# Patient Record
Sex: Female | Born: 1990 | Race: Black or African American | Hispanic: No | Marital: Single
Health system: Midwestern US, Community
[De-identification: ages and names within clinical notes are randomized; demographics above are authoritative.]

## PROBLEM LIST (undated history)

## (undated) DIAGNOSIS — F909 Attention-deficit hyperactivity disorder, unspecified type: Secondary | ICD-10-CM

## (undated) DIAGNOSIS — F609 Personality disorder, unspecified: Secondary | ICD-10-CM

## (undated) DIAGNOSIS — K5900 Constipation, unspecified: Secondary | ICD-10-CM

## (undated) DIAGNOSIS — F329 Major depressive disorder, single episode, unspecified: Secondary | ICD-10-CM

## (undated) DIAGNOSIS — F419 Anxiety disorder, unspecified: Secondary | ICD-10-CM

## (undated) DIAGNOSIS — F1910 Other psychoactive substance abuse, uncomplicated: Secondary | ICD-10-CM

## (undated) DIAGNOSIS — F99 Mental disorder, not otherwise specified: Secondary | ICD-10-CM

## (undated) DIAGNOSIS — F32A Depression, unspecified: Secondary | ICD-10-CM

## (undated) HISTORY — PX: HERNIA REPAIR: SHX51

## (undated) MED FILL — M-NATAL PLUS 27-1MG TABS: 27-1 MG | 30 days supply | Qty: 30 | Fill #0 | Status: AC

---

## 2012-05-10 DIAGNOSIS — O0990 Supervision of high risk pregnancy, unspecified, unspecified trimester: Secondary | ICD-10-CM | POA: Insufficient documentation

## 2012-05-17 DIAGNOSIS — O34599 Maternal care for other abnormalities of gravid uterus, unspecified trimester: Secondary | ICD-10-CM | POA: Insufficient documentation

## 2012-05-17 DIAGNOSIS — O99340 Other mental disorders complicating pregnancy, unspecified trimester: Secondary | ICD-10-CM | POA: Insufficient documentation

## 2012-05-17 DIAGNOSIS — Z98891 History of uterine scar from previous surgery: Secondary | ICD-10-CM | POA: Insufficient documentation

## 2012-05-17 DIAGNOSIS — O289 Unspecified abnormal findings on antenatal screening of mother: Secondary | ICD-10-CM | POA: Insufficient documentation

## 2012-05-17 DIAGNOSIS — Z8759 Personal history of other complications of pregnancy, childbirth and the puerperium: Secondary | ICD-10-CM | POA: Insufficient documentation

## 2012-08-29 DIAGNOSIS — F1410 Cocaine abuse, uncomplicated: Secondary | ICD-10-CM | POA: Insufficient documentation

## 2013-05-14 DIAGNOSIS — F4312 Post-traumatic stress disorder, chronic: Secondary | ICD-10-CM | POA: Insufficient documentation

## 2013-05-21 DIAGNOSIS — O99019 Anemia complicating pregnancy, unspecified trimester: Secondary | ICD-10-CM | POA: Insufficient documentation

## 2013-05-27 DIAGNOSIS — R87613 High grade squamous intraepithelial lesion on cytologic smear of cervix (HGSIL): Secondary | ICD-10-CM | POA: Insufficient documentation

## 2013-09-26 DIAGNOSIS — O289 Unspecified abnormal findings on antenatal screening of mother: Secondary | ICD-10-CM | POA: Insufficient documentation

## 2013-09-26 DIAGNOSIS — O0930 Supervision of pregnancy with insufficient antenatal care, unspecified trimester: Secondary | ICD-10-CM | POA: Insufficient documentation

## 2013-12-03 DIAGNOSIS — F319 Bipolar disorder, unspecified: Secondary | ICD-10-CM | POA: Insufficient documentation

## 2013-12-09 DIAGNOSIS — F332 Major depressive disorder, recurrent severe without psychotic features: Secondary | ICD-10-CM | POA: Insufficient documentation

## 2014-04-14 DIAGNOSIS — F1920 Other psychoactive substance dependence, uncomplicated: Secondary | ICD-10-CM | POA: Insufficient documentation

## 2014-05-26 ENCOUNTER — Emergency Department (HOSPITAL_COMMUNITY)
Admission: EM | Admit: 2014-05-26 | Discharge: 2014-05-26 | Disposition: A | Discharge status: Home / Self Care | Payer: Medicaid - Out of State | Attending: Emergency Medicine | Admitting: Emergency Medicine

## 2014-05-26 ENCOUNTER — Encounter (HOSPITAL_COMMUNITY): Payer: Self-pay | Admitting: Emergency Medicine

## 2014-05-26 ENCOUNTER — Emergency Department (HOSPITAL_COMMUNITY): Payer: Medicaid - Out of State

## 2014-05-26 DIAGNOSIS — H6693 Otitis media, unspecified, bilateral: Secondary | ICD-10-CM | POA: Diagnosis not present

## 2014-05-26 DIAGNOSIS — H9203 Otalgia, bilateral: Secondary | ICD-10-CM | POA: Diagnosis present

## 2014-05-26 DIAGNOSIS — J069 Acute upper respiratory infection, unspecified: Secondary | ICD-10-CM | POA: Diagnosis not present

## 2014-05-26 DIAGNOSIS — Z91040 Latex allergy status: Secondary | ICD-10-CM | POA: Insufficient documentation

## 2014-05-26 DIAGNOSIS — R058 Other specified cough: Secondary | ICD-10-CM

## 2014-05-26 DIAGNOSIS — Z720 Tobacco use: Secondary | ICD-10-CM | POA: Diagnosis not present

## 2014-05-26 DIAGNOSIS — R05 Cough: Secondary | ICD-10-CM

## 2014-05-26 MED ORDER — AMOXICILLIN-POT CLAVULANATE 875-125 MG PO TABS
1.0000 | ORAL_TABLET | Freq: Once | ORAL | Status: AC
  Administered 2014-05-26: 1 via ORAL
  Filled 2014-05-26: qty 1

## 2014-05-26 MED ORDER — ANTIPYRINE-BENZOCAINE 5.4-1.4 % OT SOLN
3.0000 [drp] | Freq: Once | OTIC | Status: AC
  Administered 2014-05-26: 3 [drp] via OTIC
  Filled 2014-05-26: qty 10

## 2014-05-26 MED ORDER — HYDROCODONE-ACETAMINOPHEN 5-325 MG PO TABS
2.0000 | ORAL_TABLET | Freq: Once | ORAL | Status: AC
  Administered 2014-05-26: 2 via ORAL
  Filled 2014-05-26: qty 2

## 2014-05-26 MED ORDER — AMOXICILLIN-POT CLAVULANATE 875-125 MG PO TABS: 1.0000 | ORAL_TABLET | Freq: Two times a day (BID) | ORAL | Status: DC

## 2014-05-26 MED ORDER — ANTIPYRINE-BENZOCAINE 5.4-1.4 % OT SOLN: 3.0000 [drp] | OTIC | Status: DC | PRN

## 2014-05-26 NOTE — ED Provider Notes (Signed)
CSN: [id number]     Arrival date & time 05/26/14  1708 History   First MD Initiated Contact with Patient 05/26/14 1817     Chief Complaint  Patient presents with  . Otalgia     (Consider location/radiation/quality/duration/timing/severity/associated sxs/prior Treatment) HPI  [name] is a 23 y.o. female without significant PMH presenting with 5 day history of cough with thick productive green mucous, rhinorrhea, sinus congestion, tactile fevers and chills with complaint of bilateral ear pain for the past three days. Pain worse in the left ear. Patient denies using qtips or anything in ear. Patient noticed green discharge from left ear yesterday. Pt denies taking anything for the pain. Pt is current smoker and denies history of asthma, COPD, DM or other medical problems. No chest pain or shortness of breath. No recent abx use or chronic ear infections.   History reviewed. No pertinent past medical history. History reviewed. No pertinent past surgical history. History reviewed. No pertinent family history. History  Substance Use Topics  . Smoking status: Current Every Day Smoker  . Smokeless tobacco: Not on file  . Alcohol Use: Yes   OB History    No data available     Review of Systems  Constitutional: Negative for fever and chills.  HENT: Positive for congestion, ear discharge, ear pain and rhinorrhea. Negative for sore throat.   Eyes: Negative for visual disturbance.  Respiratory: Positive for cough. Negative for shortness of breath.   Cardiovascular: Negative for chest pain and palpitations.  Gastrointestinal: Negative for nausea, vomiting and diarrhea.  Musculoskeletal: Negative for back pain and gait problem.  Skin: Negative for rash.  Neurological: Negative for weakness and headaches.      Allergies  Latex  Home Medications   Prior to Admission medications   Medication Sig Start Date End Date Taking? Authorizing Provider  amoxicillin-clavulanate  (AUGMENTIN) 875-125 MG per tablet Take 1 tablet by mouth every 12 (twelve) hours. 05/26/14   Louann SjogrenVictoria L Anamika Kueker, PA-C  antipyrine-benzocaine Lyla Son(AURALGAN) otic solution Place 3 drops into the right ear every 2 (two) hours as needed. 05/26/14   Benetta SparVictoria L Leocadio Heal, PA-C   BP 139/100 mmHg  Pulse 67  Temp(Src) 97.9 F (36.6 C) (Oral)  Resp 22  Ht 5\' 5"  (1.651 m)  Wt 125 lb (56.7 kg)  BMI 20.80 kg/m2  SpO2 100% Physical Exam  Constitutional: She appears well-developed and well-nourished. No distress.  HENT:  Head: Normocephalic and atraumatic.  Right Ear: External ear normal. No drainage. Tympanic membrane is injected, erythematous and bulging. Tympanic membrane is not perforated.  Left Ear: External ear normal. No drainage. Tympanic membrane is injected, erythematous and bulging. Tympanic membrane is not perforated.  Nose: Right sinus exhibits no maxillary sinus tenderness and no frontal sinus tenderness. Left sinus exhibits no maxillary sinus tenderness and no frontal sinus tenderness.  Mouth/Throat: Mucous membranes are normal. Posterior oropharyngeal erythema present. No oropharyngeal exudate or posterior oropharyngeal edema.  Tenderness to left outer ear, tragus. No tenderness to mastoid, inflammation or erythema. Bilateral ear canals with significant erythema.   Eyes: Conjunctivae and EOM are normal. Right eye exhibits no discharge. Left eye exhibits no discharge.  Neck: Normal range of motion. Neck supple.  No nuchal rigidity.  Cardiovascular: Normal rate, regular rhythm and normal heart sounds.   Pulmonary/Chest: Effort normal and breath sounds normal. No respiratory distress. She has no wheezes. She has no rales.  Abdominal: Soft. Bowel sounds are normal. She exhibits no distension. There is no tenderness.  Lymphadenopathy:  She has cervical adenopathy.  Neurological: She is alert.  Skin: Skin is warm and dry. She is not diaphoretic.  Nursing note and vitals reviewed.   ED Course   Procedures (including critical care time) Labs Review Labs Reviewed - No data to display  Imaging Review Dg Chest 2 View  05/26/2014   CLINICAL DATA:  Productive cough. Bilateral ear pain. Symptoms for 3 days.  EXAM: CHEST  2 VIEW  COMPARISON:  None.  FINDINGS: Normal heart, mediastinum and hila. Clear lungs. No pleural effusion or pneumothorax. Normal bony thorax and soft tissues.  IMPRESSION: Normal chest radiographs.   Electronically Signed   By: Amie Portlandavid  Ormond M.D.   On: 05/26/2014 19:45     EKG Interpretation None      Meds given in ED:  Medications  HYDROcodone-acetaminophen (NORCO/VICODIN) 5-325 MG per tablet 2 tablet (2 tablets Oral Given 05/26/14 1856)  amoxicillin-clavulanate (AUGMENTIN) 875-125 MG per tablet 1 tablet (1 tablet Oral Given 05/26/14 1955)  antipyrine-benzocaine (AURALGAN) otic solution 3-4 drop (3 drops Both Ears Given 05/26/14 1955)    Discharge Medication List as of 05/26/2014  8:35 PM    START taking these medications   Details  amoxicillin-clavulanate (AUGMENTIN) 875-125 MG per tablet Take 1 tablet by mouth every 12 (twelve) hours., Starting 05/26/2014, Until Discontinued, Print    antipyrine-benzocaine Lyla Son(AURALGAN) otic solution Place 3 drops into the right ear every 2 (two) hours as needed., Starting 05/26/2014, Until Discontinued, Print          MDM   Final diagnoses:  Productive cough  Acute bacterial otitis media, bilateral  URI (upper respiratory infection)   Pt with productive cough with lungs CTAB, pt afebrile and CXR without infiltrates. I suspect viral etiology of cough. Pt will be discharged with symptomatic treatment.   Patient presents with otalgia and exam consistent with acute otitis media. No concern for acute mastoiditis, meningitis.  No antibiotic use in the last month.  Patient discharged home with augmentin. Pt without a PCP. Pt to follow up with the wellness center in 2-3 days. I have also discussed reasons to return immediately to  the ER.  Parent expresses understanding and agrees with plan.  Discussed return precautions with patient. Discussed all results and patient verbalizes understanding and agrees with plan.  This is a shared patient. This patient was discussed with the physician, Dr. Loretha StaplerWofford who saw and evaluated the patient and agrees with the plan.         Louann SjogrenVictoria L Prentice Sackrider, PA-C 05/27/14 0040  Louann SjogrenVictoria L Alastor Kneale, PA-C 05/27/14 [phone]

## 2014-05-26 NOTE — Discharge Instructions (Signed)
Return to the emergency room with worsening of symptoms, new symptoms or with symptoms that are concerning, especially severe worsening of headache, stiff neck, visual or speech changes, weakness in face, arms or legs OR swelling, redness or pain around your ear.  Please take all of your antibiotics until finished!   You may develop abdominal discomfort or diarrhea from the antibiotic.  You may help offset this with probiotics which you can buy or get in yogurt. Do not eat  or take the probiotics until 2 hours after your antibiotic.  Drink plenty of fluids with electrolytes especially Gatorade. OTC cold medications such as mucinex, nyquil, dayquil are recommended. Chloraseptic for sore throat.

## 2014-05-26 NOTE — ED Notes (Signed)
Pt A&OX4, ambulatory at d/c with steady gait, NAD 

## 2014-05-26 NOTE — ED Notes (Signed)
Pt c/o bilateral ear pain x 3 days.  

## 2014-05-27 NOTE — ED Provider Notes (Signed)
Medical screening examination/treatment/procedure(s) were conducted as a shared visit with non-physician practitioner(s) and myself.  I personally evaluated the patient during the encounter.   EKG Interpretation None      23 yo female presenting with cough, URI symptoms, left ear pain.  On exam, well appearing, nontoxic, not distressed, normal respiratory effort, normal perfusion, right ear canal clear, mild right middle ear effusion, left ear canal clear, severe left middle ear effusion with TM bulging, no mastoid tenderness.  Plan symptomatic control and abx for AOM.    Clinical Impression: 1. Acute bacterial otitis media, bilateral   2. Productive cough   3. URI (upper respiratory infection)       [name] Olivia Graber, MD 05/27/14 1626

## 2014-06-25 ENCOUNTER — Inpatient Hospital Stay (HOSPITAL_COMMUNITY)
Admission: AD | Admit: 2014-06-25 | Discharge: 2014-06-25 | Disposition: A | Discharge status: Home / Self Care | Payer: Medicaid - Out of State | Source: Ambulatory Visit | Attending: Obstetrics & Gynecology | Admitting: Obstetrics & Gynecology

## 2014-06-25 ENCOUNTER — Encounter (HOSPITAL_COMMUNITY): Payer: Self-pay | Admitting: *Deleted

## 2014-06-25 DIAGNOSIS — K648 Other hemorrhoids: Secondary | ICD-10-CM | POA: Insufficient documentation

## 2014-06-25 DIAGNOSIS — K5900 Constipation, unspecified: Secondary | ICD-10-CM | POA: Insufficient documentation

## 2014-06-25 DIAGNOSIS — F17210 Nicotine dependence, cigarettes, uncomplicated: Secondary | ICD-10-CM | POA: Insufficient documentation

## 2014-06-25 HISTORY — DX: Anxiety disorder, unspecified: F41.9

## 2014-06-25 HISTORY — DX: Depression, unspecified: F32.A

## 2014-06-25 HISTORY — DX: Personality disorder, unspecified: F60.9

## 2014-06-25 HISTORY — DX: Attention-deficit hyperactivity disorder, unspecified type: F90.9

## 2014-06-25 HISTORY — DX: Constipation, unspecified: K59.00

## 2014-06-25 HISTORY — DX: Other psychoactive substance abuse, uncomplicated: F19.10

## 2014-06-25 HISTORY — DX: Major depressive disorder, single episode, unspecified: F32.9

## 2014-06-25 HISTORY — DX: Mental disorder, not otherwise specified: F99

## 2014-06-25 LAB — URINALYSIS, ROUTINE W REFLEX MICROSCOPIC
Bilirubin Urine: NEGATIVE
GLUCOSE, UA: NEGATIVE mg/dL
Hgb urine dipstick: NEGATIVE
Ketones, ur: NEGATIVE mg/dL
LEUKOCYTES UA: NEGATIVE
Nitrite: NEGATIVE
PROTEIN: NEGATIVE mg/dL
Specific Gravity, Urine: >1.03 — ABNORMAL HIGH (ref 1.005–1.030)
UROBILINOGEN UA: 0.2 mg/dL (ref 0.0–1.0)
pH: 5.5 (ref 5.0–8.0)

## 2014-06-25 LAB — CBC
HCT: 37.1 % (ref 36.0–46.0)
Hemoglobin: 12.2 g/dL (ref 12.0–15.0)
MCH: 28.4 pg (ref 26.0–34.0)
MCHC: 32.9 g/dL (ref 30.0–36.0)
MCV: 86.5 fL (ref 78.0–100.0)
Platelets: 341 10*3/uL (ref 150–400)
RBC: 4.29 MIL/uL (ref 3.87–5.11)
RDW: 14.3 % (ref 11.5–15.5)
WBC: 5.5 10*3/uL (ref 4.0–10.5)

## 2014-06-25 LAB — POCT PREGNANCY, URINE: Preg Test, Ur: NEGATIVE

## 2014-06-25 NOTE — MAU Note (Signed)
Patient states she has had pain in the left mid abdomen for about 4 days. Has been constipated for 4 days and only hard pebble size stool. States she had bright red rectal bleeding when trying to have a BM. States bleeding continues when she strains.

## 2014-06-25 NOTE — MAU Provider Note (Signed)
History     CSN: [id number]  Arrival date and time: 06/25/14 1513   First Provider Initiated Contact with Patient 06/25/14 1658      Chief Complaint  Patient presents with  . Abdominal Pain  . Rectal Bleeding   HPI    [name] is a 23 y.o. female [phone] who presents to MAU for constipation and rectal bleeding. She had a BM four days ago and it was soft, she is concerned because she has not been able to go since then.  When she used the bathroom today she saw blood after she wiped from her rectum.  She drinks sprite all day every day, she does not drink water.   OB History    Gravida Para Term Preterm AB TAB SAB Ectopic Multiple Living   3 3  3      3       Past Medical History  Diagnosis Date  . Constipation   . Anxiety   . Mental disorder   . ADHD (attention deficit hyperactivity disorder)   . Personality disorder   . Depression   . Anxiety disorder   . Substance abuse     Past Surgical History  Procedure Laterality Date  . Cesarean section    . Hernia repair      Family History  Problem Relation Age of Onset  . Alcohol abuse Neg Hx   . Arthritis Neg Hx   . Asthma Neg Hx   . Birth defects Neg Hx   . Cancer Neg Hx   . COPD Neg Hx   . Depression Neg Hx   . Diabetes Neg Hx   . Drug abuse Neg Hx   . Early death Neg Hx   . Hearing loss Neg Hx   . Heart disease Neg Hx   . Hyperlipidemia Neg Hx   . Hypertension Neg Hx   . Kidney disease Neg Hx   . Learning disabilities Neg Hx   . Mental illness Neg Hx   . Mental retardation Neg Hx   . Miscarriages / Stillbirths Neg Hx   . Stroke Neg Hx   . Vision loss Neg Hx   . Varicose Veins Neg Hx     History  Substance Use Topics  . Smoking status: Current Every Day Smoker  . Smokeless tobacco: Not on file  . Alcohol Use: Yes    Allergies:  Allergies  Allergen Reactions  . Latex Hives    Prescriptions prior to admission  Medication Sig Dispense Refill Last Dose  . etonogestrel (NEXPLANON)  68 MG IMPL implant 1 each by Subdermal route once.   06/25/2014 at Unknown time  . traZODone (DESYREL) 100 MG tablet Take 100 mg by mouth at bedtime.   06/24/2014 at Unknown time  . amoxicillin-clavulanate (AUGMENTIN) 875-125 MG per tablet Take 1 tablet by mouth every 12 (twelve) hours. (Patient not taking: Reported on 06/25/2014) 20 tablet 0   . antipyrine-benzocaine (AURALGAN) otic solution Place 3 drops into the right ear every 2 (two) hours as needed. (Patient not taking: Reported on 06/25/2014) 10 mL 0    Results for orders placed or performed during the hospital encounter of 06/25/14 (from the past 48 hour(s))  Pregnancy, urine POC     Status: None   Collection Time: 06/25/14  4:13 PM  Result Value Ref Range   Preg Test, Ur NEGATIVE NEGATIVE    Comment:        THE SENSITIVITY OF THIS METHODOLOGY IS >24 mIU/mL  Urinalysis, Routine w reflex microscopic     Status: Abnormal   Collection Time: 06/25/14  4:15 PM  Result Value Ref Range   Color, Urine YELLOW YELLOW   APPearance HAZY (A) CLEAR   Specific Gravity, Urine >1.030 (H) 1.005 - 1.030   pH 5.5 5.0 - 8.0   Glucose, UA NEGATIVE NEGATIVE mg/dL   Hgb urine dipstick NEGATIVE NEGATIVE   Bilirubin Urine NEGATIVE NEGATIVE   Ketones, ur NEGATIVE NEGATIVE mg/dL   Protein, ur NEGATIVE NEGATIVE mg/dL   Urobilinogen, UA 0.2 0.0 - 1.0 mg/dL   Nitrite NEGATIVE NEGATIVE   Leukocytes, UA NEGATIVE NEGATIVE    Comment: MICROSCOPIC NOT DONE ON URINES WITH NEGATIVE PROTEIN, BLOOD, LEUKOCYTES, NITRITE, OR GLUCOSE <1000 mg/dL.  CBC     Status: None   Collection Time: 06/25/14  5:26 PM  Result Value Ref Range   WBC 5.5 4.0 - 10.5 K/uL   RBC 4.29 3.87 - 5.11 MIL/uL   Hemoglobin 12.2 12.0 - 15.0 g/dL   HCT 16.137.1 09.636.0 - 04.546.0 %   MCV 86.5 78.0 - 100.0 fL   MCH 28.4 26.0 - 34.0 pg   MCHC 32.9 30.0 - 36.0 g/dL   RDW 40.914.3 81.111.5 - 91.415.5 %   Platelets 341 150 - 400 K/uL    Review of Systems  Constitutional: Negative for fever and chills.   Gastrointestinal: Positive for constipation and blood in stool. Negative for nausea, vomiting and abdominal pain.  Genitourinary: Negative for dysuria, urgency, frequency and hematuria.   Physical Exam   Blood pressure 118/66, pulse 90, temperature 98.1 F (36.7 C), temperature source Oral, resp. rate 16, height 5\' 6"  (1.676 m), weight 51.619 kg (113 lb 12.8 oz), SpO2 99 %.  Physical Exam  Constitutional: She is oriented to person, place, and time. She appears well-developed and well-nourished. No distress.  HENT:  Head: Normocephalic.  Neck: Neck supple.  Respiratory: Effort normal.  GI: Soft. She exhibits no distension and no mass. There is no tenderness. There is no rebound and no guarding.  Genitourinary: Rectal exam shows internal hemorrhoid and tenderness. Rectal exam shows no external hemorrhoid and anal tone normal.  No blood noted on exam glove. Large amount of stool palpated   Musculoskeletal: Normal range of motion.  Neurological: She is alert and oriented to person, place, and time.  Skin: Skin is warm. She is not diaphoretic.  Psychiatric: Her behavior is normal.    MAU Course  Procedures  None  MDM UA UPT Rectal exam   Assessment and Plan   A: 1. Constipation, unspecified constipation type   2.      Internal hemorrhoid  P: Discharge home in stable condition Increase PO water intake Increase fiber in diet Over the counter: Miralax, metamucil, colace as directed on the bottle [facility] wor[facility]     Iona HansenJennifer Irene Tippi Mccrae, NP 06/25/2014 7:58 PM

## 2014-06-25 NOTE — Discharge Instructions (Signed)
Constipation °Constipation is when a person: °· Poops (has a bowel movement) less than 3 times a week. °· Has a hard time pooping. °· Has poop that is dry, hard, or bigger than normal. °HOME CARE  °· Eat foods with a lot of fiber in them. This includes fruits, vegetables, beans, and whole grains such as brown rice. °· Avoid fatty foods and foods with a lot of sugar. This includes french fries, hamburgers, cookies, candy, and soda. °· If you are not getting enough fiber from food, take products with added fiber in them (supplements). °· Drink enough fluid to keep your pee (urine) clear or pale yellow. °· Exercise on a regular basis, or as told by your doctor. °· Go to the restroom when you feel like you need to poop. Do not hold it. °· Only take medicine as told by your doctor. Do not take medicines that help you poop (laxatives) without talking to your doctor first. °GET HELP RIGHT AWAY IF:  °· You have bright red blood in your poop (stool). °· Your constipation lasts more than 4 days or gets worse. °· You have belly (abdominal) or butt (rectal) pain. °· You have thin poop (as thin as a pencil). °· You lose weight, and it cannot be explained. °MAKE SURE YOU:  °· Understand these instructions. °· Will watch your condition. °· Will get help right away if you are not doing well or get worse. °Document Released: 12/28/2007 Document Revised: 07/16/2013 Document Reviewed: 04/22/2013 °[employer]® Patient Information ©2015 [employer]. This information is not intended to replace advice given to you by your health care provider. Make sure you discuss any questions you have with your health care provider. ° °Anal Fissure, Adult °An anal fissure is a small tear or crack in the skin around the opening of the butt (anus). Bleeding from the tear or crack usually stops on its own within a few minutes. The bleeding may happen every time you poop until the tear or crack heals.  °HOME CARE °· Eat lots of fruit, whole grains, and  vegetables. Avoid foods like bananas and dairy products. These foods can make it hard to poop. °· Take a warm water bath (sitz bath) as told by your doctor. °· Drink enough fluids to keep your pee (urine) clear or pale yellow. °· Only take medicines as told by your doctor. Do not take aspirin. °· Do not use numbing creams or hydrocortisone cream on the area. These creams can slow healing. °GET HELP RIGHT AWAY IF: °· Your tear or crack is not healed in 3 days. °· You have more bleeding. °· You have a fever. °· You have watery poop (diarrhea) mixed with blood. °· You have pain. °· You are getting worse, not better. °MAKE SURE YOU:  °· Understand these instructions. °· Will watch your condition. °· Will get help right away if you are not doing well or get worse. °Document Released: 03/09/2011 Document Revised: 10/03/2011 Document Reviewed: 03/09/2011 °[employer]® Patient Information ©2015 [employer]. This information is not intended to replace advice given to you by your health care provider. Make sure you discuss any questions you have with your health care provider. ° °

## 2014-07-05 ENCOUNTER — Emergency Department (HOSPITAL_COMMUNITY): Payer: Medicaid - Out of State

## 2014-07-05 ENCOUNTER — Encounter (HOSPITAL_COMMUNITY): Payer: Self-pay | Admitting: Emergency Medicine

## 2014-07-05 ENCOUNTER — Observation Stay (HOSPITAL_COMMUNITY)
Admission: EM | Admit: 2014-07-05 | Discharge: 2014-07-09 | Disposition: A | Discharge status: Home / Self Care | Payer: Medicaid - Out of State | Attending: Internal Medicine | Admitting: Internal Medicine

## 2014-07-05 DIAGNOSIS — F609 Personality disorder, unspecified: Secondary | ICD-10-CM | POA: Insufficient documentation

## 2014-07-05 DIAGNOSIS — F329 Major depressive disorder, single episode, unspecified: Secondary | ICD-10-CM | POA: Insufficient documentation

## 2014-07-05 DIAGNOSIS — K6289 Other specified diseases of anus and rectum: Secondary | ICD-10-CM | POA: Insufficient documentation

## 2014-07-05 DIAGNOSIS — R1030 Lower abdominal pain, unspecified: Secondary | ICD-10-CM | POA: Diagnosis present

## 2014-07-05 DIAGNOSIS — K625 Hemorrhage of anus and rectum: Secondary | ICD-10-CM | POA: Diagnosis present

## 2014-07-05 DIAGNOSIS — K626 Ulcer of anus and rectum: Secondary | ICD-10-CM | POA: Diagnosis not present

## 2014-07-05 DIAGNOSIS — K922 Gastrointestinal hemorrhage, unspecified: Principal | ICD-10-CM | POA: Diagnosis present

## 2014-07-05 DIAGNOSIS — F17200 Nicotine dependence, unspecified, uncomplicated: Secondary | ICD-10-CM | POA: Insufficient documentation

## 2014-07-05 DIAGNOSIS — K5900 Constipation, unspecified: Secondary | ICD-10-CM | POA: Diagnosis present

## 2014-07-05 DIAGNOSIS — F419 Anxiety disorder, unspecified: Secondary | ICD-10-CM | POA: Insufficient documentation

## 2014-07-05 DIAGNOSIS — K921 Melena: Secondary | ICD-10-CM | POA: Insufficient documentation

## 2014-07-05 DIAGNOSIS — N390 Urinary tract infection, site not specified: Secondary | ICD-10-CM | POA: Diagnosis not present

## 2014-07-05 DIAGNOSIS — D62 Acute posthemorrhagic anemia: Secondary | ICD-10-CM | POA: Diagnosis present

## 2014-07-05 DIAGNOSIS — E876 Hypokalemia: Secondary | ICD-10-CM | POA: Insufficient documentation

## 2014-07-05 DIAGNOSIS — R109 Unspecified abdominal pain: Secondary | ICD-10-CM

## 2014-07-05 LAB — CBC WITH DIFFERENTIAL/PLATELET
Basophils Absolute: 0.1 10*3/uL (ref 0.0–0.1)
Basophils Relative: 1 % (ref 0–1)
Eosinophils Absolute: 0.2 10*3/uL (ref 0.0–0.7)
Eosinophils Relative: 2 % (ref 0–5)
HCT: 33.5 % — ABNORMAL LOW (ref 36.0–46.0)
HEMOGLOBIN: 10.8 g/dL — AB (ref 12.0–15.0)
LYMPHS ABS: 2.8 10*3/uL (ref 0.7–4.0)
Lymphocytes Relative: 34 % (ref 12–46)
MCH: 27.6 pg (ref 26.0–34.0)
MCHC: 32.2 g/dL (ref 30.0–36.0)
MCV: 85.7 fL (ref 78.0–100.0)
Monocytes Absolute: 0.6 10*3/uL (ref 0.1–1.0)
Monocytes Relative: 7 % (ref 3–12)
NEUTROS ABS: 4.7 10*3/uL (ref 1.7–7.7)
Neutrophils Relative %: 56 % (ref 43–77)
PLATELETS: 362 10*3/uL (ref 150–400)
RBC: 3.91 MIL/uL (ref 3.87–5.11)
RDW: 15 % (ref 11.5–15.5)
WBC: 8.3 10*3/uL (ref 4.0–10.5)

## 2014-07-05 LAB — URINALYSIS, ROUTINE W REFLEX MICROSCOPIC
Glucose, UA: NEGATIVE mg/dL
Ketones, ur: 15 mg/dL — AB
Nitrite: NEGATIVE
Protein, ur: 30 mg/dL — AB
Specific Gravity, Urine: 1.03 (ref 1.005–1.030)
UROBILINOGEN UA: 0.2 mg/dL (ref 0.0–1.0)
pH: 5.5 (ref 5.0–8.0)

## 2014-07-05 LAB — COMPREHENSIVE METABOLIC PANEL
ALK PHOS: 109 U/L (ref 39–117)
ALT: 12 U/L (ref 0–35)
AST: 19 U/L (ref 0–37)
Albumin: 3.7 g/dL (ref 3.5–5.2)
Anion gap: 14 (ref 5–15)
BILIRUBIN TOTAL: 0.5 mg/dL (ref 0.3–1.2)
BUN: 7 mg/dL (ref 6–23)
CHLORIDE: 101 meq/L (ref 96–112)
CO2: 24 meq/L (ref 19–32)
Calcium: 9 mg/dL (ref 8.4–10.5)
Creatinine, Ser: 0.78 mg/dL (ref 0.50–1.10)
GLUCOSE: 90 mg/dL (ref 70–99)
POTASSIUM: 4 meq/L (ref 3.7–5.3)
SODIUM: 139 meq/L (ref 137–147)
Total Protein: 7.3 g/dL (ref 6.0–8.3)

## 2014-07-05 LAB — URINE MICROSCOPIC-ADD ON

## 2014-07-05 LAB — POC URINE PREG, ED: Preg Test, Ur: NEGATIVE

## 2014-07-05 LAB — POC OCCULT BLOOD, ED: FECAL OCCULT BLD: POSITIVE — AB

## 2014-07-05 MED ORDER — SODIUM CHLORIDE 0.9 % IV SOLN
INTRAVENOUS | Status: DC
  Administered 2014-07-06 (×2): via INTRAVENOUS

## 2014-07-05 MED ORDER — HYDROCODONE-ACETAMINOPHEN 5-325 MG PO TABS
1.0000 | ORAL_TABLET | Freq: Four times a day (QID) | ORAL | Status: DC | PRN
  Administered 2014-07-05 – 2014-07-06 (×2): 1 via ORAL
  Filled 2014-07-05 (×2): qty 1

## 2014-07-05 MED ORDER — TRAZODONE HCL 100 MG PO TABS
100.0000 mg | ORAL_TABLET | Freq: Every day | ORAL | Status: DC
  Administered 2014-07-06 – 2014-07-08 (×3): 100 mg via ORAL
  Filled 2014-07-05 (×4): qty 1

## 2014-07-05 MED ORDER — SODIUM CHLORIDE 0.9 % IJ SOLN: 3.0000 mL | Freq: Two times a day (BID) | INTRAMUSCULAR | Status: DC

## 2014-07-05 MED ORDER — HYDROMORPHONE HCL 1 MG/ML IJ SOLN
1.0000 mg | Freq: Once | INTRAMUSCULAR | Status: AC
  Administered 2014-07-05: 1 mg via INTRAVENOUS
  Filled 2014-07-05: qty 1

## 2014-07-05 MED ORDER — ONDANSETRON HCL 4 MG/2ML IJ SOLN
4.0000 mg | Freq: Once | INTRAMUSCULAR | Status: AC
  Administered 2014-07-05: 4 mg via INTRAVENOUS
  Filled 2014-07-05: qty 2

## 2014-07-05 MED ORDER — IOHEXOL 300 MG/ML  SOLN
100.0000 mL | Freq: Once | INTRAMUSCULAR | Status: AC | PRN
  Administered 2014-07-05: 100 mL via INTRAVENOUS

## 2014-07-05 MED ORDER — SODIUM CHLORIDE 0.9 % IV SOLN: INTRAVENOUS | Status: AC

## 2014-07-05 NOTE — ED Notes (Signed)
Pt ambulates with ease in the room.

## 2014-07-05 NOTE — ED Notes (Signed)
Pt c/o lower abd pain x several days with possible dark red stool x 1 episode last night

## 2014-07-05 NOTE — ED Provider Notes (Addendum)
CSN: [id number]     Arrival date & time 07/05/14  1351 History   First MD Initiated Contact with Patient 07/05/14 1639     Chief Complaint  Patient presents with  . Abdominal Pain     (Consider location/radiation/quality/duration/timing/severity/associated sxs/prior Treatment) Patient is a 23 y.o. female presenting with abdominal pain. The history is provided by the patient.  Abdominal Pain Associated symptoms: no chest pain, no chills, no cough, no diarrhea, no fever, no shortness of breath, no vaginal bleeding, no vaginal discharge and no vomiting   pt w hx hemorrhoids, c/o rectal bleeding intermittently in the past 1-2 weeks. Episodic. States bms will be from bright red to very dark. States also thinks she may have passed some clots and/or bloody tissue per rectum. No vaginal bleeding. Unsure of lnmp since implanted birth control approximately 1 year ago. Pt c/o left lower abdominal pain, crampy, moderate. No hx same.  Normal appetite. No nv. No hx pud, diverticula, avm, or other cause of rectal bleeding. No recent rectal trauma. Denies fever or chills. No diarrhea. No other abn bleeding or bruising. No syncope, no faintness or dizziness.       Past Medical History  Diagnosis Date  . Constipation   . Anxiety   . Mental disorder   . ADHD (attention deficit hyperactivity disorder)   . Personality disorder   . Depression   . Anxiety disorder   . Substance abuse    Past Surgical History  Procedure Laterality Date  . Cesarean section    . Hernia repair     Family History  Problem Relation Age of Onset  . Alcohol abuse Neg Hx   . Arthritis Neg Hx   . Asthma Neg Hx   . Birth defects Neg Hx   . Cancer Neg Hx   . COPD Neg Hx   . Depression Neg Hx   . Diabetes Neg Hx   . Drug abuse Neg Hx   . Early death Neg Hx   . Hearing loss Neg Hx   . Heart disease Neg Hx   . Hyperlipidemia Neg Hx   . Hypertension Neg Hx   . Kidney disease Neg Hx   . Learning disabilities Neg Hx   .  Mental illness Neg Hx   . Mental retardation Neg Hx   . Miscarriages / Stillbirths Neg Hx   . Stroke Neg Hx   . Vision loss Neg Hx   . Varicose Veins Neg Hx    History  Substance Use Topics  . Smoking status: Current Every Day Smoker  . Smokeless tobacco: Not on file  . Alcohol Use: Yes   OB History    Gravida Para Term Preterm AB TAB SAB Ectopic Multiple Living   3 3  3      3      Review of Systems  Constitutional: Negative for fever and chills.  HENT: Negative for nosebleeds.   Eyes: Negative for visual disturbance.  Respiratory: Negative for cough and shortness of breath.   Cardiovascular: Negative for chest pain and leg swelling.  Gastrointestinal: Positive for abdominal pain and blood in stool. Negative for vomiting and diarrhea.  Genitourinary: Negative for flank pain, vaginal bleeding and vaginal discharge.  Musculoskeletal: Negative for back pain and neck pain.  Skin: Negative for rash.  Neurological: Negative for syncope and light-headedness.  Hematological: Does not bruise/bleed easily.  Psychiatric/Behavioral: Negative for confusion.      Allergies  Latex  Home Medications   Prior to Admission  medications   Medication Sig Start Date End Date Taking? Authorizing Provider  etonogestrel (NEXPLANON) 68 MG IMPL implant 1 each by Subdermal route once.    Historical Provider, MD  traZODone (DESYREL) 100 MG tablet Take 100 mg by mouth at bedtime.    Historical Provider, MD   BP 125/75 mmHg  Pulse 83  Temp(Src) 98.3 F (36.8 C) (Oral)  Resp 17  SpO2 100% Physical Exam  Constitutional: She appears well-developed and well-nourished. No distress.  HENT:  Mouth/Throat: Oropharynx is clear and moist.  Eyes: Conjunctivae are normal. No scleral icterus.  Neck: Neck supple. No tracheal deviation present.  Cardiovascular: Normal rate, regular rhythm, normal heart sounds and intact distal pulses.  Exam reveals no gallop and no friction rub.   No murmur  heard. Pulmonary/Chest: Effort normal and breath sounds normal. No respiratory distress.  Abdominal: Soft. Normal appearance and bowel sounds are normal. She exhibits no distension and no mass. There is tenderness. There is no rebound and no guarding.  Moderate left abd pain  Genitourinary:  Rectal, ?internal hem. No stool/impaction, small amt dark blood/heme pos.   Musculoskeletal: She exhibits no edema or tenderness.  Neurological: She is alert.  Skin: Skin is warm and dry. No rash noted. She is not diaphoretic.  Psychiatric: She has a normal mood and affect.  Nursing note and vitals reviewed.   ED Course  Procedures (including critical care time) Labs Review  Results for orders placed or performed during the hospital encounter of 07/05/14  CBC with Differential  Result Value Ref Range   WBC 8.3 4.0 - 10.5 K/uL   RBC 3.91 3.87 - 5.11 MIL/uL   Hemoglobin 10.8 (L) 12.0 - 15.0 g/dL   HCT 16.1 (L) 09.6 - 04.5 %   MCV 85.7 78.0 - 100.0 fL   MCH 27.6 26.0 - 34.0 pg   MCHC 32.2 30.0 - 36.0 g/dL   RDW 40.9 81.1 - 91.4 %   Platelets 362 150 - 400 K/uL   Neutrophils Relative % 56 43 - 77 %   Neutro Abs 4.7 1.7 - 7.7 K/uL   Lymphocytes Relative 34 12 - 46 %   Lymphs Abs 2.8 0.7 - 4.0 K/uL   Monocytes Relative 7 3 - 12 %   Monocytes Absolute 0.6 0.1 - 1.0 K/uL   Eosinophils Relative 2 0 - 5 %   Eosinophils Absolute 0.2 0.0 - 0.7 K/uL   Basophils Relative 1 0 - 1 %   Basophils Absolute 0.1 0.0 - 0.1 K/uL  Comprehensive metabolic panel  Result Value Ref Range   Sodium 139 137 - 147 mEq/L   Potassium 4.0 3.7 - 5.3 mEq/L   Chloride 101 96 - 112 mEq/L   CO2 24 19 - 32 mEq/L   Glucose, Bld 90 70 - 99 mg/dL   BUN 7 6 - 23 mg/dL   Creatinine, Ser 7.82 0.50 - 1.10 mg/dL   Calcium 9.0 8.4 - 95.6 mg/dL   Total Protein 7.3 6.0 - 8.3 g/dL   Albumin 3.7 3.5 - 5.2 g/dL   AST 19 0 - 37 U/L   ALT 12 0 - 35 U/L   Alkaline Phosphatase 109 39 - 117 U/L   Total Bilirubin 0.5 0.3 - 1.2 mg/dL    GFR calc non Af Amer >90 >90 mL/min   GFR calc Af Amer >90 >90 mL/min   Anion gap 14 5 - 15  Urinalysis, Routine w reflex microscopic  Result Value Ref Range   Color,  Urine AMBER (A) YELLOW   APPearance CLOUDY (A) CLEAR   Specific Gravity, Urine 1.030 1.005 - 1.030   pH 5.5 5.0 - 8.0   Glucose, UA NEGATIVE NEGATIVE mg/dL   Hgb urine dipstick TRACE (A) NEGATIVE   Bilirubin Urine SMALL (A) NEGATIVE   Ketones, ur 15 (A) NEGATIVE mg/dL   Protein, ur 30 (A) NEGATIVE mg/dL   Urobilinogen, UA 0.2 0.0 - 1.0 mg/dL   Nitrite NEGATIVE NEGATIVE   Leukocytes, UA MODERATE (A) NEGATIVE  Urine microscopic-add on  Result Value Ref Range   Squamous Epithelial / LPF MANY (A) RARE   WBC, UA 7-10 <3 WBC/hpf   RBC / HPF 0-2 <3 RBC/hpf   Urine-Other MUCOUS PRESENT   POC Urine Pregnancy, ED  (If Pre-menopausal female) - [facility]  Result Value Ref Range   Preg Test, Ur NEGATIVE NEGATIVE  POC occult blood, ED Provider will collect  Result Value Ref Range   Fecal Occult Bld POSITIVE (A) NEGATIVE   Ct Abdomen Pelvis W Contrast  07/05/2014   CLINICAL DATA:  Left lower quadrant pain radiating to right lower quadrant 5 weeks. Vomiting yesterday with bloody stool. Previous hernia repair.  EXAM: CT ABDOMEN AND PELVIS WITH CONTRAST  TECHNIQUE: Multidetector CT imaging of the abdomen and pelvis was performed using the standard protocol following bolus administration of intravenous contrast.  CONTRAST:  100mL OMNIPAQUE IOHEXOL 300 MG/ML  SOLN  COMPARISON:  None.  FINDINGS: Lung bases are within normal.  Abdominal images demonstrate a normal liver, spleen, pancreas, gallbladder and adrenal glands. Kidneys are normal in size without hydronephrosis or nephrolithiasis. Appendix is normal. There is no free fluid or focal inflammatory change. Small bowel is unremarkable.  Pelvic images demonstrate the uterus, ovaries and bladder to be within normal. Minimal rectal wall thickening as cannot exclude mild proctitis.  No adjacent inflammatory change or free fluid. 1.6 cm cyst along the posterior lateral left vaginal wall likely a Bartholin's gland cyst.  IMPRESSION: Mild rectal wall thickening which may be due to mild proctitis. No adjacent inflammatory change or free fluid.   Electronically Signed   By: Elberta Fortisaniel  Boyle M.D.   On: 07/05/2014 21:47        MDM   Iv ns. Labs.  Reviewed nursing notes and prior charts for additional history.    Recheck persistent abd pain and tenderness. Will get ct.  On labs, drop in hgb 1.5 gm in past 10 days (hct dec 4).  Pt markedly orthostatic, w drop sbg of 30, and becoming near syncopal.  Additional ivf.  Ct w possible proctitis. ?possible int hem on exam.   Given orthostasis, drop in hgb, heme positive stools, will admit to med service for obs.  Will also consult gi.   Discussed pt with Dr Matthias HughsBuccini - he will see/consult on in AM.  Request npo past mn in event procedure tomorrow.  hospitalist paged for admission.      Suzi RootsKevin E Mercy Malena, MD 07/05/14 [phone]

## 2014-07-05 NOTE — H&P (Signed)
[facility] History and Physical  [name] YNW:[id number] DOB: 06/12/1991 DOA: 07/05/2014  Referring physician: EDP PCP: No primary care provider on file.   Chief Complaint: Rectal bleeding   HPI: [name] is a 23 y.o. female who presents to the ED with c/o dark blood per rectum for the past 10 days.  10 days ago she was seen in the ED for constipation.  She states she may have passed some clots or bloody tissue per rectum.  No vaginal bleeding.  On implanted birth control for past 1 year.  No rectal trauma.  Review of Systems: Systems reviewed.  As above, otherwise negative  Past Medical History  Diagnosis Date  . Constipation   . Anxiety   . Mental disorder   . ADHD (attention deficit hyperactivity disorder)   . Personality disorder   . Depression   . Anxiety disorder   . Substance abuse    Past Surgical History  Procedure Laterality Date  . Cesarean section    . Hernia repair     Social History:  reports that she has been smoking.  She does not have any smokeless tobacco history on file. She reports that she drinks alcohol. She reports that she uses illicit drugs (Cocaine and Marijuana).  Allergies  Allergen Reactions  . Latex Hives    Family History  Problem Relation Age of Onset  . Alcohol abuse Neg Hx   . Arthritis Neg Hx   . Asthma Neg Hx   . Birth defects Neg Hx   . Cancer Neg Hx   . COPD Neg Hx   . Depression Neg Hx   . Diabetes Neg Hx   . Drug abuse Neg Hx   . Early death Neg Hx   . Hearing loss Neg Hx   . Heart disease Neg Hx   . Hyperlipidemia Neg Hx   . Hypertension Neg Hx   . Kidney disease Neg Hx   . Learning disabilities Neg Hx   . Mental illness Neg Hx   . Mental retardation Neg Hx   . Miscarriages / Stillbirths Neg Hx   . Stroke Neg Hx   . Vision loss Neg Hx   . Varicose Veins Neg Hx      Prior to Admission medications   Medication Sig Start Date End Date Taking? Authorizing Provider  traZODone (DESYREL) 100 MG  tablet Take 100 mg by mouth at bedtime.   Yes Historical Provider, MD  etonogestrel (NEXPLANON) 68 MG IMPL implant 1 each by Subdermal route once. 02/2013    Historical Provider, MD   Physical Exam: Filed Vitals:   07/05/14 2100  BP: 127/77  Pulse: 62  Temp:   Resp: 17    BP 127/77 mmHg  Pulse 62  Temp(Src) 99.1 F (37.3 C) (Oral)  Resp 17  SpO2 98%  General Appearance:    Alert, oriented, no distress, appears stated age  Head:    Normocephalic, atraumatic  Eyes:    PERRL, EOMI, sclera non-icteric        Nose:   Nares without drainage or epistaxis. Mucosa, turbinates normal  Throat:   Moist mucous membranes. Oropharynx without erythema or exudate.  Neck:   Supple. No carotid bruits.  No thyromegaly.  No lymphadenopathy.   Back:     No CVA tenderness, no spinal tenderness  Lungs:     Clear to auscultation bilaterally, without wheezes, rhonchi or rales  Chest wall:    No tenderness to palpitation  Heart:  Regular rate and rhythm without murmurs, gallops, rubs  Abdomen:     Soft, non-tender, nondistended, normal bowel sounds, no organomegaly  Genitalia:    deferred  Rectal:    deferred  Extremities:   No clubbing, cyanosis or edema.  Pulses:   2+ and symmetric all extremities  Skin:   Skin color, texture, turgor normal, no rashes or lesions  Lymph nodes:   Cervical, supraclavicular, and axillary nodes normal  Neurologic:   CNII-XII intact. Normal strength, sensation and reflexes      throughout    Labs on Admission:  Basic Metabolic Panel:  Recent Labs Lab 07/05/14 1519  NA 139  K 4.0  CL 101  CO2 24  GLUCOSE 90  BUN 7  CREATININE 0.78  CALCIUM 9.0   Liver Function Tests:  Recent Labs Lab 07/05/14 1519  AST 19  ALT 12  ALKPHOS 109  BILITOT 0.5  PROT 7.3  ALBUMIN 3.7   No results for input(s): LIPASE, AMYLASE in the last 168 hours. No results for input(s): AMMONIA in the last 168 hours. CBC:  Recent Labs Lab 07/05/14 1519  WBC 8.3  NEUTROABS  4.7  HGB 10.8*  HCT 33.5*  MCV 85.7  PLT 362   Cardiac Enzymes: No results for input(s): CKTOTAL, CKMB, CKMBINDEX, TROPONINI in the last 168 hours.  BNP (last 3 results) No results for input(s): PROBNP in the last 8760 hours. CBG: No results for input(s): GLUCAP in the last 168 hours.  Radiological Exams on Admission: Ct Abdomen Pelvis W Contrast  07/05/2014   CLINICAL DATA:  Left lower quadrant pain radiating to right lower quadrant 5 weeks. Vomiting yesterday with bloody stool. Previous hernia repair.  EXAM: CT ABDOMEN AND PELVIS WITH CONTRAST  TECHNIQUE: Multidetector CT imaging of the abdomen and pelvis was performed using the standard protocol following bolus administration of intravenous contrast.  CONTRAST:  100mL OMNIPAQUE IOHEXOL 300 MG/ML  SOLN  COMPARISON:  None.  FINDINGS: Lung bases are within normal.  Abdominal images demonstrate a normal liver, spleen, pancreas, gallbladder and adrenal glands. Kidneys are normal in size without hydronephrosis or nephrolithiasis. Appendix is normal. There is no free fluid or focal inflammatory change. Small bowel is unremarkable.  Pelvic images demonstrate the uterus, ovaries and bladder to be within normal. Minimal rectal wall thickening as cannot exclude mild proctitis. No adjacent inflammatory change or free fluid. 1.6 cm cyst along the posterior lateral left vaginal wall likely a Bartholin's gland cyst.  IMPRESSION: Mild rectal wall thickening which may be due to mild proctitis. No adjacent inflammatory change or free fluid.   Electronically Signed   By: Elberta Fortisaniel  Boyle M.D.   On: 07/05/2014 21:47    EKG: Independently reviewed.  Assessment/Plan Principal Problem:   GI bleed Active Problems:   Acute blood loss anemia   Rectal bleeding   Rectal bleed   1. GI bleed - rectal bleeding, suspect may be related to constipation patient was just treated for 10 days ago, given patients age diverticular bleed or neoplasm seem very unlikely.  Dr.  Matthias HughsBuccini called and will evaluate patient tomorrow, he asks that she be kept NPO.  Will put patient on NS.  Tele monitor, NPO after midnight.    Code Status: Full Code  Family Communication: Husband at bedside Disposition Plan: Admit to obs   Time spent: 70 min  Marilee Ditommaso M. [facility] Pager [phone]  If 7AM-7PM, please contact the day team taking care of the patient [email or web address] Password [email or web address] 07/05/2014, 11:22  PM        

## 2014-07-05 NOTE — ED Notes (Signed)
Pt remains monitored by blood pressure, pulse ox, and 5 lead.  

## 2014-07-05 NOTE — ED Notes (Signed)
Notified CT patient finished contrast.

## 2014-07-05 NOTE — ED Notes (Signed)
Pt was able to complete orthostatic vital signs. Pt stated she felt like she was going to faint while standing.

## 2014-07-05 NOTE — ED Notes (Signed)
Pt reports small amounts of bright red blood in feces beginning about 3 weeks ago (before Thanksgiving).  Reports this morning "looked like I was having a miscarriage".  Pt c/o abdominal pain LLQ and rectal pain 10/10.

## 2014-07-05 NOTE — ED Notes (Signed)
Assisted Dr. Arizona ConstableStienl with rectal exam. Pt remains monitored by blood pressure, pulse ox, and 5 lead. Pt informed that a urine specimen was needed, specimen cup placed at bedside.

## 2014-07-06 ENCOUNTER — Encounter (HOSPITAL_COMMUNITY)
Admission: EM | Disposition: A | Discharge status: Home / Self Care | Payer: Self-pay | Source: Home / Self Care | Attending: Emergency Medicine

## 2014-07-06 ENCOUNTER — Encounter (HOSPITAL_COMMUNITY): Payer: Self-pay | Admitting: Emergency Medicine

## 2014-07-06 DIAGNOSIS — K5909 Other constipation: Secondary | ICD-10-CM | POA: Diagnosis not present

## 2014-07-06 DIAGNOSIS — D62 Acute posthemorrhagic anemia: Secondary | ICD-10-CM

## 2014-07-06 DIAGNOSIS — K626 Ulcer of anus and rectum: Secondary | ICD-10-CM | POA: Diagnosis not present

## 2014-07-06 DIAGNOSIS — K625 Hemorrhage of anus and rectum: Secondary | ICD-10-CM | POA: Diagnosis not present

## 2014-07-06 DIAGNOSIS — K5900 Constipation, unspecified: Secondary | ICD-10-CM | POA: Diagnosis not present

## 2014-07-06 HISTORY — PX: FLEXIBLE SIGMOIDOSCOPY: SHX5431

## 2014-07-06 LAB — CBC
HCT: 28.6 % — ABNORMAL LOW (ref 36.0–46.0)
HCT: 31.4 % — ABNORMAL LOW (ref 36.0–46.0)
HEMOGLOBIN: 9.4 g/dL — AB (ref 12.0–15.0)
Hemoglobin: 10.1 g/dL — ABNORMAL LOW (ref 12.0–15.0)
MCH: 28.3 pg (ref 26.0–34.0)
MCH: 27.9 pg (ref 26.0–34.0)
MCHC: 32.9 g/dL (ref 30.0–36.0)
MCHC: 32.2 g/dL (ref 30.0–36.0)
MCV: 86.1 fL (ref 78.0–100.0)
MCV: 86.7 fL (ref 78.0–100.0)
PLATELETS: 297 10*3/uL (ref 150–400)
Platelets: 288 10*3/uL (ref 150–400)
RBC: 3.32 MIL/uL — ABNORMAL LOW (ref 3.87–5.11)
RBC: 3.62 MIL/uL — ABNORMAL LOW (ref 3.87–5.11)
RDW: 14.8 % (ref 11.5–15.5)
RDW: 14.8 % (ref 11.5–15.5)
WBC: 6.4 10*3/uL (ref 4.0–10.5)
WBC: 5.2 10*3/uL (ref 4.0–10.5)

## 2014-07-06 LAB — BASIC METABOLIC PANEL
Anion gap: 11 (ref 5–15)
BUN: 8 mg/dL (ref 6–23)
CALCIUM: 8.4 mg/dL (ref 8.4–10.5)
CO2: 26 mEq/L (ref 19–32)
Chloride: 100 mEq/L (ref 96–112)
Creatinine, Ser: 0.89 mg/dL (ref 0.50–1.10)
GFR calc Af Amer: >90 mL/min (ref >90)
GFR calc non Af Amer: >90 mL/min (ref >90)
GLUCOSE: 95 mg/dL (ref 70–99)
Potassium: 3.5 mEq/L — ABNORMAL LOW (ref 3.7–5.3)
SODIUM: 137 meq/L (ref 137–147)

## 2014-07-06 LAB — GLUCOSE, CAPILLARY: GLUCOSE-CAPILLARY: 76 mg/dL (ref 70–99)

## 2014-07-06 SURGERY — SIGMOIDOSCOPY, FLEXIBLE
Anesthesia: Moderate Sedation

## 2014-07-06 MED ORDER — DIPHENHYDRAMINE HCL 50 MG/ML IJ SOLN
25.0000 mg | Freq: Once | INTRAMUSCULAR | Status: AC
  Administered 2014-07-06: 25 mg via INTRAVENOUS
  Filled 2014-07-06: qty 1

## 2014-07-06 MED ORDER — POTASSIUM CHLORIDE 10 MEQ/100ML IV SOLN
10.0000 meq | INTRAVENOUS | Status: AC
  Administered 2014-07-06 (×3): 10 meq via INTRAVENOUS
  Filled 2014-07-06 (×3): qty 100

## 2014-07-06 MED ORDER — SODIUM CHLORIDE 0.9 % IV SOLN: INTRAVENOUS | Status: DC

## 2014-07-06 MED ORDER — INFLUENZA VAC SPLIT QUAD 0.5 ML IM SUSY
0.5000 mL | PREFILLED_SYRINGE | INTRAMUSCULAR | Status: AC
  Administered 2014-07-07: 0.5 mL via INTRAMUSCULAR
  Filled 2014-07-06: qty 0.5

## 2014-07-06 MED ORDER — PNEUMOCOCCAL VAC POLYVALENT 25 MCG/0.5ML IJ INJ
0.5000 mL | INJECTION | INTRAMUSCULAR | Status: AC
  Administered 2014-07-07: 0.5 mL via INTRAMUSCULAR
  Filled 2014-07-06: qty 0.5

## 2014-07-06 MED ORDER — HYDROCODONE-ACETAMINOPHEN 5-325 MG PO TABS
1.0000 | ORAL_TABLET | Freq: Four times a day (QID) | ORAL | Status: DC | PRN
  Administered 2014-07-06: 1 via ORAL
  Administered 2014-07-07 – 2014-07-09 (×9): 2 via ORAL
  Filled 2014-07-06 (×10): qty 2

## 2014-07-06 MED ORDER — DIPHENHYDRAMINE HCL 50 MG/ML IJ SOLN
INTRAMUSCULAR | Status: AC
  Filled 2014-07-06: qty 1

## 2014-07-06 MED ORDER — FENTANYL CITRATE 0.05 MG/ML IJ SOLN
INTRAMUSCULAR | Status: AC
  Filled 2014-07-06: qty 2

## 2014-07-06 MED ORDER — MIDAZOLAM HCL 10 MG/2ML IJ SOLN
INTRAMUSCULAR | Status: DC | PRN
  Administered 2014-07-06 (×2): 2 mg via INTRAVENOUS
  Administered 2014-07-06: 1 mg via INTRAVENOUS

## 2014-07-06 MED ORDER — FENTANYL CITRATE 0.05 MG/ML IJ SOLN
INTRAMUSCULAR | Status: DC | PRN
  Administered 2014-07-06 (×3): 25 ug via INTRAVENOUS

## 2014-07-06 MED ORDER — MIDAZOLAM HCL 5 MG/ML IJ SOLN
INTRAMUSCULAR | Status: AC
  Filled 2014-07-06: qty 2

## 2014-07-06 NOTE — Consult Note (Signed)
Referring Provider: Dr. Lyda PeroneJared Gardner ([facility]) Primary Care Physician:  No primary care provider on file. Primary Gastroenterologist: None (unassigned)  Reason for Consultation:  Hematochezia, abdominal pain, abnormal CT scan  HPI: [name] is a 23 y.o. female admitted through the emergency room last night. Unfortunately, she is a very poor historian, is rather withdrawn, and disengaged from the history taking process. Therefore, nuances and details of the history may be limited.   With that background, the patient indicates that for about the past 10 days, she has been having mid abdominal discomfort, also in the left lower quadrant, characterized by some baseline discomfort with intensification with cramps when she passes what sounds like blood and mucus several times a day. It does not sound as though she is having diarrhea as such, that is, she is not really passing stool or having frequent loose stools. It sounds as though she passes blood and mucus several times a day. According to the patient, the symptoms have not kept her from doing her normal activities.   There is no family history of intestinal disorders, and the patient herself has not had symptoms similar to this in the past. She feels that the symptoms are not improving and perhaps becoming somewhat worse. She was [facility] for [facility] room about 10 days ago without specific diagnosis, and presented again to the emergency room last night because of ongoing symptoms.   A CT scan in the emergency room raise the question of some rectal thickening, possible proctitis.  Her hemoglobin has dropped from 12.2 to 9.4 over the past 2 weeks. Her white count is normal.   Since coming into the hospital, she has been receiving Vicodin for pain, and seems to want more of that medication. She does feel hungry.   Past Medical History  Diagnosis Date  . Constipation   . Anxiety   . Mental disorder    . ADHD (attention deficit hyperactivity disorder)   . Personality disorder   . Depression   . Anxiety disorder   . Substance abuse     Past Surgical History  Procedure Laterality Date  . Cesarean section    . Hernia repair      Prior to Admission medications   Medication Sig Start Date End Date Taking? Authorizing Provider  traZODone (DESYREL) 100 MG tablet Take 100 mg by mouth at bedtime.   Yes Historical Provider, MD  etonogestrel (NEXPLANON) 68 MG IMPL implant 1 each by Subdermal route once. 02/2013    Historical Provider, MD    Current Facility-Administered Medications  Medication Dose Route Frequency Provider Last Rate Last Dose  . 0.9 %  sodium chloride infusion   Intravenous STAT Suzi RootsKevin E Steinl, MD      . 0.9 %  sodium chloride infusion   Intravenous Continuous Hillary BowJared M Gardner, DO 75 mL/hr at 07/06/14 0228    . HYDROcodone-acetaminophen (NORCO/VICODIN) 5-325 MG per tablet 1 tablet  1 tablet Oral Q6H PRN Hillary BowJared M Gardner, DO   1 tablet at 07/05/14 2327  . [START ON 07/07/2014] Influenza vac split quadrivalent PF (FLUARIX) injection 0.5 mL  0.5 mL Intramuscular Tomorrow-1000 Hillary BowJared M Gardner, DO      . [START ON 07/07/2014] pneumococcal 23 valent vaccine (PNU-IMMUNE) injection 0.5 mL  0.5 mL Intramuscular Tomorrow-1000 Hillary BowJared M Gardner, DO      . sodium chloride 0.9 % injection 3 mL  3 mL Intravenous Q12H Hillary BowJared M Gardner, DO      .  traZODone (DESYREL) tablet 100 mg  100 mg Oral QHS Hillary BowJared M Gardner, DO   100 mg at 07/06/14 0231    Allergies as of 07/05/2014 - Review Complete 07/05/2014  Allergen Reaction Noted  . Latex Hives 05/26/2014    Family History  Problem Relation Age of Onset  . Alcohol abuse Neg Hx   . Arthritis Neg Hx   . Asthma Neg Hx   . Birth defects Neg Hx   . Cancer Neg Hx   . COPD Neg Hx   . Depression Neg Hx   . Diabetes Neg Hx   . Drug abuse Neg Hx   . Early death Neg Hx   . Hearing loss Neg Hx   . Heart disease Neg Hx   . Hyperlipidemia Neg Hx    . Hypertension Neg Hx   . Kidney disease Neg Hx   . Learning disabilities Neg Hx   . Mental illness Neg Hx   . Mental retardation Neg Hx   . Miscarriages / Stillbirths Neg Hx   . Stroke Neg Hx   . Vision loss Neg Hx   . Varicose Veins Neg Hx     History   Social History  . Marital Status: Single    Spouse Name: N/A    Number of Children: N/A  . Years of Education: N/A   Occupational History  . Not on file.   Social History Main Topics  . Smoking status: Current Every Day Smoker  . Smokeless tobacco: Not on file  . Alcohol Use: Yes  . Drug Use: Yes    Special: Cocaine, Marijuana  . Sexual Activity: Not on file   Other Topics Concern  . Not on file   Social History Narrative    Review of Systems: Please see history of present illness  Physical Exam: This is a well-nourished and generally healthy-appearing African-American female who is in absolutely no distress and in fact, was quite somnolent when I came in the room and it took some prompting to keep her awake during the course of the interview. Her boyfriend is at the bedside. All the shades are drawn and the lights are off in the room.  The conjunctivae are severely pale. She has a tongue piercing but the oropharynx is otherwise benign, specifically without thrush or oral ulcerations evident. There is no cervical or supraclavicular adenopathy. There is no evident thyromegaly. The chest is clear, without rales or wheezes anteriorly. The heart is without murmur or arrhythmia. The abdomen has active bowel sounds and is without organomegaly, guarding, mass effect, or tenderness.   Vital signs in last 24 hours: Temp:  [97.8 F (36.6 C)-99.1 F (37.3 C)] 98.7 F (37.1 C) (12/13 0705) Pulse Rate:  [52-110] 52 (12/13 0705) Resp:  [11-21] 20 (12/13 0705) BP: (106-137)/(54-97) 106/54 mmHg (12/13 0705) SpO2:  [98 %-100 %] 100 % (12/13 0705) Weight:  [53.071 kg (117 lb)] 53.071 kg (117 lb) (12/13 0600) Last BM Date:  (pt  unsure of last BM, says only passed clots/blood recently)   Intake/Output from previous day: 12/12 0701 - 12/13 0700 In: 338.8 [I.V.:338.8] Out: -  Intake/Output this shift:    Lab Results:  Recent Labs  07/05/14 1519 07/06/14 0526  WBC 8.3 6.4  HGB 10.8* 9.4*  HCT 33.5* 28.6*  PLT 362 288   BMET  Recent Labs  07/05/14 1519 07/06/14 0526  NA 139 137  K 4.0 3.5*  CL 101 100  CO2 24 26  GLUCOSE 90 95  BUN 7 8  CREATININE 0.78 0.89  CALCIUM 9.0 8.4   LFT  Recent Labs  07/05/14 1519  PROT 7.3  ALBUMIN 3.7  AST 19  ALT 12  ALKPHOS 109  BILITOT 0.5   PT/INR No results for input(s): LABPROT, INR in the last 72 hours.  Studies/Results: Ct Abdomen Pelvis W Contrast  07/05/2014   CLINICAL DATA:  Left lower quadrant pain radiating to right lower quadrant 5 weeks. Vomiting yesterday with bloody stool. Previous hernia repair.  EXAM: CT ABDOMEN AND PELVIS WITH CONTRAST  TECHNIQUE: Multidetector CT imaging of the abdomen and pelvis was performed using the standard protocol following bolus administration of intravenous contrast.  CONTRAST:  OMNIPAQUE IOHEXOL 300 MG/ML  SOLN  COMPARISON:  None.  FINDINGS: Lung bases are within normal.  Abdominal images demonstrate a normal liver, spleen, pancreas, gallbladder and adrenal glands. Kidneys are normal in size without hydronephrosis or nephrolithiasis. Appendix is normal. There is no free fluid or focal inflammatory change. Small bowel is unremarkable.  Pelvic images demonstrate the uterus, ovaries and bladder to be within normal. Minimal rectal wall thickening as cannot exclude mild proctitis. No adjacent inflammatory change or free fluid. 1.6 cm cyst along the posterior lateral left vaginal wall likely a Bartholin's gland cyst.  IMPRESSION: Mild rectal wall thickening which may be due to mild proctitis. No adjacent inflammatory change or free fluid.   Electronically Signed   By: Elberta Fortis M.D.   On: 07/05/2014 21:47     Impression: 1. Abdominal pain and bloody discharge per rectum, suggesting possible proctosigmoiditis, especially in view of CT abnormalities 2. Anemia, moderate, acute posthemorrhagic. 3. History of substance abuse  Plan: Sigmoidoscopic evaluation later today. Ashby Dawes, purpose, risks reviewed with the patient and her boyfriend and she is agreeable to proceed. Further management to depend on those findings.   LOS: 1 day   Azana Kiesler V  07/06/2014, 11:19 AM

## 2014-07-06 NOTE — Progress Notes (Signed)
[facility] PROGRESS NOTE  [name] ZOX:[id number] DOB: 03/21/1991 DOA: 07/05/2014 PCP: No primary care provider on file.  Assessment/Plan: 1-Acute GI bleed; hematochezia, proctitis on CT; Hb 10--9.4.  Repeat hb this morning, if continue to drop might need blood transfusion.  Colonoscopy today.   2-Pyuria; check urine culture.   3-Hypokalemia: replete IV   Code Status: full code.  Family Communication: care discussed with boyfriend who was at bedside.  Disposition Plan: Remain inpatient   Consultants:  Dr Matthias HughsBuccini.   Procedures:  Colonoscopy: 12-13  Antibiotics:  none  HPI/Subjective: Patient sleepy, wake up to answer questions. Relates she was not able to sleep last night. People always waking her up.  Still with small amount of blood per rectum. Complaining of abdominal pain.   Objective: Filed Vitals:   07/06/14 1146  BP: 123/69  Pulse: 65  Temp:   Resp: 16    Intake/Output Summary (Last 24 hours) at 07/06/14 1226 Last data filed at 07/06/14 0659  Gross per 24 hour  Intake 338.75 ml  Output      0 ml  Net 338.75 ml   Filed Weights   07/06/14 0600  Weight: 53.071 kg (117 lb)    Exam:   General:  NAD  Cardiovascular: S 1, S 2 RRR  Respiratory: CTA  Abdomen: BS present, soft, Mild lower quadrant tenderness.   Musculoskeletal: no edema.   Data Reviewed: Basic Metabolic Panel:  Recent Labs Lab 07/05/14 1519 07/06/14 0526  NA 139 137  K 4.0 3.5*  CL 101 100  CO2 24 26  GLUCOSE 90 95  BUN 7 8  CREATININE 0.78 0.89  CALCIUM 9.0 8.4   Liver Function Tests:  Recent Labs Lab 07/05/14 1519  AST 19  ALT 12  ALKPHOS 109  BILITOT 0.5  PROT 7.3  ALBUMIN 3.7   No results for input(s): LIPASE, AMYLASE in the last 168 hours. No results for input(s): AMMONIA in the last 168 hours. CBC:  Recent Labs Lab 07/05/14 1519 07/06/14 0526  WBC 8.3 6.4  NEUTROABS 4.7  --   HGB 10.8* 9.4*  HCT 33.5* 28.6*  MCV 85.7 86.1   PLT 362 288   Cardiac Enzymes: No results for input(s): CKTOTAL, CKMB, CKMBINDEX, TROPONINI in the last 168 hours. BNP (last 3 results) No results for input(s): PROBNP in the last 8760 hours. CBG: No results for input(s): GLUCAP in the last 168 hours.  No results found for this or any previous visit (from the past 240 hour(s)).   Studies: Ct Abdomen Pelvis W Contrast  07/05/2014   CLINICAL DATA:  Left lower quadrant pain radiating to right lower quadrant 5 weeks. Vomiting yesterday with bloody stool. Previous hernia repair.  EXAM: CT ABDOMEN AND PELVIS WITH CONTRAST  TECHNIQUE: Multidetector CT imaging of the abdomen and pelvis was performed using the standard protocol following bolus administration of intravenous contrast.  CONTRAST:  100mL OMNIPAQUE IOHEXOL 300 MG/ML  SOLN  COMPARISON:  None.  FINDINGS: Lung bases are within normal.  Abdominal images demonstrate a normal liver, spleen, pancreas, gallbladder and adrenal glands. Kidneys are normal in size without hydronephrosis or nephrolithiasis. Appendix is normal. There is no free fluid or focal inflammatory change. Small bowel is unremarkable.  Pelvic images demonstrate the uterus, ovaries and bladder to be within normal. Minimal rectal wall thickening as cannot exclude mild proctitis. No adjacent inflammatory change or free fluid. 1.6 cm cyst along the posterior lateral left vaginal wall likely a Bartholin's gland cyst.  IMPRESSION: Mild  rectal wall thickening which may be due to mild proctitis. No adjacent inflammatory change or free fluid.   Electronically Signed   By: Elberta Fortisaniel  Boyle M.D.   On: 07/05/2014 21:47    Scheduled Meds: . sodium chloride   Intravenous STAT  . [START ON 07/07/2014] Influenza vac split quadrivalent PF  0.5 mL Intramuscular Tomorrow-1000  . [START ON 07/07/2014] pneumococcal 23 valent vaccine  0.5 mL Intramuscular Tomorrow-1000  . sodium chloride  3 mL Intravenous Q12H  . traZODone  100 mg Oral QHS   Continuous  Infusions: . sodium chloride 75 mL/hr at 07/06/14 0228    Principal Problem:   GI bleed Active Problems:   Acute blood loss anemia   Rectal bleeding   Rectal bleed    Time spent: 35 minutes.     Hartley Barefootegalado, Najeh Credit A  [facility] Pager [phone]. If 7PM-7AM, please contact night-coverage at [email or web address], password [email or web address] 07/06/2014, 12:26 PM  LOS: 1 day

## 2014-07-06 NOTE — Progress Notes (Signed)
Patient's sigmoidoscopy shows significant rectal ulcerations, which are nonspecific in etiology. Please see dictated procedure report for details.  Florencia Reasonsobert V. Ernisha Sorn, M.D. [phone]

## 2014-07-06 NOTE — Op Note (Signed)
[facility] [address] [place], [id number]   FLEXIBLE SIGMOIDOSCOPY PROCEDURE REPORT  PATIENT: [name], [name]  MR#: [id number] BIRTHDATE: 01-Jun-1991 , 23  yrs. old GENDER: female ENDOSCOPIST: Bernette Redbirdobert Finesse Fielder, MD REFERRED BY:  unassigned PROCEDURE DATE:  07/06/2014 PROCEDURE:   flexible sigmoidoscopy with biopsy ASA CLASS:   II INDICATIONS:lower abdominal pain and hematochezia of 10 days' duration, with CT showing rectal thickening MEDICATIONS: fentanyl 75 g, Versed 5 mg IV  DESCRIPTION OF PROCEDURE:   After the risks benefits and alternatives of the procedure were thoroughly explained, informed consent was obtained.  digital exam is unremarkable.      The Pentax pediatric video colonoscope       endoscope was introduced through the anus  and advanced to approximately 35 cm. The exam was Without limitations.    The quality of the prep was very satisfactory, even though this procedure was done unprepped      . The instrument was then slowly withdrawn as the mucosa was fully examined.       the patient was [facility]. As noted above, the procedure was done unprepped. Time out was performed, and then the above sedation was administered prior to and during the course of the examination, without clinical instability.  Perianal exam was unremarkable. Digital exam showed somewhat increased anal sphincter tone. The colonoscope was advanced without significant difficulty to about 30 cm, whereupon I encountered a "logjam" of formed, nonbloody stool which prevented further passage of the scope. Pullback was therefore performed.  There was blood coating the rectosigmoid mucosa up to about 20 cm. Proximally, there was no blood.  In the rectum, there was a fair amount of mucosal edema with complete loss of vascularity, and some fairly deep, linear and serpiginous ulcerations. There was no diffuse  ulceration nor any granularity or diffuse exudate. No polyps or masses were seen. There was no diverticulosis or vascular ectasia. No discrete point source of bleeding was identified.         retroflexion was not performed.  Biopsies were obtained from the non-ulcerated sigmoid region, and from the ulcerated areas of the rectum, and placed in separate jars.         The scope was then withdrawn from the patient and the procedure terminated.  COMPLICATIONS: There were no immediate complications.  ENDOSCOPIC IMPRESSION: 1. No active bleeding at the time this exam, but a thin coating of blood was present on the distal rectosigmoid mucosa 2. Definite rectal ulcerations present, although their appearance is nonspecific. 3. No evidence of mass, polyp, or diverticular disease. No diffuse colitis present.  Discussion: The observed findings are not suggestive of ulcerative colitis based on the endoscopic appearance. Crohn's disease might look like this, but does not usually affect the distal rectum in an isolated fashion. There are not really changes of ischemia, nor is this the right location for that problem. Therefore, the observed findings are nonspecific in character, and do not indicate a specific etiology for her current condition.  RECOMMENDATIONS: 1. Await pathology results to help guide more specific therapy 2. While awaiting pathology results, I would not favor empiric treatment with either antibiotics or steroids. Rather, I would favor simply symptomatic management for now.  REPEAT EXAM: to be arranged, depending on histologic findings  eSigned:  Bernette Redbirdobert Oddie Bottger, MD 07/06/2014 1:01 PM   XB:JYNWCC:none  PATIENT NAME:  [name], [name] MR#: [id number]

## 2014-07-06 NOTE — Progress Notes (Signed)
UR completed 

## 2014-07-07 ENCOUNTER — Encounter (HOSPITAL_COMMUNITY): Payer: Self-pay | Admitting: Gastroenterology

## 2014-07-07 DIAGNOSIS — D62 Acute posthemorrhagic anemia: Secondary | ICD-10-CM | POA: Diagnosis not present

## 2014-07-07 DIAGNOSIS — K625 Hemorrhage of anus and rectum: Secondary | ICD-10-CM | POA: Diagnosis not present

## 2014-07-07 LAB — BASIC METABOLIC PANEL
ANION GAP: 10 (ref 5–15)
BUN: 6 mg/dL (ref 6–23)
CALCIUM: 8.4 mg/dL (ref 8.4–10.5)
CHLORIDE: 105 meq/L (ref 96–112)
CO2: 23 meq/L (ref 19–32)
Creatinine, Ser: 0.61 mg/dL (ref 0.50–1.10)
GFR calc Af Amer: >90 mL/min (ref >90)
GFR calc non Af Amer: >90 mL/min (ref >90)
Glucose, Bld: 88 mg/dL (ref 70–99)
POTASSIUM: 3.9 meq/L (ref 3.7–5.3)
SODIUM: 138 meq/L (ref 137–147)

## 2014-07-07 LAB — CBC
HCT: 27.2 % — ABNORMAL LOW (ref 36.0–46.0)
HCT: 27.4 % — ABNORMAL LOW (ref 36.0–46.0)
Hemoglobin: 8.9 g/dL — ABNORMAL LOW (ref 12.0–15.0)
Hemoglobin: 8.8 g/dL — ABNORMAL LOW (ref 12.0–15.0)
MCH: 29.3 pg (ref 26.0–34.0)
MCH: 27.9 pg (ref 26.0–34.0)
MCHC: 32.7 g/dL (ref 30.0–36.0)
MCHC: 32.1 g/dL (ref 30.0–36.0)
MCV: 89.5 fL (ref 78.0–100.0)
MCV: 87 fL (ref 78.0–100.0)
PLATELETS: 281 10*3/uL (ref 150–400)
Platelets: 294 10*3/uL (ref 150–400)
RBC: 3.04 MIL/uL — AB (ref 3.87–5.11)
RBC: 3.15 MIL/uL — ABNORMAL LOW (ref 3.87–5.11)
RDW: 15 % (ref 11.5–15.5)
RDW: 14.8 % (ref 11.5–15.5)
WBC: 5.9 10*3/uL (ref 4.0–10.5)
WBC: 5.6 10*3/uL (ref 4.0–10.5)

## 2014-07-07 LAB — HIV ANTIBODY (ROUTINE TESTING W REFLEX): HIV 1&2 Ab, 4th Generation: NONREACTIVE

## 2014-07-07 MED ORDER — PANTOPRAZOLE SODIUM 40 MG IV SOLR
40.0000 mg | INTRAVENOUS | Status: DC
  Administered 2014-07-07 – 2014-07-09 (×3): 40 mg via INTRAVENOUS
  Filled 2014-07-07 (×4): qty 40

## 2014-07-07 MED ORDER — DOCUSATE SODIUM 100 MG PO CAPS
100.0000 mg | ORAL_CAPSULE | Freq: Two times a day (BID) | ORAL | Status: DC
  Administered 2014-07-07 – 2014-07-09 (×5): 100 mg via ORAL
  Filled 2014-07-07 (×6): qty 1

## 2014-07-07 NOTE — Progress Notes (Signed)
[facility] PROGRESS NOTE  Olivia RoesStanlesha Jenne WUJ:[id number] DOB: 08/18/1990 DOA: 07/05/2014 PCP: No primary care provider on file.  Olivia RoesStanlesha Guilliams is a 23 y.o. female who presents to the ED with c/o dark blood per rectum for the past 10 days. 10 days ago she was seen in the ED for constipation.    Assessment/Plan: Acute GI bleed; hematochezia, proctitis on CT Hb 10.1 >> 8.9.  Patient reports continued bleeding without stool. Appreciate GI Consultation. Flex Sig 12/13 with biopsies obtained.   Path pending.  Constipation Stool softeners   Pyuria U/A appears to be contaminated - squamous cells follow urine culture.  Not currently on antibiotics.  Hypokalemia Repleted.  Burning reflux symptoms protonix started.  Hx of substance abuse.  Personality disorder. Patient states she has been off of psychotropic medications for 3 years due to pregnancies. She requested medications be restarted.  Recommend patient have outpatient psych eval to restart medications. Will continue trazodone.   Code Status: full code.  Family Communication: care discussed with boyfriend who was at bedside.  Disposition Plan: Remain inpatient   Consultants:  Dr Matthias HughsBuccini.   Procedures:  Flex sig: 12-13  Antibiotics:  none  HPI/Subjective: Patient reports abdominal pain that is only eased for a short period with oxy.  She reports continued bleeding and burning in her stomach.   Objective: Filed Vitals:   07/07/14 1337  BP: 127/76  Pulse: 65  Temp: 98.5 F (36.9 C)  Resp: 16    Intake/Output Summary (Last 24 hours) at 07/07/14 1540 Last data filed at 07/07/14 1300  Gross per 24 hour  Intake    240 ml  Output    200 ml  Net     40 ml   Filed Weights   07/06/14 0600  Weight: 53.071 kg (117 lb)    Exam:   General:  NAD  Cardiovascular: S 1, S 2 RRR  Respiratory: CTA  Abdomen: BS present, soft, Mild lower quadrant tenderness.   Musculoskeletal: no edema.    Data Reviewed: Basic Metabolic Panel:  Recent Labs Lab 07/05/14 1519 07/06/14 0526 07/07/14 0821  NA 139 137 138  K 4.0 3.5* 3.9  CL 101 100 105  CO2 24 26 23   GLUCOSE 90 95 88  BUN 7 8 6   CREATININE 0.78 0.89 0.61  CALCIUM 9.0 8.4 8.4   Liver Function Tests:  Recent Labs Lab 07/05/14 1519  AST 19  ALT 12  ALKPHOS 109  BILITOT 0.5  PROT 7.3  ALBUMIN 3.7   CBC:  Recent Labs Lab 07/05/14 1519 07/06/14 0526 07/06/14 1327 07/07/14 0821  WBC 8.3 6.4 5.2 5.9  NEUTROABS 4.7  --   --   --   HGB 10.8* 9.4* 10.1* 8.9*  HCT 33.5* 28.6* 31.4* 27.2*  MCV 85.7 86.1 86.7 89.5  PLT 362 288 297 281   CBG:  Recent Labs Lab 07/06/14 1322  GLUCAP 76      Studies: Ct Abdomen Pelvis W Contrast  07/05/2014   CLINICAL DATA:  Left lower quadrant pain radiating to right lower quadrant 5 weeks. Vomiting yesterday with bloody stool. Previous hernia repair.  EXAM: CT ABDOMEN AND PELVIS WITH CONTRAST  TECHNIQUE: Multidetector CT imaging of the abdomen and pelvis was performed using the standard protocol following bolus administration of intravenous contrast.  CONTRAST:  100mL OMNIPAQUE IOHEXOL 300 MG/ML  SOLN  COMPARISON:  None.  FINDINGS: Lung bases are within normal.  Abdominal images demonstrate a normal liver, spleen, pancreas, gallbladder and adrenal glands. Kidneys  are normal in size without hydronephrosis or nephrolithiasis. Appendix is normal. There is no free fluid or focal inflammatory change. Small bowel is unremarkable.  Pelvic images demonstrate the uterus, ovaries and bladder to be within normal. Minimal rectal wall thickening as cannot exclude mild proctitis. No adjacent inflammatory change or free fluid. 1.6 cm cyst along the posterior lateral left vaginal wall likely a Bartholin's gland cyst.  IMPRESSION: Mild rectal wall thickening which may be due to mild proctitis. No adjacent inflammatory change or free fluid.   Electronically Signed   By: Elberta Fortisaniel  Boyle M.D.   On:  07/05/2014 21:47    Scheduled Meds: . docusate sodium  100 mg Oral BID  . pantoprazole (PROTONIX) IV  40 mg Intravenous Q24H  . sodium chloride  3 mL Intravenous Q12H  . traZODone  100 mg Oral QHS   Continuous Infusions: . sodium chloride      Principal Problem:   GI bleed Active Problems:   Acute blood loss anemia   Rectal bleeding   Rectal bleed    Time spent: 35 minutes.     Conley CanalYork, Marianne L, PA-C  [facility] Pager [phone]. If 7PM-7AM, please contact night-coverage at [email or web address], password [email or web address] 07/07/2014, 3:40 PM  LOS: 2 days    Patient seen and examined. Still complaining of epigastric pain, burning in quality/ Still with small amount of blood per rectum.  Repeat CBC tonight, transfusion as needed.  Await pathology results.  Appreciate Dr Matthias HughsBuccini help.

## 2014-07-07 NOTE — Progress Notes (Signed)
UR completed 

## 2014-07-07 NOTE — Progress Notes (Signed)
Still has low abdominal pain, worst on the left side. Also, some rectal bleeding/discharge, diminished in amount.  The patient is standing up in her room at the sink, in no distress.  Labs show a slight decline in hemoglobin to 8.9.  Sigmoidoscopic findings from yesterday reviewed.  Is still pending.  Impression: Rectal ulcerations of unclear cause  Plan: Await biopsy results, with treatment to be guided by those results. Symptomatic medication (pain medication) in the meantime.  [name], M.D. [phone]

## 2014-07-08 DIAGNOSIS — K626 Ulcer of anus and rectum: Secondary | ICD-10-CM

## 2014-07-08 DIAGNOSIS — K5900 Constipation, unspecified: Secondary | ICD-10-CM | POA: Diagnosis not present

## 2014-07-08 DIAGNOSIS — D62 Acute posthemorrhagic anemia: Secondary | ICD-10-CM | POA: Diagnosis not present

## 2014-07-08 DIAGNOSIS — K625 Hemorrhage of anus and rectum: Secondary | ICD-10-CM | POA: Diagnosis not present

## 2014-07-08 LAB — CBC
HCT: 27.2 % — ABNORMAL LOW (ref 36.0–46.0)
HEMOGLOBIN: 8.8 g/dL — AB (ref 12.0–15.0)
MCH: 27.9 pg (ref 26.0–34.0)
MCHC: 32.4 g/dL (ref 30.0–36.0)
MCV: 86.3 fL (ref 78.0–100.0)
Platelets: 323 10*3/uL (ref 150–400)
RBC: 3.15 MIL/uL — ABNORMAL LOW (ref 3.87–5.11)
RDW: 14.8 % (ref 11.5–15.5)
WBC: 5.2 10*3/uL (ref 4.0–10.5)

## 2014-07-08 LAB — URINE CULTURE
Colony Count: NO GROWTH
Culture: NO GROWTH

## 2014-07-08 MED ORDER — POLYETHYLENE GLYCOL 3350 17 G PO PACK
17.0000 g | PACK | Freq: Every day | ORAL | Status: DC
  Administered 2014-07-08 – 2014-07-09 (×2): 17 g via ORAL
  Filled 2014-07-08 (×2): qty 1

## 2014-07-08 MED ORDER — FERROUS SULFATE 325 (65 FE) MG PO TABS
325.0000 mg | ORAL_TABLET | Freq: Two times a day (BID) | ORAL | Status: DC
  Administered 2014-07-08 – 2014-07-09 (×2): 325 mg via ORAL
  Filled 2014-07-08 (×4): qty 1

## 2014-07-08 MED ORDER — SUCRALFATE 1 GM/10ML PO SUSP
1.0000 g | Freq: Three times a day (TID) | ORAL | Status: DC
  Administered 2014-07-08 – 2014-07-09 (×4): 1 g via ORAL
  Filled 2014-07-08 (×8): qty 10

## 2014-07-08 MED ORDER — ONDANSETRON HCL 4 MG/2ML IJ SOLN
4.0000 mg | Freq: Four times a day (QID) | INTRAMUSCULAR | Status: DC | PRN
  Administered 2014-07-08: 4 mg via INTRAVENOUS
  Filled 2014-07-08: qty 2

## 2014-07-08 MED ORDER — HYDROCORTISONE 100 MG/60ML RE ENEM
100.0000 mg | ENEMA | Freq: Every day | RECTAL | Status: DC
  Administered 2014-07-08: 100 mg via RECTAL
  Filled 2014-07-08 (×2): qty 1

## 2014-07-08 MED ORDER — LACTULOSE 10 GM/15ML PO SOLN
30.0000 g | Freq: Once | ORAL | Status: AC
  Administered 2014-07-08: 30 g via ORAL
  Filled 2014-07-08: qty 45

## 2014-07-08 MED ORDER — FERROUS SULFATE 325 (65 FE) MG PO TABS
325.0000 mg | ORAL_TABLET | Freq: Three times a day (TID) | ORAL | Status: DC
  Administered 2014-07-08 (×2): 325 mg via ORAL
  Filled 2014-07-08 (×4): qty 1

## 2014-07-08 MED ORDER — FAMOTIDINE 20 MG PO TABS
20.0000 mg | ORAL_TABLET | Freq: Two times a day (BID) | ORAL | Status: DC | PRN
  Administered 2014-07-08 (×2): 20 mg via ORAL
  Filled 2014-07-08 (×4): qty 1

## 2014-07-08 MED ORDER — HYDROCORTISONE 100 MG/60ML RE ENEM
100.0000 mg | ENEMA | Freq: Every day | RECTAL | Status: DC
  Filled 2014-07-08: qty 1

## 2014-07-08 NOTE — Progress Notes (Signed)
Please see my notes from earlier today.  I have informed the patient of her biopsy results by telephone. I will try to come by her room later today to give her my card and directions to my office.  In the meantime, the patient notes that she does have a tendency toward constipation, all her life. She was constipated prior to developing the pain and bleeding that prompted the current admission, thereby making stercoral ulceration or rectal mucosal prolapse very possible (as opposed to rectal ischemia, which would be very unusual). The patient indicates that she was on Mira lax when [place] was in [place] group home [place].  Accordingly, I will start the patient on Mira lax and would send her home with a prescription for that as well.  In addition, knowing that it typically take several days for Mira lax to kick in, I will give the patient some lactulose today to get things started.  Please call me to discuss her case if you have any questions.  Florencia Reasonsobert V. Kreed Kauffman, M.D. [phone]

## 2014-07-08 NOTE — Progress Notes (Signed)
[facility] PROGRESS NOTE  [name] ZOX:[id number] DOB: 03/07/1991 DOA: 07/05/2014 PCP: No primary care provider on file.  [name] is a 23 y.o. female who presents to the ED with c/o dark blood per rectum for the past 10 days. 10 days ago she was seen in the ED for constipation.    Assessment/Plan: Acute GI bleed; hematochezia, proctitis on CT Hb 10.1 >> 8.9.  Patient reports less continued bleeding without stool.  Hgb is stable. Appreciate GI Consultation and recommendations. Flex Sig 12/13 with biopsies obtained.   Path indicates benign tissue with ischemic type injury.  Patient has chronic constipation which likely lead to her ulcerations.  Per GI the patient has been started on steroid enemas and stool softeners.  Constipation Stool softeners started 12/14.   Pyuria U/A appears to be contaminated - squamous cells Culture shows no growth.  Hypokalemia Repleted.  Burning reflux symptoms protonix started 12/14.  Carafate added 12/15.  Hx of substance abuse.  Personality disorder. Patient states she has been off of psychotropic medications for 3 years due to pregnancies. She requested medications be restarted.  Recommend patient have outpatient psych eval to restart medications.  Will continue trazodone.   Code Status: full code.  Family Communication: care discussed with patient and boyfriend who was at bedside.  Disposition Plan: D/C on 12/16 if bleeding has significantly decreased.   Consultants:  Dr Matthias HughsBuccini.   Procedures:  Flex sig: 12-13  Antibiotics:  none  HPI/Subjective: Patient reports continued bleeding, but less.  Also reports continued burning pain in her stomach.  Objective: Filed Vitals:   07/08/14 1449  BP: 117/59  Pulse: 68  Temp: 98.9 F (37.2 C)  Resp: 20    Intake/Output Summary (Last 24 hours) at 07/08/14 1518 Last data filed at 07/08/14 1009  Gross per 24 hour  Intake    360 ml  Output    120 ml  Net     240 ml   Filed Weights   07/06/14 0600  Weight: 53.071 kg (117 lb)    Exam:   General:  NAD, A&O, young female with boyfriend at bedside.  Cardiovascular: S1, S 2 RRR, no m/r/g  Respiratory: CTA, no w/c/r  Abdomen: BS present, soft, Mild lower quadrant tenderness. No masses  Musculoskeletal: no edema. 5/5 strength in each.  Data Reviewed: Basic Metabolic Panel:  Recent Labs Lab 07/05/14 1519 07/06/14 0526 07/07/14 0821  NA 139 137 138  K 4.0 3.5* 3.9  CL 101 100 105  CO2 24 26 23   GLUCOSE 90 95 88  BUN 7 8 6   CREATININE 0.78 0.89 0.61  CALCIUM 9.0 8.4 8.4   Liver Function Tests:  Recent Labs Lab 07/05/14 1519  AST 19  ALT 12  ALKPHOS 109  BILITOT 0.5  PROT 7.3  ALBUMIN 3.7   CBC:  Recent Labs Lab 07/05/14 1519 07/06/14 0526 07/06/14 1327 07/07/14 0821 07/07/14 1940 07/08/14 0617  WBC 8.3 6.4 5.2 5.9 5.6 5.2  NEUTROABS 4.7  --   --   --   --   --   HGB 10.8* 9.4* 10.1* 8.9* 8.8* 8.8*  HCT 33.5* 28.6* 31.4* 27.2* 27.4* 27.2*  MCV 85.7 86.1 86.7 89.5 87.0 86.3  PLT 362 288 297 281 294 323   CBG:  Recent Labs Lab 07/06/14 1322  GLUCAP 76      Studies: No results found.  Scheduled Meds: . docusate sodium  100 mg Oral BID  . ferrous sulfate  325 mg Oral BID  WC  . hydrocortisone  100 mg Rectal QHS  . pantoprazole (PROTONIX) IV  40 mg Intravenous Q24H  . polyethylene glycol  17 g Oral Daily  . sodium chloride  3 mL Intravenous Q12H  . sucralfate  1 g Oral TID WC & HS  . traZODone  100 mg Oral QHS   Continuous Infusions: . sodium chloride      Principal Problem:   GI bleed Active Problems:   Acute blood loss anemia   Rectal bleeding   Rectal ulceration   Constipation    Time spent: 35 minutes.     Conley CanalYork, Marianne L, PA-C  [facility] Pager [phone]. If 7PM-7AM, please contact night-coverage at [email or web address], password [email or web address] 07/08/2014, 3:18 PM  LOS: 3 days    Patient seen and examined.  Agree with assessment  and plan as above.  Still with rectal pain, and epigastric pain.  Started on steroids suppository, stool softener, also oral iron.   [name], MD.

## 2014-07-08 NOTE — Progress Notes (Signed)
GASTROENTEROLOGY PROGRESS NOTE  Problem:   Rectal bleeding, abdominal pain, and nonspecific rectal ulcerations  Subjective: Still having abdominal pain, but it's less. Wants more pain medication, the medication she is getting does not last long enough, even in the higher dose of to Vicodin. Bleeding has not totally resolved, but has diminished, by her report. She is starting to wonder if she could go home.  Objective: The patient looks well in the abdomen is soft and nontender. Hemoglobin has leveled off at 8.8.  Biopsies have finally come back, and show nonspecific ulceration, possibly ischemic (that would be unusual in the rectum), possibly due to prolapse of rectal mucosa. Specifically, the biopsies did not have the appearance of inflammatory bowel disease.   Assessment: Nonspecific rectal ulceration with bleeding, improving  Plan: 1. Initiate steroid enemas, although it is not clear how much they will really accelerate the healing process 2. If the patient wants to be discharged later today, I think that is reasonable. Alternatively, it might be nice for her to get accustomed to doing steroid enemas before discharge. I have arranged follow-up with me as an outpatient on Jan. 4, 2016 at 4:30 pm.  Florencia Reasonsobert V. Aldeen Riga, M.D. 07/08/2014 10:48 AM

## 2014-07-08 NOTE — Progress Notes (Signed)
Please see my previous several notes from today.  I see where the patient is already on Mira lax.  I will go ahead with lactulose as a one-time order.  Florencia Reasonsobert V. Fumi Guadron, M.D. [phone]

## 2014-07-08 NOTE — Progress Notes (Signed)
Chaplain responded to consult that pt requested prayer.  Pt shared some medical concerns about health.  Pt requested a Bible and that chaplain return once significant other arrives to visit.  Chaplain provided Bible, emotional and spiritual support as well as the ministry of presence.  Chaplain will return when paged to pray with pt and significant other.    07/08/14 1600  Clinical Encounter Type  Visited With Patient  Visit Type Initial;Spiritual support;Social support  Referral From Physician  Spiritual Encounters  Spiritual Needs [facility];Emotional  Stress Factors  Patient Stress Factors Exhausted;Health changes   Erroll LunaOvercash, Draden Cottingham A, Chaplain

## 2014-07-08 NOTE — Progress Notes (Signed)
UR completed 

## 2014-07-08 NOTE — Progress Notes (Signed)
Please see my earlier note from today.   Whenever the patient does go home, I would recommend hydrocortisone enema 100 mg rectally once a day at bedtime for 7 days.  Florencia Reasonsobert V. Kamber Vignola, M.D. [phone]

## 2014-07-09 DIAGNOSIS — K5909 Other constipation: Secondary | ICD-10-CM | POA: Diagnosis not present

## 2014-07-09 DIAGNOSIS — D62 Acute posthemorrhagic anemia: Secondary | ICD-10-CM | POA: Diagnosis not present

## 2014-07-09 LAB — HEMOGLOBIN AND HEMATOCRIT, BLOOD
HCT: 28.4 % — ABNORMAL LOW (ref 36.0–46.0)
HEMOGLOBIN: 8.9 g/dL — AB (ref 12.0–15.0)

## 2014-07-09 MED ORDER — PANTOPRAZOLE SODIUM 40 MG PO TBEC: 40.0000 mg | DELAYED_RELEASE_TABLET | Freq: Every day | ORAL | Status: DC

## 2014-07-09 MED ORDER — FERROUS SULFATE 325 (65 FE) MG PO TABS: 325.0000 mg | ORAL_TABLET | Freq: Two times a day (BID) | ORAL | Status: DC

## 2014-07-09 MED ORDER — DSS 100 MG PO CAPS: 100.0000 mg | ORAL_CAPSULE | Freq: Two times a day (BID) | ORAL | Status: DC

## 2014-07-09 MED ORDER — HYDROCORTISONE 100 MG/60ML RE ENEM
100.0000 mg | ENEMA | Freq: Every day | RECTAL | Status: DC
Start: 2014-07-09 — End: 2014-08-07

## 2014-07-09 MED ORDER — HYDROCODONE-ACETAMINOPHEN 5-325 MG PO TABS: 1.0000 | ORAL_TABLET | Freq: Four times a day (QID) | ORAL | Status: DC | PRN

## 2014-07-09 MED ORDER — POLYETHYLENE GLYCOL 3350 17 GM/SCOOP PO POWD: ORAL | Status: DC

## 2014-07-09 NOTE — Care Management Note (Signed)
    Page 1 of 1   07/09/2014     11:41:27 AM CARE MANAGEMENT NOTE 07/09/2014  Patient:  [name],[name]   Account Number:  [id number]  Date Initiated:  07/09/2014  Documentation initiated by:  Letha CapeAYLOR,Fidel Caggiano  Subjective/Objective Assessment:   dx abd pain, hematochezia  admit as observation.  pta indep.     Action/Plan:   Anticipated DC Date:  07/09/2014   Anticipated DC Plan:  HOME/SELF CARE      DC Planning Services  CM consult  Follow-up appt scheduled      Choice offered to / List presented to:             Status of service:  Completed, signed off Medicare Important Message given?  NO (If response is "NO", the following Medicare IM given date fields will be blank) Date Medicare IM given:   Medicare IM given by:   Date Additional Medicare IM given:   Additional Medicare IM given by:    Discharge Disposition:  HOME/SELF CARE  Per UR Regulation:  Reviewed for med. necessity/level of care/duration of stay  If discussed at Long Length of Stay Meetings, dates discussed:    Comments:  07/09/14 1137 Letha Capeeborah Joshua Zeringue RN, BSN [phone] patient is for  dc today, NCM gave patient [facility], and she will need to go there today to pick up meds protonix and glycomax powder and she will go to outpt pharmacy to pick up hydrocodone.  Patient does not have her id so NCM called over to outpt pharmacy and they stated as long as her friend has an id they can pick up hydrocodone.  Patient also states she will have her father pay for the hydrocortisone enemas that she need.

## 2014-07-09 NOTE — Progress Notes (Signed)
Patient discharge teaching given, including activity, diet, follow-up appoints, and medications. Patient verbalized understanding of all discharge instructions. IV access was d/c'd. Vitals are stable. Skin is intact except as charted in most recent assessments. Pt to be escorted out by RN, to be driven home by friend.  

## 2014-07-09 NOTE — Discharge Summary (Signed)
Physician Discharge Summary  [name] ZOX:[id number] DOB: [birth year] DOA: 07/05/2014  PCP: No primary care provider on file.  Admit date: 07/05/2014 Discharge date: 07/09/2014  Time spent: 45 minutes  Recommendations for Outpatient Follow-up:  1. [facility]  Check cbc, bmet.   2. Patient with severe constipation causing rectal ulcerations.  Emphasize bowel regimen. 3. Anemic.  Started on iron this admission.  Please evaluated further.  Discharge Diagnoses:    GI bleed   Rectal ulceration   Acute blood loss anemia   Rectal bleeding   Constipation   Discharge Condition: stable.  Diet recommendation: regular  Filed Weights   07/06/14 0600  Weight: 53.071 kg (117 lb)    History of present illness:  [name] is a 23 y.o. female who presents to the ED with c/o dark blood per rectum for the past 10 days. 10 days ago she was seen in the ED for constipation.   Hospital Course:   Acute GI bleed; hematochezia, proctitis on CT Patient was admitted with bright red blood per rectum, pain and constipation.  GI was consulted for continued bleeding.  Hb 10.1 >> 8.9. GI performed Flex Sig 12/13 which demonstrated rectal ulcerations.  Biopsies were obtained. Path indicated benign tissue with ischemic type injury. Patient has chronic constipation which likely lead to her ulcerations. Per GI the patient has been discharged on steroid enemas and stool softeners.    Constipation Stool softeners started 12/14.  Patient discharged with recommendations for colace and miralax daily.  Pyuria U/A appears to be contaminated - squamous cells Culture shows no growth.  Hypokalemia Repleted.  Burning reflux symptoms Protonix started 12/14. Patient was discharged with prescription for daily protonix.  Hx of substance abuse.  Personality disorder. Patient states she has been off of psychotropic medications for 3 years due to  pregnancies. She requested medications be restarted. Recommend patient have outpatient psych eval to restart medications. Will continue trazodone.  Procedures:  Flex sig with biopsy.  Consultations:  Gastroenterology  Discharge Exam: Filed Vitals:   07/08/14 0457 07/08/14 1449 07/08/14 2202 07/09/14 0454  BP: 138/87 117/59 113/68 114/59  Pulse: 70 68 66 59  Temp: 98.4 F (36.9 C) 98.9 F (37.2 C) 99.3 F (37.4 C) 98.9 F (37.2 C)  TempSrc: Oral Oral Oral Oral  Resp: 18 20 14 20   Height:      Weight:      SpO2: 100% 100% 100% 100%    General: NAD, A&O, young female with boyfriend at bedside. Awakens easily from sleep.  Cardiovascular: S1, S 2 RRR, no m/r/g, LEE  Respiratory: CTA, no w/c/r  Abdomen: BS present, soft, non tender to palpation.. No masses  Musculoskeletal: no edema. 5/5 strength in each.  Psych:  Patient is appropriate and cooperative.  Discharge Instructions   Discharge Instructions    Diet - low sodium heart healthy    Complete by:  As directed      Increase activity slowly    Complete by:  As directed           Discharge Medication List as of 07/09/2014  9:42 AM    START taking these medications   Details  docusate sodium 100 MG CAPS Take 100 mg by mouth 2 (two) times daily., Starting 07/09/2014, Until Discontinued, Print    ferrous sulfate 325 (65 FE) MG tablet Take 1 tablet (325 mg total) by mouth 2 (two) times daily with a meal., Starting 07/09/2014, Until Discontinued, Print  HYDROcodone-acetaminophen (NORCO/VICODIN) 5-325 MG per tablet Take 1-2 tablets by mouth every 6 (six) hours as needed for moderate pain., Starting 07/09/2014, Until Discontinued, Print    hydrocortisone (CORTENEMA) 100 MG/60ML enema Place 1 enema (100 mg total) rectally at bedtime., Starting 07/09/2014, Until Discontinued, Print    pantoprazole (PROTONIX) 40 MG tablet Take 1 tablet (40 mg total) by mouth daily., Starting 07/09/2014, Until Discontinued, Print     polyethylene glycol powder (GLYCOLAX) powder Take one capful (17 g) in 4 ounces (1/2 cup) of liquid each morning., Print      CONTINUE these medications which have NOT CHANGED   Details  traZODone (DESYREL) 100 MG tablet Take 100 mg by mouth at bedtime., Until Discontinued, Historical Med    etonogestrel (NEXPLANON) 68 MG IMPL implant 1 each by Subdermal route once. 02/2013, Historical Med       Allergies  Allergen Reactions  . Latex Hives   Follow-up Information    Follow up with [facility]     On 07/10/2014.   Why:  9 am for hospital follow up,    Contact information:   [address] [facility] [place] [id number] [phone]       The results of significant diagnostics from this hospitalization (including imaging, microbiology, ancillary and laboratory) are listed below for reference.    Significant Diagnostic Studies: Ct Abdomen Pelvis W Contrast  07/05/2014   CLINICAL DATA:  Left lower quadrant pain radiating to right lower quadrant 5 weeks. Vomiting yesterday with bloody stool. Previous hernia repair.  EXAM: CT ABDOMEN AND PELVIS WITH CONTRAST  TECHNIQUE: Multidetector CT imaging of the abdomen and pelvis was performed using the standard protocol following bolus administration of intravenous contrast.  CONTRAST:  100mL OMNIPAQUE IOHEXOL 300 MG/ML  SOLN  COMPARISON:  None.  FINDINGS: Lung bases are within normal.  Abdominal images demonstrate a normal liver, spleen, pancreas, gallbladder and adrenal glands. Kidneys are normal in size without hydronephrosis or nephrolithiasis. Appendix is normal. There is no free fluid or focal inflammatory change. Small bowel is unremarkable.  Pelvic images demonstrate the uterus, ovaries and bladder to be within normal. Minimal rectal wall thickening as cannot exclude mild proctitis. No adjacent inflammatory change or free fluid. 1.6 cm cyst along the posterior lateral left vaginal wall likely a  Bartholin's gland cyst.  IMPRESSION: Mild rectal wall thickening which may be due to mild proctitis. No adjacent inflammatory change or free fluid.   Electronically Signed   By: Elberta Fortisaniel  Boyle M.D.   On: 07/05/2014 21:47    Microbiology: Recent Results (from the past 240 hour(s))  Urine culture     Status: None   Collection Time: 07/07/14 10:08 AM  Result Value Ref Range Status   Specimen Description URINE, CLEAN CATCH  Final   Special Requests NONE  Final   Culture  Setup Time   Final    07/07/2014 10:40 [facility]formed at [facility]    Colony Count NO GROWTH Performed at [facility]   Final   Culture NO GROWTH Performed at [facility]   Final   Report Status 07/08/2014 FINAL  Final     Labs: Basic Metabolic Panel:  Recent Labs Lab 07/05/14 1519 07/06/14 0526 07/07/14 0821  NA 139 137 138  K 4.0 3.5* 3.9  CL 101 100 105  CO2 24 26 23   GLUCOSE 90 95 88  BUN 7 8 6   CREATININE 0.78 0.89 0.61  CALCIUM 9.0 8.4 8.4  Liver Function Tests:  Recent Labs Lab 07/05/14 1519  AST 19  ALT 12  ALKPHOS 109  BILITOT 0.5  PROT 7.3  ALBUMIN 3.7   CBC:  Recent Labs Lab 07/05/14 1519 07/06/14 0526 07/06/14 1327 07/07/14 0821 07/07/14 1940 07/08/14 0617 07/09/14 0500  WBC 8.3 6.4 5.2 5.9 5.6 5.2  --   NEUTROABS 4.7  --   --   --   --   --   --   HGB 10.8* 9.4* 10.1* 8.9* 8.8* 8.8* 8.9*  HCT 33.5* 28.6* 31.4* 27.2* 27.4* 27.2* 28.4*  MCV 85.7 86.1 86.7 89.5 87.0 86.3  --   PLT 362 288 297 281 294 323  --    BNP: CBG:  Recent Labs Lab 07/06/14 1322  GLUCAP 76       Signed:  YorkAntonietta Jewel, Marianne L, PA-C  [facility] 07/09/2014, 3:13 PM  Patient seen and examined. Agree with Assessment and plan as above.  Pain is better, hb stable. Ok to discharge today, with close follow up.

## 2014-08-06 ENCOUNTER — Emergency Department (HOSPITAL_COMMUNITY)
Admission: EM | Admit: 2014-08-06 | Discharge: 2014-08-07 | Disposition: A | Discharge status: Other Acute Inpatient Hospital | Payer: Self-pay | Attending: Emergency Medicine | Admitting: Emergency Medicine

## 2014-08-06 ENCOUNTER — Encounter (HOSPITAL_COMMUNITY): Payer: Self-pay | Admitting: Emergency Medicine

## 2014-08-06 DIAGNOSIS — R45850 Homicidal ideations: Secondary | ICD-10-CM | POA: Insufficient documentation

## 2014-08-06 DIAGNOSIS — Z825 Family history of asthma and other chronic lower respiratory diseases: Secondary | ICD-10-CM | POA: Insufficient documentation

## 2014-08-06 DIAGNOSIS — R102 Pelvic and perineal pain: Secondary | ICD-10-CM

## 2014-08-06 DIAGNOSIS — F329 Major depressive disorder, single episode, unspecified: Secondary | ICD-10-CM | POA: Insufficient documentation

## 2014-08-06 DIAGNOSIS — F6089 Other specific personality disorders: Secondary | ICD-10-CM | POA: Insufficient documentation

## 2014-08-06 DIAGNOSIS — F29 Unspecified psychosis not due to a substance or known physiological condition: Secondary | ICD-10-CM | POA: Insufficient documentation

## 2014-08-06 DIAGNOSIS — F419 Anxiety disorder, unspecified: Secondary | ICD-10-CM | POA: Insufficient documentation

## 2014-08-06 DIAGNOSIS — F17210 Nicotine dependence, cigarettes, uncomplicated: Secondary | ICD-10-CM | POA: Insufficient documentation

## 2014-08-06 DIAGNOSIS — F909 Attention-deficit hyperactivity disorder, unspecified type: Secondary | ICD-10-CM | POA: Insufficient documentation

## 2014-08-06 DIAGNOSIS — F32A Depression, unspecified: Secondary | ICD-10-CM

## 2014-08-06 LAB — CBC
HEMATOCRIT: 29.4 % — AB (ref 36.0–46.0)
HEMOGLOBIN: 9.5 g/dL — AB (ref 12.0–15.0)
MCH: 28 pg (ref 26.0–34.0)
MCHC: 32.3 g/dL (ref 30.0–36.0)
MCV: 86.7 fL (ref 78.0–100.0)
Platelets: 448 10*3/uL — ABNORMAL HIGH (ref 150–400)
RBC: 3.39 MIL/uL — AB (ref 3.87–5.11)
RDW: 15.1 % (ref 11.5–15.5)
WBC: 6 10*3/uL (ref 4.0–10.5)

## 2014-08-06 LAB — RAPID URINE DRUG SCREEN, HOSP PERFORMED
AMPHETAMINES: NOT DETECTED
BENZODIAZEPINES: NOT DETECTED
Barbiturates: NOT DETECTED
Cocaine: POSITIVE — AB
Opiates: NOT DETECTED
Tetrahydrocannabinol: POSITIVE — AB

## 2014-08-06 LAB — URINE MICROSCOPIC-ADD ON

## 2014-08-06 LAB — URINALYSIS, ROUTINE W REFLEX MICROSCOPIC
BILIRUBIN URINE: NEGATIVE
Glucose, UA: NEGATIVE mg/dL
HGB URINE DIPSTICK: NEGATIVE
Ketones, ur: NEGATIVE mg/dL
Nitrite: NEGATIVE
PH: 7 (ref 5.0–8.0)
Protein, ur: NEGATIVE mg/dL
SPECIFIC GRAVITY, URINE: 1.013 (ref 1.005–1.030)
UROBILINOGEN UA: 1 mg/dL (ref 0.0–1.0)

## 2014-08-06 LAB — PREGNANCY, URINE: Preg Test, Ur: NEGATIVE

## 2014-08-06 LAB — COMPREHENSIVE METABOLIC PANEL
ALBUMIN: 3.4 g/dL — AB (ref 3.5–5.2)
ALT: 12 U/L (ref 0–35)
AST: 18 U/L (ref 0–37)
Alkaline Phosphatase: 103 U/L (ref 39–117)
Anion gap: 7 (ref 5–15)
BUN: 10 mg/dL (ref 6–23)
CO2: 28 mmol/L (ref 19–32)
CREATININE: 0.89 mg/dL (ref 0.50–1.10)
Calcium: 8.8 mg/dL (ref 8.4–10.5)
Chloride: 104 mEq/L (ref 96–112)
GFR calc Af Amer: >90 mL/min (ref >90)
GFR calc non Af Amer: >90 mL/min (ref >90)
Glucose, Bld: 93 mg/dL (ref 70–99)
Potassium: 3.7 mmol/L (ref 3.5–5.1)
Sodium: 139 mmol/L (ref 135–145)
Total Bilirubin: 0.2 mg/dL — ABNORMAL LOW (ref 0.3–1.2)
Total Protein: 6.3 g/dL (ref 6.0–8.3)

## 2014-08-06 LAB — ACETAMINOPHEN LEVEL

## 2014-08-06 LAB — LIPASE, BLOOD: Lipase: 29 U/L (ref 11–59)

## 2014-08-06 LAB — SALICYLATE LEVEL: Salicylate Lvl: <4 mg/dL (ref 2.8–20.0)

## 2014-08-06 LAB — I-STAT TROPONIN, ED: TROPONIN I, POC: 0 ng/mL (ref 0.00–0.08)

## 2014-08-06 LAB — POC OCCULT BLOOD, ED: FECAL OCCULT BLD: POSITIVE — AB

## 2014-08-06 LAB — ETHANOL

## 2014-08-06 MED ORDER — METRONIDAZOLE 500 MG PO TABS
500.0000 mg | ORAL_TABLET | Freq: Once | ORAL | Status: AC
  Administered 2014-08-06: 500 mg via ORAL
  Filled 2014-08-06: qty 1

## 2014-08-06 MED ORDER — CIPROFLOXACIN HCL 500 MG PO TABS
500.0000 mg | ORAL_TABLET | Freq: Once | ORAL | Status: AC
  Administered 2014-08-06: 500 mg via ORAL
  Filled 2014-08-06: qty 1

## 2014-08-06 NOTE — ED Provider Notes (Signed)
CSN: [id number]     Arrival date & time 08/06/14  1839 History   First MD Initiated Contact with Patient 08/06/14 1934     Chief Complaint  Patient presents with  . Medical Clearance  . Homicidal     (Consider location/radiation/quality/duration/timing/severity/associated sxs/prior Treatment) The history is provided by the patient. No language interpreter was used.  Mariha Sleeper is a 24 year old female with past medical history of constipation, anxiety, mental disorder, ADHD, personality disorder, substance abuse presenting to emergency department with increased depression and homicidal ideation towards her family. Patient reported that the homicidal ideation has been ongoing for approximately one month-reported that her family and school are stressing her out. Patient reported that she normally is on medications for her personality disorder-reported that she is supposed be taking Seroquel, Wellbutrin - but stated that she has not been on these medications for over a year secondary to her recent pregnancy. Stated that she has been hearing voices and the female voice-stated that she's been hearing the same voice since she was 24 years old. When asked what this voices telling her, patient does not reply and says "I don't know." Stated that she has been seeing shadows as well. Reported that she continues to have left abdominal pain with melenic stools with each bowel movement-reported that she does have chronic constipation and had a flexible sigmoidoscopy performed in December 2015 with unremarkable findings. Reported that she does use alcohol at least one 40 ounce every day. Reported that she does use marijuana daily. Reports she does smoke cigarettes daily. Stated that she used cocaine a couple of days ago, smoked, unable to recall how much. Reported that she smoked heroin approximately one month ago, cannot recall exactly how much. Denied visual changes, fever, chills, chest pain, shortness of  breath, difficulty breathing, nausea, vomiting, diarrhea, fainting, syncope, weakness, fatigue, neck pain, back pain, changes to eating habits, suicidal ideation. PCP none   Past Medical History  Diagnosis Date  . Constipation   . Anxiety   . Mental disorder   . ADHD (attention deficit hyperactivity disorder)   . Personality disorder   . Depression   . Anxiety disorder   . Substance abuse    Past Surgical History  Procedure Laterality Date  . Cesarean section    . Hernia repair    . Flexible sigmoidoscopy N/A 07/06/2014    Procedure: FLEXIBLE SIGMOIDOSCOPY;  Surgeon: Florencia Reasons, MD;  Location[facility] ENDOSCOPY;  Service: Endoscopy;  Laterality: N/A;   Family History  Problem Relation Age of Onset  . Alcohol abuse Neg Hx   . Arthritis Neg Hx   . Asthma Neg Hx   . Birth defects Neg Hx   . Cancer Neg Hx   . COPD Neg Hx   . Depression Neg Hx   . Diabetes Neg Hx   . Drug abuse Neg Hx   . Early death Neg Hx   . Hearing loss Neg Hx   . Heart disease Neg Hx   . Hyperlipidemia Neg Hx   . Hypertension Neg Hx   . Kidney disease Neg Hx   . Learning disabilities Neg Hx   . Mental illness Neg Hx   . Mental retardation Neg Hx   . Miscarriages / Stillbirths Neg Hx   . Stroke Neg Hx   . Vision loss Neg Hx   . Varicose Veins Neg Hx    History  Substance Use Topics  . Smoking status: Current Every Day Smoker  . Smokeless tobacco:  Not on file  . Alcohol Use: Yes   OB History    Gravida Para Term Preterm AB TAB SAB Ectopic Multiple Living   3 3  3      3      Review of Systems  HENT: Negative for trouble swallowing.   Eyes: Negative for visual disturbance.  Respiratory: Negative for chest tightness and shortness of breath.   Cardiovascular: Negative for chest pain.  Gastrointestinal: Positive for abdominal pain, constipation (chronic) and blood in stool. Negative for nausea, vomiting, diarrhea and anal bleeding.  Genitourinary: Negative for dysuria and hematuria.   Musculoskeletal: Negative for back pain, neck pain and neck stiffness.  Neurological: Negative for dizziness, weakness, numbness and headaches.  Psychiatric/Behavioral: Positive for hallucinations and dysphoric mood. Negative for suicidal ideas, behavioral problems, confusion and decreased concentration. The patient is nervous/anxious.       Allergies  Latex  Home Medications   Prior to Admission medications   Medication Sig Start Date End Date Taking? Authorizing Provider  docusate sodium 100 MG CAPS Take 100 mg by mouth 2 (two) times daily. 07/09/14  Yes Marianne L York, PA-C  etonogestrel (NEXPLANON) 68 MG IMPL implant 1 each by Subdermal route once. 02/2013   Yes Historical Provider, MD  ferrous sulfate 325 (65 FE) MG tablet Take 1 tablet (325 mg total) by mouth 2 (two) times daily with a meal. 07/09/14  Yes Tora Kindred York, PA-C  hydrocortisone (CORTENEMA) 100 MG/60ML enema Place 1 enema (100 mg total) rectally at bedtime. 07/09/14  Yes Marianne L York, PA-C  pantoprazole (PROTONIX) 40 MG tablet Take 1 tablet (40 mg total) by mouth daily. 07/09/14  Yes Tora Kindred York, PA-C  polyethylene glycol powder (GLYCOLAX) powder Take one capful (17 g) in 4 ounces (1/2 cup) of liquid each morning. 07/09/14  Yes Marianne L York, PA-C  traZODone (DESYREL) 100 MG tablet Take 100 mg by mouth at bedtime.   Yes Historical Provider, MD   BP 116/74 mmHg  Pulse 69  Temp(Src) 99 F (37.2 C) (Oral)  Resp 20  Ht 5\' 8"  (1.727 m)  Wt 110 lb (49.896 kg)  BMI 16.73 kg/m2  SpO2 100% Physical Exam  Constitutional: She is oriented to person, place, and time. She appears well-developed and well-nourished. No distress.  HENT:  Head: Normocephalic and atraumatic.  Mouth/Throat: Oropharynx is clear and moist. No oropharyngeal exudate.  Eyes: Conjunctivae and EOM are normal. Right eye exhibits no discharge. Left eye exhibits no discharge.  Neck: Normal range of motion. Neck supple. No tracheal deviation  present.  Cardiovascular: Normal rate, regular rhythm and normal heart sounds.  Exam reveals no friction rub.   No murmur heard. Pulses:      Radial pulses are 2+ on the right side, and 2+ on the left side.       Dorsalis pedis pulses are 2+ on the right side, and 2+ on the left side.  Cap refill < 3 seconds Negative swelling or pitting edema noted to the lower extremities bilaterally   Pulmonary/Chest: Effort normal and breath sounds normal. No respiratory distress. She has no wheezes. She has no rales.  Patient is able to speak in full sentences without difficulty  Negative use of accessory muscles Negative stridor  Abdominal: Soft. Bowel sounds are normal. She exhibits no distension. There is tenderness. There is no rebound and no guarding.  Negative abdominal distention Bowel sounds normoactive in all 4 quadrants Abdomen soft upon palpation Negative peritoneal signs Discomfort upon palpation to the  left upper and lower quadrants of the abdomen Negative rigidity or guarding noted  Genitourinary: Guaiac positive stool.  Rectal exam: Negative erythema, inflammation, lesions, sores, hemorrhoids identified to the anus. Negative palpation of internal hemorrhoids. Thin dark red blood noted on glove - scant amount. Sphincter tone strong. Negative pain. Negative bright red blood per rectum.  Exam chaperoned with nurse, Tobi BastosAnna   Pelvic Exam: Negative swelling, erythema, inflammation, lesions, sores, deformities noted. Negative bright red blood in vaginal vault. Mild scant white discharge noted on exam. Cervix identified with negative friability. Negative CMT. Adnexal tenderness bilaterally noted on examination.  Exam chaperoned with tech  Musculoskeletal: Normal range of motion. She exhibits no edema or tenderness.  Full ROM to upper and lower extremities without difficulty noted, negative ataxia noted.  Lymphadenopathy:    She has no cervical adenopathy.  Neurological: She is alert and  oriented to person, place, and time. No cranial nerve deficit. She exhibits normal muscle tone. Coordination normal.  Cranial nerves III-XII grossly intact Strength 5+/5+ to upper and lower extremities bilaterally with resistance applied, equal distribution noted Equal grip strength bilaterally Negative facial droop Negative slurred speech Negative aphasia Patient follows commands well Patient response to questions appropriately Negative arm drift Fine motor skills intact Gait proper, proper balance - negative sway, negative drift, negative step-offs  Skin: Skin is warm and dry. No rash noted. She is not diaphoretic. No erythema.  Psychiatric: She has a normal mood and affect.  Patient is pleasant and interactive Good eye contact  Nursing note and vitals reviewed.   ED Course  Procedures (including critical care time)  Results for orders placed or performed during the hospital encounter of 08/06/14  Acetaminophen level  Result Value Ref Range   Acetaminophen (Tylenol), Serum <10.0 (L) 10 - 30 ug/mL  CBC  Result Value Ref Range   WBC 6.0 4.0 - 10.5 K/uL   RBC 3.39 (L) 3.87 - 5.11 MIL/uL   Hemoglobin 9.5 (L) 12.0 - 15.0 g/dL   HCT 81.129.4 (L) 91.436.0 - 78.246.0 %   MCV 86.7 78.0 - 100.0 fL   MCH 28.0 26.0 - 34.0 pg   MCHC 32.3 30.0 - 36.0 g/dL   RDW 95.615.1 21.311.5 - 08.615.5 %   Platelets 448 (H) 150 - 400 K/uL  Comprehensive metabolic panel  Result Value Ref Range   Sodium 139 135 - 145 mmol/L   Potassium 3.7 3.5 - 5.1 mmol/L   Chloride 104 96 - 112 mEq/L   CO2 28 19 - 32 mmol/L   Glucose, Bld 93 70 - 99 mg/dL   BUN 10 6 - 23 mg/dL   Creatinine, Ser 5.780.89 0.50 - 1.10 mg/dL   Calcium 8.8 8.4 - 46.910.5 mg/dL   Total Protein 6.3 6.0 - 8.3 g/dL   Albumin 3.4 (L) 3.5 - 5.2 g/dL   AST 18 0 - 37 U/L   ALT 12 0 - 35 U/L   Alkaline Phosphatase 103 39 - 117 U/L   Total Bilirubin 0.2 (L) 0.3 - 1.2 mg/dL   GFR calc non Af Amer >90 >90 mL/min   GFR calc Af Amer >90 >90 mL/min   Anion gap 7 5 - 15   Ethanol (ETOH)  Result Value Ref Range   Alcohol, Ethyl (B) <5 0 - 9 mg/dL  Salicylate level  Result Value Ref Range   Salicylate Lvl <4.0 2.8 - 20.0 mg/dL  Urine Drug Screen  Result Value Ref Range   Opiates NONE DETECTED NONE DETECTED  Cocaine POSITIVE (A) NONE DETECTED   Benzodiazepines NONE DETECTED NONE DETECTED   Amphetamines NONE DETECTED NONE DETECTED   Tetrahydrocannabinol POSITIVE (A) NONE DETECTED   Barbiturates NONE DETECTED NONE DETECTED  Urinalysis, Routine w reflex microscopic  Result Value Ref Range   Color, Urine YELLOW YELLOW   APPearance CLEAR CLEAR   Specific Gravity, Urine 1.013 1.005 - 1.030   pH 7.0 5.0 - 8.0   Glucose, UA NEGATIVE NEGATIVE mg/dL   Hgb urine dipstick NEGATIVE NEGATIVE   Bilirubin Urine NEGATIVE NEGATIVE   Ketones, ur NEGATIVE NEGATIVE mg/dL   Protein, ur NEGATIVE NEGATIVE mg/dL   Urobilinogen, UA 1.0 0.0 - 1.0 mg/dL   Nitrite NEGATIVE NEGATIVE   Leukocytes, UA MODERATE (A) NEGATIVE  Lipase, blood  Result Value Ref Range   Lipase 29 11 - 59 U/L  Pregnancy, urine  Result Value Ref Range   Preg Test, Ur NEGATIVE NEGATIVE  Urine microscopic-add on  Result Value Ref Range   Squamous Epithelial / LPF RARE RARE   WBC, UA 7-10 <3 WBC/hpf   RBC / HPF 0-2 <3 RBC/hpf   Bacteria, UA RARE RARE   Urine-Other TRICHOMONAS PRESENT   POC occult blood, ED  Result Value Ref Range   Fecal Occult Bld POSITIVE (A) NEGATIVE  I-stat troponin, ED  Result Value Ref Range   Troponin i, poc 0.00 0.00 - 0.08 ng/mL   Comment 3          I-stat chem 8, ed  Result Value Ref Range   Sodium 139 135 - 145 mmol/L   Potassium 3.5 3.5 - 5.1 mmol/L   Chloride 101 96 - 112 mEq/L   BUN 13 6 - 23 mg/dL   Creatinine, Ser 2.13 0.50 - 1.10 mg/dL   Glucose, Bld 94 70 - 99 mg/dL   Calcium, Ion 0.86 5.78 - 1.23 mmol/L   TCO2 23 0 - 100 mmol/L   Hemoglobin 10.2 (L) 12.0 - 15.0 g/dL   HCT 46.9 (L) 62.9 - 52.8 %    Labs Review Labs Reviewed  ACETAMINOPHEN LEVEL -  Abnormal; Notable for the following:    Acetaminophen (Tylenol), Serum <10.0 (*)    All other components within normal limits  CBC - Abnormal; Notable for the following:    RBC 3.39 (*)    Hemoglobin 9.5 (*)    HCT 29.4 (*)    Platelets 448 (*)    All other components within normal limits  COMPREHENSIVE METABOLIC PANEL - Abnormal; Notable for the following:    Albumin 3.4 (*)    Total Bilirubin 0.2 (*)    All other components within normal limits  URINE RAPID DRUG SCREEN (HOSP PERFORMED) - Abnormal; Notable for the following:    Cocaine POSITIVE (*)    Tetrahydrocannabinol POSITIVE (*)    All other components within normal limits  URINALYSIS, ROUTINE W REFLEX MICROSCOPIC - Abnormal; Notable for the following:    Leukocytes, UA MODERATE (*)    All other components within normal limits  POC OCCULT BLOOD, ED - Abnormal; Notable for the following:    Fecal Occult Bld POSITIVE (*)    All other components within normal limits  I-STAT CHEM 8, ED - Abnormal; Notable for the following:    Hemoglobin 10.2 (*)    HCT 30.0 (*)    All other components within normal limits  URINE CULTURE  WET PREP, GENITAL  ETHANOL  SALICYLATE LEVEL  LIPASE, BLOOD  PREGNANCY, URINE  URINE MICROSCOPIC-ADD ON  HIV ANTIBODY (ROUTINE  TESTING)  I-STAT TROPOININ, ED  GC/CHLAMYDIA PROBE AMP ([place])    Imaging Review No results found.   EKG Interpretation None       Orthostatic VS for the past 24 hrs:  BP- Lying Pulse- Lying BP- Sitting Pulse- Sitting BP- Standing at 0 minutes Pulse- Standing at 0 minutes  08/06/14 2233 129/68 mmHg 77 120/65 mmHg 70 128/81 mmHg 85   11:04 PM this provider discussed case in great detail with attending physician, Dr. Charlann Boxer. As per physician recommended patient to be treated with Cipro and Flagyl and for CBC to be repeated.   1:54 AM spoke with Aurther Loft, [facility], reported that patient has been accepted to [facility] and that patient can be transferred over-bed  available. Patient has been accepted under the care of Dr. Jama Flavors.   2:05 AM This provider had a long discussion of case and history with Dr. Jenean Lindau. As per physician reported that patient can be transferred to behavorial health - reported that patient is stable for transfer.   MDM   Final diagnoses:  Depression  Psychosis, unspecified psychosis type  Pelvic pain in female    Medications  ondansetron (ZOFRAN) tablet 4 mg (not administered)  acetaminophen (TYLENOL) tablet 650 mg (650 mg Oral Given 08/07/14 0017)  pantoprazole (PROTONIX) EC tablet 40 mg (not administered)  ferrous sulfate tablet 325 mg (not administered)  ciprofloxacin (CIPRO) tablet 500 mg (500 mg Oral Given 08/06/14 2353)  metroNIDAZOLE (FLAGYL) tablet 500 mg (500 mg Oral Given 08/06/14 2353)    Filed Vitals:   08/06/14 1845 08/06/14 2232 08/06/14 2354  BP: 128/81 129/68 116/74  Pulse: 80 77 69  Temp: 98.1 F (36.7 C)  99 F (37.2 C)  TempSrc: Oral  Oral  Resp: 18  20  Height:  (1.727 m)    Weight: 110 lb (49.896 kg)    SpO2: 100% 100% 100%   This provider reviewed patient's chart. Patient was seen in the ED setting on 07/05/2014 regarding left lower quadrant and left upper quadrant abdominal pain with bloody stools. Patient appeared to have a low hemoglobin, orthostatic, with a near syncopal episode in the ED setting. Patient was admitted to the hospital. CT abdomen and pelvis with contrast was performed at this time with findings consistent for mild rectal wall thickening that may be due to proctitis. GI was consulted - flexible sigmoidoscopy was performed on 07/06/2014 that showed no active bleeding, thin coating of blood present on the distal rectosigmoid close with rectal ulcerations identified that are nonspecific - gastroenterology recommended symptomatically therapy rather than in her antibiotics. EKG noted sinus rhythm with short PR. Troponin negative elevation. CBC negative elevated leukocytosis.  Hemoglobin 9.5, hematocrit 29.4 - when compared to previous labs, patient's Hgb was as low as 8.8 in December 2015. CMP unremarkable. Ethanol negative elevation. Lipase negative elevation. Acetaminophen and salicylate level negative elevation. Urine drug screen positive for cocaine and cannabis. Urinalysis moderate leukocytes with negative nitrites or hemoglobin. White blood cell count 7-10. UA noted Trichomonas. Urine culture pending. Urine pregnancy negative. Fecal occult positive. Repeat Hgb has improved with re-check of chem-8 - Hgb 10.2.  Patient presenting to the ED with depression, AVH, and HI. Doubt acute GI bleed - Hgb has improved fecal positive but slight blood note on exam. discussed case with Dr. Rosalia Hammers and Dr Norlene Campbell - reported patient is okay for [facility]. Negative orthostatics, fainting, dizziness. Pelvic Exam performed with tenderness noted on examination. Wet prep pending. [place] pending. Discussed case with  Elpidio Anis, PA-C. Transfer of care to [employer], PA-C. Patient to get US performed to rule out acute pelvic infection. Patient to be monitored. If all negative, patient can be transferred to [facility] if bed available.   Raymon Mutton, PA-C 08/07/14 9562  Hilario Quarry, MD 08/07/14 1929

## 2014-08-06 NOTE — ED Notes (Signed)
Pt here with HI towards her family without a plan; pt sts not taking psych meds x 1 year; pt admits to cocaine use as well

## 2014-08-06 NOTE — ED Notes (Signed)
Pt given pt belongings bag and scrubs. Pt changing into scrubs. Security  Called to wand pt.

## 2014-08-06 NOTE — BH Assessment (Deleted)
Tele Assessment Note   [name] is an 24 y.o. female.   Axis I: Major Depression, Recurrent severe and Substance Abuse Axis II: Deferred Axis III:  Past Medical History  Diagnosis Date  . Constipation   . Anxiety   . Mental disorder   . ADHD (attention deficit hyperactivity disorder)   . Personality disorder   . Depression   . Anxiety disorder   . Substance abuse    Axis IV: other psychosocial or environmental problems, problems related to social environment and problems with primary support group  Past Medical History:  Past Medical History  Diagnosis Date  . Constipation   . Anxiety   . Mental disorder   . ADHD (attention deficit hyperactivity disorder)   . Personality disorder   . Depression   . Anxiety disorder   . Substance abuse     Past Surgical History  Procedure Laterality Date  . Cesarean section    . Hernia repair    . Flexible sigmoidoscopy N/A 07/06/2014    Procedure: FLEXIBLE SIGMOIDOSCOPY;  Surgeon: Florencia Reasonsobert Buccini V, MD;  [facility];  Service: Endoscopy;  Laterality: N/A;    Family History:  Family History  Problem Relation Age of Onset  . Alcohol abuse Neg Hx   . Arthritis Neg Hx   . Asthma Neg Hx   . Birth defects Neg Hx   . Cancer Neg Hx   . COPD Neg Hx   . Depression Neg Hx   . Diabetes Neg Hx   . Drug abuse Neg Hx   . Early death Neg Hx   . Hearing loss Neg Hx   . Heart disease Neg Hx   . Hyperlipidemia Neg Hx   . Hypertension Neg Hx   . Kidney disease Neg Hx   . Learning disabilities Neg Hx   . Mental illness Neg Hx   . Mental retardation Neg Hx   . Miscarriages / Stillbirths Neg Hx   . Stroke Neg Hx   . Vision loss Neg Hx   . Varicose Veins Neg Hx     Social History:  reports that she has been smoking.  She does not have any smokeless tobacco history on file. She reports that she drinks alcohol. She reports that she uses illicit drugs (Cocaine and Marijuana).  Additional Social History:     CIWA:  CIWA-Ar BP: 128/81 mmHg Pulse Rate: 80 COWS:    PATIENT STRENGTHS: (choose at least two) Communication skills  Allergies:  Allergies  Allergen Reactions  . Latex Hives    Home Medications:  (Not in a hospital admission)  OB/GYN Status:  No LMP recorded. Patient has had an implant.  General Assessment Data Admission Status: Voluntary           Risk to self with the past 6 months Is patient at risk for suicide?: Yes Substance abuse history and/or treatment for substance abuse?: Yes        Mental Status Report Motor Activity: Agitation (Pt is biting her fingernails. )                            Advance Directives (For Healthcare) Does patient have an advance directive?: No Would patient like information on creating an advanced directive?: No - patient declined information          Disposition:     [name], [name] 08/06/2014 8:57 PM

## 2014-08-07 ENCOUNTER — Encounter (HOSPITAL_COMMUNITY): Payer: Self-pay | Admitting: Registered Nurse

## 2014-08-07 ENCOUNTER — Emergency Department (HOSPITAL_COMMUNITY): Payer: Medicaid - Out of State

## 2014-08-07 ENCOUNTER — Inpatient Hospital Stay (HOSPITAL_COMMUNITY)
Admission: EM | Admit: 2014-08-07 | Discharge: 2014-08-11 | DRG: 885 | Disposition: A | Discharge status: Home / Self Care | Payer: Federal, State, Local not specified - Other | Source: Intra-hospital | Attending: Psychiatry | Admitting: Psychiatry

## 2014-08-07 DIAGNOSIS — F319 Bipolar disorder, unspecified: Secondary | ICD-10-CM | POA: Diagnosis present

## 2014-08-07 DIAGNOSIS — F411 Generalized anxiety disorder: Secondary | ICD-10-CM | POA: Diagnosis present

## 2014-08-07 DIAGNOSIS — F609 Personality disorder, unspecified: Secondary | ICD-10-CM | POA: Diagnosis present

## 2014-08-07 DIAGNOSIS — R45850 Homicidal ideations: Secondary | ICD-10-CM | POA: Diagnosis present

## 2014-08-07 DIAGNOSIS — N760 Acute vaginitis: Secondary | ICD-10-CM | POA: Diagnosis present

## 2014-08-07 DIAGNOSIS — F1410 Cocaine abuse, uncomplicated: Secondary | ICD-10-CM | POA: Diagnosis present

## 2014-08-07 DIAGNOSIS — F1420 Cocaine dependence, uncomplicated: Secondary | ICD-10-CM | POA: Diagnosis present

## 2014-08-07 DIAGNOSIS — F3130 Bipolar disorder, current episode depressed, mild or moderate severity, unspecified: Secondary | ICD-10-CM | POA: Diagnosis present

## 2014-08-07 DIAGNOSIS — F32A Depression, unspecified: Secondary | ICD-10-CM | POA: Diagnosis present

## 2014-08-07 DIAGNOSIS — D509 Iron deficiency anemia, unspecified: Secondary | ICD-10-CM | POA: Diagnosis present

## 2014-08-07 DIAGNOSIS — F17200 Nicotine dependence, unspecified, uncomplicated: Secondary | ICD-10-CM | POA: Diagnosis present

## 2014-08-07 DIAGNOSIS — G4700 Insomnia, unspecified: Secondary | ICD-10-CM | POA: Diagnosis present

## 2014-08-07 DIAGNOSIS — F4310 Post-traumatic stress disorder, unspecified: Secondary | ICD-10-CM | POA: Diagnosis present

## 2014-08-07 DIAGNOSIS — Z609 Problem related to social environment, unspecified: Secondary | ICD-10-CM | POA: Diagnosis present

## 2014-08-07 DIAGNOSIS — F1220 Cannabis dependence, uncomplicated: Secondary | ICD-10-CM | POA: Diagnosis present

## 2014-08-07 DIAGNOSIS — F909 Attention-deficit hyperactivity disorder, unspecified type: Secondary | ICD-10-CM | POA: Diagnosis present

## 2014-08-07 DIAGNOSIS — R1084 Generalized abdominal pain: Secondary | ICD-10-CM | POA: Insufficient documentation

## 2014-08-07 DIAGNOSIS — B9689 Other specified bacterial agents as the cause of diseases classified elsewhere: Secondary | ICD-10-CM | POA: Diagnosis present

## 2014-08-07 DIAGNOSIS — F329 Major depressive disorder, single episode, unspecified: Secondary | ICD-10-CM | POA: Diagnosis present

## 2014-08-07 DIAGNOSIS — F322 Major depressive disorder, single episode, severe without psychotic features: Secondary | ICD-10-CM | POA: Diagnosis present

## 2014-08-07 DIAGNOSIS — K219 Gastro-esophageal reflux disease without esophagitis: Secondary | ICD-10-CM | POA: Diagnosis present

## 2014-08-07 DIAGNOSIS — F1994 Other psychoactive substance use, unspecified with psychoactive substance-induced mood disorder: Secondary | ICD-10-CM

## 2014-08-07 LAB — LIPID PANEL
Cholesterol: 151 mg/dL (ref 0–200)
HDL: 81 mg/dL (ref >39)
LDL CALC: 50 mg/dL (ref 0–99)
Total CHOL/HDL Ratio: 1.9 RATIO
Triglycerides: 99 mg/dL (ref <150)
VLDL: 20 mg/dL (ref 0–40)

## 2014-08-07 LAB — GC/CHLAMYDIA PROBE AMP (~~LOC~~) NOT AT ARMC
Chlamydia: POSITIVE — AB
NEISSERIA GONORRHEA: NEGATIVE

## 2014-08-07 LAB — I-STAT CHEM 8, ED
BUN: 13 mg/dL (ref 6–23)
CHLORIDE: 101 meq/L (ref 96–112)
CREATININE: 0.7 mg/dL (ref 0.50–1.10)
Calcium, Ion: 1.12 mmol/L (ref 1.12–1.23)
GLUCOSE: 94 mg/dL (ref 70–99)
HCT: 30 % — ABNORMAL LOW (ref 36.0–46.0)
Hemoglobin: 10.2 g/dL — ABNORMAL LOW (ref 12.0–15.0)
POTASSIUM: 3.5 mmol/L (ref 3.5–5.1)
Sodium: 139 mmol/L (ref 135–145)
TCO2: 23 mmol/L (ref 0–100)

## 2014-08-07 LAB — WET PREP, GENITAL
Trich, Wet Prep: NONE SEEN
YEAST WET PREP: NONE SEEN

## 2014-08-07 LAB — TSH: TSH: 0.423 u[IU]/mL (ref 0.350–4.500)

## 2014-08-07 MED ORDER — ONDANSETRON HCL 4 MG PO TABS: 4.0000 mg | ORAL_TABLET | Freq: Three times a day (TID) | ORAL | Status: DC | PRN

## 2014-08-07 MED ORDER — ACETAMINOPHEN 325 MG PO TABS
650.0000 mg | ORAL_TABLET | ORAL | Status: DC | PRN
  Administered 2014-08-07: 650 mg via ORAL
  Filled 2014-08-07: qty 2

## 2014-08-07 MED ORDER — PANTOPRAZOLE SODIUM 40 MG PO TBEC: 40.0000 mg | DELAYED_RELEASE_TABLET | Freq: Every day | ORAL | Status: DC

## 2014-08-07 MED ORDER — LITHIUM CARBONATE 300 MG PO CAPS
300.0000 mg | ORAL_CAPSULE | Freq: Two times a day (BID) | ORAL | Status: DC
  Administered 2014-08-07 – 2014-08-11 (×8): 300 mg via ORAL
  Filled 2014-08-07 (×11): qty 1

## 2014-08-07 MED ORDER — NICOTINE 21 MG/24HR TD PT24
21.0000 mg | MEDICATED_PATCH | Freq: Every day | TRANSDERMAL | Status: DC
  Administered 2014-08-08 – 2014-08-09 (×2): 21 mg via TRANSDERMAL
  Filled 2014-08-07 (×7): qty 1

## 2014-08-07 MED ORDER — MAGNESIUM HYDROXIDE 400 MG/5ML PO SUSP: 30.0000 mL | Freq: Every day | ORAL | Status: DC | PRN

## 2014-08-07 MED ORDER — TRAZODONE HCL 50 MG PO TABS
50.0000 mg | ORAL_TABLET | Freq: Every evening | ORAL | Status: DC | PRN
  Administered 2014-08-07 – 2014-08-10 (×4): 50 mg via ORAL
  Filled 2014-08-07 (×5): qty 1

## 2014-08-07 MED ORDER — FERROUS SULFATE 325 (65 FE) MG PO TABS
325.0000 mg | ORAL_TABLET | Freq: Two times a day (BID) | ORAL | Status: DC
  Administered 2014-08-07 – 2014-08-11 (×8): 325 mg via ORAL
  Filled 2014-08-07 (×10): qty 1

## 2014-08-07 MED ORDER — PANTOPRAZOLE SODIUM 40 MG PO TBEC
40.0000 mg | DELAYED_RELEASE_TABLET | Freq: Every day | ORAL | Status: DC
  Administered 2014-08-07 – 2014-08-08 (×2): 40 mg via ORAL
  Filled 2014-08-07 (×5): qty 1

## 2014-08-07 MED ORDER — QUETIAPINE FUMARATE 50 MG PO TABS
50.0000 mg | ORAL_TABLET | Freq: Every day | ORAL | Status: DC
  Administered 2014-08-07: 50 mg via ORAL
  Filled 2014-08-07 (×3): qty 1

## 2014-08-07 MED ORDER — FERROUS SULFATE 325 (65 FE) MG PO TABS
325.0000 mg | ORAL_TABLET | Freq: Two times a day (BID) | ORAL | Status: DC
  Administered 2014-08-07: 325 mg via ORAL
  Filled 2014-08-07 (×3): qty 1

## 2014-08-07 MED ORDER — METRONIDAZOLE 500 MG PO TABS
1500.0000 mg | ORAL_TABLET | Freq: Once | ORAL | Status: AC
  Administered 2014-08-07: 1500 mg via ORAL
  Filled 2014-08-07: qty 3

## 2014-08-07 MED ORDER — ALUM & MAG HYDROXIDE-SIMETH 200-200-20 MG/5ML PO SUSP
30.0000 mL | ORAL | Status: DC | PRN
Start: 2014-08-07 — End: 2014-08-11

## 2014-08-07 MED ORDER — ACETAMINOPHEN 325 MG PO TABS
650.0000 mg | ORAL_TABLET | Freq: Four times a day (QID) | ORAL | Status: DC | PRN
  Administered 2014-08-07 – 2014-08-10 (×4): 650 mg via ORAL
  Filled 2014-08-07 (×4): qty 2

## 2014-08-07 NOTE — Progress Notes (Signed)
Patient ID: Olivia RoesStanlesha Rucci, female   DOB: 08/22/1990, 24 y.o.   MRN: [id number] Patient has been resting quietly in her room since admission.

## 2014-08-07 NOTE — Progress Notes (Signed)
Patient did attend the evening karaoke group.  

## 2014-08-07 NOTE — Progress Notes (Addendum)
D:Patient in the hallway on approach.  Patient has flat affect and forwards little.  Patient c/o left abdominal pain.  Patient states she is felling better since her boyfriend came to visit her.  Patient states she does really know why she is here. Patient denies SI/HI and patient states she hears voices and sees shadows.  Patient verbally contracts for safety. A: Staff to monitor Q 15 mins for safety.  Encouragement and support offered.  Scheduled medications administered per orders.  Tylenol administered prn for pain R: Patient remains safe on the unit.  Patient attended group tonight.  Patient visible on the unit.  Patient taking administered medications.

## 2014-08-07 NOTE — [facility] Suicide Risk Assessment (Signed)
   Nursing information obtained from:    Demographic factors:    Current Mental Status:    Loss Factors:    Historical Factors:    Risk Reduction Factors:    Total Time spent with patient: 45 minutes  CLINICAL FACTORS:   Bipolar Disorder:   Mixed State Alcohol/Substance Abuse/Dependencies Previous Psychiatric Diagnoses and Treatments  Psychiatric Specialty Exam: Physical Exam  ROS  Blood pressure 134/80, pulse 70, temperature 98.3 F (36.8 C), temperature source Oral, resp. rate 18, height 5\' 5"  (1.651 m), weight 54.432 kg (120 lb).Body mass index is 19.97 kg/(m^2).  General Appearance: Casual  Eye Contact::  Fair  Speech:  Clear and Coherent  Volume:  Normal  Mood:  Anxious and Depressed  Affect:  Appropriate  Thought Process:  Coherent  Orientation:  Full (Time, Place, and Person)  Thought Content:  Hallucinations: Auditory and Paranoid Ideation  Suicidal Thoughts:  No  Homicidal Thoughts:  Yes.  without intent/plan  Memory:  Immediate;   Fair Recent;   Fair Remote;   Fair  Judgement:  Impaired  Insight:  Lacking  Psychomotor Activity:  Normal  Concentration:  Fair  Recall:  [employer]  Fund of Knowledge:Fair  Language: Fair  Akathisia:  No  Handed:  Right  AIMS (if indicated):     Assets:  Communication Skills Desire for Improvement  Sleep:      Musculoskeletal: Strength & Muscle Tone: within normal limits Gait & Station: normal Patient leans: N/A  COGNITIVE FEATURES THAT CONTRIBUTE TO RISK:  Closed-mindedness Polarized thinking Thought constriction (tunnel vision)    SUICIDE RISK:   Moderate:  Frequent suicidal ideation with limited intensity, and duration, some specificity in terms of plans, no associated intent, good self-control, limited dysphoria/symptomatology, some risk factors present, and identifiable protective factors, including available and accessible social support.  PLAN OF CARE:Patient with hx of Bipolar disorder ,presented with psychosis ,mood  lability ,noncompliance on medications. Patient will be started on Lithium 300 mg po bid for mood lability. TSH ordered. Renal function- wnl. Will continue Seroquel 50 mg po qhs for sleep. Trazodone as needed for now. Labs ordered - will review tomorrow.  Awaiting urine culture - patient with bacterial vaginosis. Patient received Flagyl 2000 mg in ED X 1 dose.   I certify that inpatient services furnished can reasonably be expected to improve the patient's condition.  Graylen Noboa MD 08/07/2014, 3:15 PM
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
## 2014-08-07 NOTE — ED Notes (Addendum)
[facility], states to bring pt after 800.

## 2014-08-07 NOTE — ED Notes (Signed)
[facility] notified that there [facility] a [facility] for [facility], pelvic exam and Ultrasound will be done first.

## 2014-08-07 NOTE — ED Notes (Signed)
All belongings and valuables sent with pt to [facility] 

## 2014-08-07 NOTE — [facility] Group Notes (Signed)
[facility] LCSW Group Therapy  08/07/2014 12:29 PM   Type of Therapy:  Group Therapy  Participation Level:  Did not attend  Summary of Progress/Problems: Today's group focused on relapse prevention.  We defined the term, and then brainstormed on ways to prevent relapse.  Daryel Geraldorth, Kaleb Linquist B 08/07/2014 , 12:29 PM

## 2014-08-07 NOTE — ED Provider Notes (Signed)
[facility]-C at end of shift, pending pelvic ultrasound. US negative. Concern for urinary trichomonas that was not found on wet prep. Cultures pending. Patient is felt medically cleared for placement for homicidal ideation.  Arnoldo HookerShari A Dontavian Marchi, PA-C 08/07/14 [id number]  Hilario Quarryanielle S Ray, MD 08/07/14 1929
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
## 2014-08-07 NOTE — ED Notes (Signed)
[facility] called to transport 
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
## 2014-08-08 DIAGNOSIS — F1590 Other stimulant use, unspecified, uncomplicated: Secondary | ICD-10-CM

## 2014-08-08 DIAGNOSIS — K921 Melena: Secondary | ICD-10-CM

## 2014-08-08 DIAGNOSIS — F1290 Cannabis use, unspecified, uncomplicated: Secondary | ICD-10-CM

## 2014-08-08 DIAGNOSIS — R109 Unspecified abdominal pain: Secondary | ICD-10-CM

## 2014-08-08 LAB — URINE CULTURE

## 2014-08-08 LAB — HEMOGLOBIN A1C
Hgb A1c MFr Bld: 5.1 % (ref <5.7)
Mean Plasma Glucose: 100 mg/dL (ref <117)

## 2014-08-08 LAB — HIV ANTIBODY (ROUTINE TESTING W REFLEX): HIV-1/HIV-2 Ab: NONREACTIVE

## 2014-08-08 MED ORDER — QUETIAPINE FUMARATE 100 MG PO TABS
100.0000 mg | ORAL_TABLET | Freq: Every day | ORAL | Status: DC
  Administered 2014-08-08 – 2014-08-10 (×3): 100 mg via ORAL
  Filled 2014-08-08 (×5): qty 1

## 2014-08-08 MED ORDER — PANTOPRAZOLE SODIUM 40 MG PO TBEC
40.0000 mg | DELAYED_RELEASE_TABLET | Freq: Two times a day (BID) | ORAL | Status: DC
  Administered 2014-08-08 – 2014-08-11 (×6): 40 mg via ORAL
  Filled 2014-08-08 (×8): qty 1

## 2014-08-08 NOTE — Progress Notes (Addendum)
D: Pt denies SI/HI/AV. Pt is pleasant and cooperative. Pt rates depression at a 8, anxiety at a 3, and Helplessness/hopelessness at a 10. Pt complain of blood in her stool and low left stomach pain. Doctor wanted to send her out pt refused. Pt then stopped complaining of pain and also has not mentioned any blood in her stool since the morning.   A: Pt was offered support and encouragement. Pt was given scheduled medications. Pt was encourage to attend groups. Q 15 minute checks were done for safety.  R:Pt attends groups and interacts well with peers and staff. Pt taking medication. Pt has no complaints.Pt receptive to treatment and safety maintained on unit.

## 2014-08-08 NOTE — [facility] Suicide Risk Assessment (Signed)
[facility] INPATIENT:  Family/Significant Other Suicide Prevention Education  Suicide Prevention Education:  Contact Attempts: Olivia SporeCJ Santos (pt's boyfriend) [phone] has been identified by the patient as the family member/significant other with whom the patient will be residing, and identified as the person(s) who will aid the patient in the event of a mental health crisis.  With written consent from the patient, two attempts were made to provide suicide prevention education, prior to and/or following the patient's discharge.  We were unsuccessful in providing suicide prevention education.  A suicide education pamphlet was given to the patient to share with family/significant other.  Date and time of first attempt: 08/08/14 at 2:05PM (voicemail left requesting call back at earliest convenience) Date and time of second attempt:08/11/2014 at 9:40AM (reviewed SPE pamphlet w boyfriend, Olivia SporeCJ Santos, boyfriend had no concerns, states "she's doing the best she's ever done", informed boyfriend that pamphlet had been given to patient to reinforce information given to him over phone.  Boyfriend expressed support for patient.  Santa GeneraAnne Cunningham, LCSW Clinical Social Worker 08/11/2014 9:45 AM   Smart, Lebron QuamHeather LCSWA  08/08/2014, 2:07 PM

## 2014-08-08 NOTE — Progress Notes (Signed)
[facility] MD Progress Note  08/08/2014 12:28 PM [name]  MRN:  [id number] Subjective: Patient states "I am in pain.' Objective: Patient seen and chart reviewed.Patient seen in bed this AM ,in a fetal position, reports severe abdominal pain . Patient rated pain at 10 /10. Patient also reported blood in stool ,reported fresh. O/E - patient with tenderness -more on the LLQ ,no rebound . Hospitalist consult called by NP Aggie. Per Hospitalist patient needs to go to ED - SEE NOTES IN EHR. However patient refused. Patient reported that she has no faith in [facility] doctors and does not want to go to ED ,since she had a bad experience while in ED  prior to coming here . Discussed with patient that we do not have resources to treat her and if she continues to feel her symptoms worsen to let us know.  However per nursing staff patient was seen later on (after a few hours) ,walking ,talking ,as well as having her lunch without any pain concerns or other sx.  Patient continues to report chronic AH/VH ,reports it never goes away. Patient reports depression. Denies any SI/HI. Patient also reports sleep issues.    Diagnosis:   DSM5:  Primary Psychiatric Diagnosis: Bipolar disorder type I depressed ,versus schizoaffective disorder ,bipolar type   Secondary Psychiatric Diagnosis: Stimulant (cocaine) use disorder ,severe  Cannabis use disorder ,severe   Non Psychiatric Diagnosis: Bacterial vaginosis ?PUD    Total Time spent with patient: 30 minutes    ADL's:  Impaired  Sleep: Poor  Appetite:  Fair   Psychiatric Specialty Exam: Physical Exam  Review of Systems  Constitutional: Negative.   HENT: Negative.   Eyes: Negative.   Respiratory: Negative.   Cardiovascular: Negative.   Gastrointestinal: Positive for abdominal pain and blood in stool.  Genitourinary: Negative.   Musculoskeletal: Negative.   Skin: Negative.   Neurological: Negative.   Psychiatric/Behavioral: Positive for  depression, hallucinations and substance abuse. The patient is nervous/anxious and has insomnia.     Blood pressure 134/80, pulse 70, temperature 98.3 F (36.8 C), temperature source Oral, resp. rate 18, height  (1.651 m), weight 54.432 kg (120 lb).Body mass index is 19.97 kg/(m^2).  General Appearance: Disheveled  Eye Solicitor::  Fair  Speech:  Normal Rate  Volume:  Decreased  Mood:  Anxious and Depressed  Affect:  Labile  Thought Process:  Goal Directed  Orientation:  Full (Time, Place, and Person)  Thought Content:  Hallucinations: Auditory Visual- REPORTS CHRONIC AH/VH -never goes away.  Suicidal Thoughts:  No  Homicidal Thoughts:  No  Memory:  Immediate;   Fair Recent;   Fair Remote;   Fair  Judgement:  Impaired  Insight:  Lacking  Psychomotor Activity:  Restlessness  Concentration:  Fair  Recall:  [employer] of Knowledge:Fair  Language: Fair  Akathisia:  No  Handed:  Right  AIMS (if indicated):     Assets:  Communication Skills  Sleep:  Number of Hours: 6.25   Musculoskeletal: Strength & Muscle Tone: within normal limits Gait & Station: normal Patient leans: N/A  Current Medications: Current Facility-Administered Medications  Medication Dose Route Frequency Provider Last Rate Last Dose  . acetaminophen (TYLENOL) tablet 650 mg  650 mg Oral Q6H PRN Shuvon Rankin, NP   650 mg at 08/08/14 0856  . alum & mag hydroxide-simeth (MAALOX/MYLANTA) 200-200-20 MG/5ML suspension 30 mL  30 mL Oral Q4H PRN Shuvon Rankin, NP      . ferrous sulfate tablet 325 mg  325  mg Oral BID WC Shuvon Rankin, NP   325 mg at 08/08/14 0854  . lithium carbonate capsule 300 mg  300 mg Oral BID WC Shuvon Rankin, NP   300 mg at 08/08/14 0853  . magnesium hydroxide (MILK OF MAGNESIA) suspension 30 mL  30 mL Oral Daily PRN Shuvon Rankin, NP      . nicotine (NICODERM CQ - dosed in mg/24 hours) patch 21 mg  21 mg Transdermal Q0600 Shuvon Rankin, NP   21 mg at 08/08/14 0625  . pantoprazole (PROTONIX)  EC tablet 40 mg  40 mg Oral BID Vassie Lollarlos Madera, MD      . QUEtiapine (SEROQUEL) tablet 100 mg  100 mg Oral QHS Jomarie LongsSaramma Finnbar Cedillos, MD      . traZODone (DESYREL) tablet 50 mg  50 mg Oral QHS PRN Shuvon Rankin, NP   50 mg at 08/07/14 2142    Lab Results:  Results for orders placed or performed during the hospital encounter of 08/07/14 (from the past 48 hour(s))  Lipid panel     Status: None   Collection Time: 08/07/14  7:50 PM  Result Value Ref Range   Cholesterol 151 0 - 200 mg/dL   Triglycerides 99 <469<150 mg/dL   HDL 81 >62>39 mg/dL   Total CHOL/HDL Ratio 1.9 RATIO   VLDL 20 0 - 40 mg/dL   LDL Cholesterol 50 0 - 99 mg/dL    Comment:        Total Cholesterol/HDL:CHD Risk Coronary Heart Disease Risk Table                     Men   Women  1/2 Average Risk   3.4   3.3  Average Risk       5.0   4.4  2 X Average Risk   9.6   7.1  3 X Average Risk  23.4   11.0        Use the calculated Patient Ratio above and the CHD Risk Table to determine the patient's CHD Risk.        ATP III CLASSIFICATION (LDL):  <100     mg/dL   Optimal  [id number]  mg/dL   Near or Above                    Optimal  130-159  mg/dL   Borderline  [id number]  mg/dL   High  >027>190     mg/dL   Very High Performed at [facility]   Hemoglobin A1c     Status: None   Collection Time: 08/07/14  7:50 PM  Result Value Ref Range   Hgb A1c MFr Bld 5.1 <5.7 %    Comment: (NOTE)                                                                       According to the ADA Clinical Practice Recommendations for 2011, when HbA1c is used as a screening test:  >=6.5%   Diagnostic of Diabetes Mellitus           (if abnormal result is confirmed) 5.7-6.4%   Increased risk of developing Diabetes Mellitus References:Diagnosis and Classification of Diabetes Mellitus,Diabetes Care,2011,34(Suppl 1):S62-S69 and Standards of Medical Care  in         Diabetes - 2011,Diabetes Care,2011,34 (Suppl 1):S11-S61.    Mean Plasma Glucose 100 <117 mg/dL     Comment: Performed at [facility]  TSH     Status: None   Collection Time: 08/07/14  7:53 PM  Result Value Ref Range   TSH 0.423 0.350 - 4.500 uIU/mL    Comment: Performed at [facility]    Physical Findings: AIMS:  , ,  ,  ,    CIWA:  CIWA-Ar Total: 6 COWS:     Treatment Plan Summary: Daily contact with patient to assess and evaluate symptoms and progress in treatment Medication management   Assessment: Patient is a 24 year old AAF,with hx of bipolar disorder ,cocaine and cannabis abuse ,presented with mood lability as well as her UDS - positive for cocaine and cannabis. Patient also has been very somatic after admission ,c/o abdominal pain . Will start her on Lithium ,seroquel. Will observe progress.   Plan: Will continue  Lithium 300 mg po bid for mood lability.Li level on 08/11/14. Will continue Seroquel, increase dose to 100 mg po qhs  for psychosis. Labs reviewed - TSH -wnl. Patient with Bacterial vaginosis ,treated while in ED. Patient also with ?PUD - on PPI. PPI changed to BID dosing per Hospitalist. Patient to be monitored on the unit. If sx worsen -pt to be transported to ED. Patient will also benefit from a substance abuse program once stable.    Medical Decision Making Problem Points:  Established problem, worsening (2), New problem, with additional work-up planned (4), Review of last therapy session (1) and Review of psycho-social stressors (1) Data Points:  Discuss tests with performing physician (1) Review or order clinical lab tests (1) Review and summation of old records (2) Review of medication regiment & side effects (2) Review of new medications or change in dosage (2)  I certify that inpatient services furnished can reasonably be expected to improve the patient's condition.   Jaylanni Eltringham MD 08/08/2014, 12:28 PM

## 2014-08-08 NOTE — [facility] Group Notes (Signed)
[facility] L[facility] Planning Group Note   08/08/2014 12:05 PM  Participation Quality:  Invited-Chose not to attend.   Smart, American FinancialHeather LCSWA

## 2014-08-08 NOTE — [facility] Group Notes (Signed)
[facility] LCSW Group Therapy  08/08/2014  1:05 PM  Type of Therapy:  Group therapy  Participation Level:  Active  Participation Quality:  Attentive  Affect:  Flat  Cognitive:  Oriented  Insight:  Limited  Engagement in Therapy:  Limited  Modes of Intervention:  Discussion, Socialization  Summary of Progress/Problems:  Chaplain was here to lead a group on themes of hope and courage. "Because of my past, I always thought that I would like a child because I never had a family myself.  I was always shipped off and stuff.  But it was the wrong guy.  And it did not work out like I hoped it wouldSealed Air Corporation."  Went on to explain that she is now in a relationship with someone who has helped her get off drugs, and who is trying to help her get her kids back.  "But before I get my kids back, I have to get myself back, and that's what I'm working on.  And I feel like God has answered my prayers.  I am now cancer free."  Ida Rogueorth, Tayvin Preslar B 08/08/2014 12:33 PM

## 2014-08-08 NOTE — Progress Notes (Addendum)
Received call to examined patient for abd pain and black stools. Patient reports abd pain and staff has not seen black stools. Unfortunately PE along will not help us at this moment, she needs to be seen in ED for CT abd/pelvis and for FOBT; which if positive might required hospital admission for further evaluation and treatment. If negative can [facility] treatment of her mood disorder. Hgb 2 days ago demonstrated a level of 9.5; but she is in her 20's and with active menstrual periods, that is most likely reason for her iron deficiency anemia. Patient is also on iron supplementation and that can turned stool black.  Plan: -send patient to ED for evaluation given concerns of abd pain and concerns of GI bleed. -check FOBT -needs CT/ABD and pelvis -change PPI to BID  Vassie LollMadera, Evah Rashid [phone]

## 2014-08-08 NOTE — Progress Notes (Signed)
Adult Psychoeducational Group Note  Date:  08/08/2014 Time:  11:40 PM  Group Topic/Focus:  Wrap-Up Group:   The focus of this group is to help patients review their daily goal of treatment and discuss progress on daily workbooks.  Participation Level:  Active  Participation Quality:  Appropriate  Affect:  Anxious  Cognitive:  Oriented  Insight: Appropriate  Engagement in Group:  Engaged  Modes of Intervention:  Discussion  Additional Comments:  The patient expressed that her day was difficult.The patient also said that [facility] not [facility].  Octavio Mannshigpen, Dario Yono Lee 08/08/2014, 11:40 PM

## 2014-08-08 NOTE — Tx Team (Signed)
Interdisciplinary Treatment Plan Update (Adult)   Date: 08/08/2014   Time Reviewed: 9:00AM Progress in Treatment:  Attending groups: No Participating in groups:  No Taking medication as prescribed: Yes  Tolerating medication: Yes  Family/Significant othe contact made: No. SPE required-pt refusing family contact. Stated that she would try to get her bf's number for CSW.   Patient understands diagnosis: Minimal insight "I don't know why I'm here." PT reports mood instability, med noncompliance, AVH, and passive SI upon admission.  Discussing patient identified problems/goals with staff: Yes  Medical problems stabilized or resolved: Yes  Denies suicidal/homicidal ideation: Yes during self report.  Patient has not harmed self or Others: Yes  New problem(s) identified:  Discharge Plan or Barriers: Pt reports that she plans to return to her grandfather's home in [place] at d/c and is open [facility]. CSW assessing.  Additional comments: Patient states that she was taken to the ED by her boyfriend "Cause he said I was driving myself insane, cause I be flipping out. (irritability, anger, mood swings). "I had a nervous breakdown." Patient states that she has a lot of frustrations but would not elaborate on what was causing her frustrations. Patient also endorses homicidal ideation towards family but denies intent and plan. Patient denies suicidal ideation and paranoia; but states that she is hearing voices and seeing things. Patient states that states that she see people that she doesn't know and that she is also hearing voices that tell her to do things like hurt herself or others depending on how she is feeling at the time. "I been hearing voices and seeing things since I was 15." Patient has a history of inpatient and outpatient psychiatric services states that she was seeing a Dr. Cliffton [place]White [place] and has been off of he medication for over a year  (Depakote, Wellbutrin, Seroquel, Risperdal, and Xanax). Patient endorse drug use but not elaborate on which drugs she uses "Yes I use drugs; I don't want to tell you what I use; Cause I don't feel like I need to tell you." Patient lives with her grandfather; stating that she moved to [place] months ago from [place]. She has three children ages 1 yr, 2 yr, and 519 months; her sister has her son and her mother has the other 2 children. Patient appears guarded, preoccupied, no eye contact, and covering her ears. Reason for Continuation of Hospitalization: Mood stabilization Medication management  AVH Estimated length of stay: 4-6 days For review of initial/current patient goals, please see plan of care.  Attendees:  Patient:    Family:    Physician: Dr Elna BreslowEappen 08/08/2014 9:00AM  Nursing:  08/08/2014 9:00AM  Clinical Social Worker [employer] Sher[employer]Heather Smart, LCSWA  08/08/2014 9:00AM  Other:  08/08/2014 9:00AM  Other: Darden DatesJennifer C. Nurse CM 08/08/2014 9:00AM  Other: Massie Kluverelores Sutton, Community Care Coordinator  08/08/2014 9:00AM  Other:    Scribe for Treatment Team:  Herbert SetaHeather Smart LCSWA 08/08/2014 9:00AM

## 2014-08-08 NOTE — Progress Notes (Signed)
Writer spoke with patient 1:1 after her visitor (boyfriend) left. She reported that she did not like being cooped up on the hall and became upset and asked that writer leave her alone once it was explained to her the reason for being on this particular hall. She inquired about when her 72 hours would be up so that she could leave. Writer informed patient of 72 hour request for discharge form and explained how it works and she decided not to sign it because she does not want her 72 hours to start from the signing of the form. 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 She reports that she has always heard voices but they do not tell her to hurt herself. She reports that she plans to stay on her medications once discharge. She denies si/hi, support and encouragement given, safety maintained on unit with 15 min checks.

## 2014-08-09 MED ORDER — HYDROXYZINE HCL 50 MG PO TABS
50.0000 mg | ORAL_TABLET | Freq: Three times a day (TID) | ORAL | Status: DC | PRN
  Administered 2014-08-09: 50 mg via ORAL
  Filled 2014-08-09: qty 1
  Filled 2014-08-09: qty 20

## 2014-08-09 NOTE — Plan of Care (Signed)
Problem: Alteration in mood & ability to function due to Goal: STG-Patient will report withdrawal symptoms Outcome: Progressing Pt reported having cravings (cocaine / heroine), feeling agitated / irritable with some cramping (menstrual) and chills on her self inventory sheet.

## 2014-08-09 NOTE — [facility] Group Notes (Signed)
Type of Therapy and Topic: Group Therapy: Avoiding Self-Sabotaging and Enabling Behaviors    Participation Level: Active, supportive, sharing    Mood: Appropriate   Description of Group: Learn how to identify obstacles, self-sabotaging and enabling behaviors, what are they, why do we do them and what needs do these behaviors meet? Discuss unhealthy relationships and how to have positive healthy boundaries with those that sabotage and enable. Explore aspects of self-sabotage and enabling in yourself and how to limit these self-destructive behaviors in everyday life. A scaling question is used to help patient look at where they are now in their motivation to change, from 1 to 10 (lowest to highest motivation).   Therapeutic Goals:  1. Patient will identify one obstacle that relates to self-sabotage and enabling behaviors  2. Patient will identify one personal self-sabotaging or enabling behavior they did prior to admission  3. Patient able to establish a plan to change the above identified behavior they did prior to admission:   4. Patient will demonstrate ability to communicate their needs through discussion and/or role plays.    Therapeutic Modalities:   Cognitive Behavioral Therapy  Person-Centered Therapy  Motivational Interviewing   Patient identified using drugs as a self-sabotaging behavior. Patient discussed her mixed feelings regarding her use and ambivalence for change. Patient enjoys using but also knows that it is not good for her. She identified her values and goals as her independence and getting her children back. CSW explored with patient how her substance use is hindering her progress towards her goals. Patient identified alternative coping skills such as writing music and spending time with her kids. CSW and other group members provided patient with emotional support and encouragement.  Olivia BruinKristin Alyan Santos, MSW,  [employer] IncLCSWA Clinical Social Worker [facility] [phone]

## 2014-08-09 NOTE — Progress Notes (Signed)
D: Pt alert / calm thus far this shift. Appeared irritated at the beginning of the shift, mood labile throughout this shift. Pt denied hopelessness, but reported her depression and anxiety level 8/10 on self inventory sheet. Pt visible in bed at intervals during shift, observed coloring, drawing etc.  A: All medications given as per order including PRN Vistaril 50 mg PO for c/o anxiety. Support and availabitity offered. Q 15 minutes checks maintained as per order without behavioral outburst to note at present.  R: Pt did not attend AM nursing group when prompted. Requested and received PRN Tylenol for menstrual cramps, reported medication was effective in relieving her cramps. Pt stated her goal for today "To stay out of bed more", seen in dayroom at brief intervals during shift. Safety maintained on / off unit. No concerns voiced at this time.

## 2014-08-09 NOTE — [facility] Group Notes (Signed)
[facility] Group Notes:  (Nurse Education Orientation Group)  Date:  08/09/2014  Time: 0930 Type of Therapy:  Nurse Education  Participation Level:  None  Participation Quality:  Resistant  Affect:  Flat  Cognitive:  Alert, Appropriate and Oriented  Insight:  Appropriate  Engagement in Group:  None  Modes of Intervention:  Discussion  Summary of Progress/Problems: Pt did not attend nursing group when prompted by staff.  Sherryl MangesWesseh, Teandre Hamre 08/09/2014,0930

## 2014-08-09 NOTE — Progress Notes (Signed)
[facility] Group Notes:  (Nursing/MHT/Case Management/Adjunct)  Date:  08/09/2014  Time:  9:22 PM  Type of Therapy:  Psychoeducational Skills  Participation Level:  Active  Participation Quality:  Intrusive and Monopolizing  Affect:  Defensive and Excited  Cognitive:  Disorganized  Insight:  Lacking  Engagement in Group:  Distracting and Monopolizing  Modes of Intervention:  Education  Summary of Progress/Problems: The patient was upset in group this evening and was redirected. Patient stated that she was ready to be discharged and was upset that no one has discussed her discharge plans. In addition, the patient complained that she felt very anxious and uneasy over the fact that the hallway door has been locked. As a theme for the day, her support system will be made up of her boyfriend.   [name] S 08/09/2014, 9:22 PM

## 2014-08-09 NOTE — Progress Notes (Addendum)
Patient ID: [name], female   DOB: [birth year], 24 y.o.   MRN: [id number] [facility] MD Progress Note  08/09/2014 4:42 PM [name]  MRN:  [id number] Subjective: Patient states "I am in pain."  "The medications are not working.  I don't know why she put me on them." Objective: Patient seen and chart reviewed.  Patient was observed to be out of room.  When asked if the meds were working, her response was no.  "I was on Depakote and Risperdal and they worked better for me."  Still having abd pain but did not report further blood in stool. Patient continues to report chronic AH/VH ,reports it never goes away. Patient reports depression. Denies any SI/HI. Patient also reports sleep issues.  Diagnosis:   DSM5:  Primary Psychiatric Diagnosis: Bipolar disorder type I depressed ,versus schizoaffective disorder ,bipolar type   Secondary Psychiatric Diagnosis: Stimulant (cocaine) use disorder ,severe  Cannabis use disorder ,severe   Non Psychiatric Diagnosis: Bacterial vaginosis ?PUD   Total Time spent with patient: 30 minutes  ADL's:  Impaired  Sleep: Poor  Appetite:  Fair   Psychiatric Specialty Exam: Physical Exam  ROS  Blood pressure 130/75, pulse 90, temperature 98.7 F (37.1 C), temperature source Oral, resp. rate 18, height  (1.651 m), weight 54.432 kg (120 lb).Body mass index is 19.97 kg/(m^2).  General Appearance: Disheveled  Eye Solicitor::  Fair  Speech:  Normal Rate  Volume:  Decreased  Mood:  Anxious and Depressed  Affect:  Labile  Thought Process:  Goal Directed  Orientation:  Full (Time, Place, and Person)  Thought Content:  Hallucinations: Auditory Visual- REPORTS CHRONIC AH/VH -never goes away.  Suicidal Thoughts:  No  Homicidal Thoughts:  No  Memory:  Immediate;   Fair Recent;   Fair Remote;   Fair  Judgement:  Impaired  Insight:  Lacking  Psychomotor Activity:  Restlessness  Concentration:  Fair  Recall:  [employer] of Knowledge:Fair   Language: Fair  Akathisia:  No  Handed:  Right  AIMS (if indicated):     Assets:  Communication Skills  Sleep:  Number of Hours: 6.5   Musculoskeletal: Strength & Muscle Tone: within normal limits Gait & Station: normal Patient leans: N/A  Current Medications: Current Facility-Administered Medications  Medication Dose Route Frequency Provider Last Rate Last Dose  . acetaminophen (TYLENOL) tablet 650 mg  650 mg Oral Q6H PRN Shuvon Rankin, NP   650 mg at 08/09/14 1204  . alum & mag hydroxide-simeth (MAALOX/MYLANTA) 200-200-20 MG/5ML suspension 30 mL  30 mL Oral Q4H PRN Shuvon Rankin, NP      . ferrous sulfate tablet 325 mg  325 mg Oral BID WC Shuvon Rankin, NP   325 mg at 08/09/14 1629  . lithium carbonate capsule 300 mg  300 mg Oral BID WC Shuvon Rankin, NP   300 mg at 08/09/14 1629  . magnesium hydroxide (MILK OF MAGNESIA) suspension 30 mL  30 mL Oral Daily PRN Shuvon Rankin, NP      . nicotine (NICODERM CQ - dosed in mg/24 hours) patch 21 mg  21 mg Transdermal Q0600 Shuvon Rankin, NP   21 mg at 08/09/14 0840  . pantoprazole (PROTONIX) EC tablet 40 mg  40 mg Oral BID Vassie Loll, MD   40 mg at 08/09/14 1629  . QUEtiapine (SEROQUEL) tablet 100 mg  100 mg Oral QHS Jomarie Longs, MD   100 mg at 08/08/14 2120  . traZODone (DESYREL) tablet 50 mg  50 mg Oral  QHS PRN Shuvon Rankin, NP   50 mg at 08/08/14 2120    Lab Results:  Results for orders placed or performed during the hospital encounter of 08/07/14 (from the past 48 hour(s))  Lipid panel     Status: None   Collection Time: 08/07/14  7:50 PM  Result Value Ref Range   Cholesterol 151 0 - 200 mg/dL   Triglycerides 99 <161 mg/dL   HDL 81 >09 mg/dL   Total CHOL/HDL Ratio 1.9 RATIO   VLDL 20 0 - 40 mg/dL   LDL Cholesterol 50 0 - 99 mg/dL    Comment:        Total Cholesterol/HDL:CHD Risk Coronary Heart Disease Risk Table                     Men   Women  1/2 Average Risk   3.4   3.3  Average Risk       5.0   4.4  2 X Average  Risk   9.6   7.1  3 X Average Risk  23.4   11.0        Use the calculated Patient Ratio above and the CHD Risk Table to determine the patient's CHD Risk.        ATP III CLASSIFICATION (LDL):  <100     mg/dL   Optimal  604-540  mg/dL   Near or Above                    Optimal  130-159  mg/dL   Borderline  981-191  mg/dL   High  >478     mg/dL   Very High Performed at [facility]   Hemoglobin A1c     Status: None   Collection Time: 08/07/14  7:50 PM  Result Value Ref Range   Hgb A1c MFr Bld 5.1 <5.7 %    Comment: (NOTE)                                                                       According to the ADA Clinical Practice Recommendations for 2011, when HbA1c is used as a screening test:  >=6.5%   Diagnostic of Diabetes Mellitus           (if abnormal result is confirmed) 5.7-6.4%   Increased risk of developing Diabetes Mellitus References:Diagnosis and Classification of Diabetes Mellitus,Diabetes Care,2011,34(Suppl 1):S62-S69 and Standards of Medical Care in         Diabetes - 2011,Diabetes Care,2011,34 (Suppl 1):S11-S61.    Mean Plasma Glucose 100 <117 mg/dL    Comment: Performed at [facility]  TSH     Status: None   Collection Time: 08/07/14  7:53 PM  Result Value Ref Range   TSH 0.423 0.350 - 4.500 uIU/mL    Comment: Performed at [facility]    Physical Findings: AIMS:  , ,  ,  ,    CIWA:  CIWA-Ar Total: 6 COWS:     Treatment Plan Summary: Daily contact with patient to assess and evaluate symptoms and progress in treatment Medication management   Assessment: Patient is a 24 year old AAF,with hx of bipolar disorder ,cocaine and cannabis abuse ,presented with mood lability as  well as her UDS - positive for cocaine and cannabis. Patient also has been very somatic after admission ,c/o abdominal pain . Will start her on Lithium ,seroquel. Will observe progress.   Plan: Will continue  Lithium 300 mg po bid for mood lability.Li level on  08/11/14. Will continue Seroquel, increase dose to 100 mg po qhs  for psychosis. Added Hydroxyzine 50 mg TID PRN anxiety Labs reviewed - TSH -wnl. Patient with Bacterial vaginosis ,treated while in ED. Patient also with ?PUD - on PPI. PPI changed to BID dosing per Hospitalist. Patient to be monitored on the unit. If sx worsen -pt to be transported to ED. Patient will also benefit from a substance abuse program once stable.    Medical Decision Making Problem Points:  Established problem, worsening (2), New problem, with additional work-up planned (4), Review of last therapy session (1) and Review of psycho-social stressors (1) Data Points:  Discuss tests with performing physician (1) Review or order clinical lab tests (1) Review and summation of old records (2) Review of medication regiment & side effects (2) Review of new medications or change in dosage (2)  I certify that inpatient services furnished can reasonably be expected to improve the patient's condition.   Adonis BrookAGUSTIN, SHEILA MD 08/09/2014, 4:42 PM  Reviewed the information documented and agree with the treatment plan.  Ryan Palermo,JANARDHAHA R. 08/12/2014 2:02 PM

## 2014-08-09 NOTE — Plan of Care (Signed)
Problem: Alteration in mood & ability to function due to Goal: STG-Patient will comply with prescribed medication regimen (Patient will comply with prescribed medication regimen)  Outcome: Progressing Patient is compliant with scheduled medications.     

## 2014-08-10 DIAGNOSIS — F1490 Cocaine use, unspecified, uncomplicated: Secondary | ICD-10-CM

## 2014-08-10 NOTE — Progress Notes (Signed)
Writer has observed patient out of her room a little more this evening. She complains of not wanting to be here and got very upset with Clinical research associatewriter when I asked her calm down from using so much profanity on the phone this evening. She was talking to her boyfriend on the phone and was cursing saying," if I don't get out of here today I'm going to tear some shit up around this mother fu--er. Patient was asked to calm down or she would have to end her call. She got off the phone and threatened to Network engineerhit writer. She did not want Clinical research associatewriter as her nurse any longer Estate manager/land agentand writer had another co-worker to medicate her. She returned to the dayroom where she continued to talk about the incident to another patient cursing and talking loudly. Patient remains safe on unit with 15 min checks.

## 2014-08-10 NOTE — Progress Notes (Signed)
Pt did not attend nurse education group this morning.  

## 2014-08-10 NOTE — [facility] Group Notes (Signed)
[facility] LCSW Group Therapy  08/10/2014   11:00 AM   Type of Therapy:  Group Therapy  Participation Level:  Active  Participation Quality:  Appropriate and Attentive  Affect:  Appropriate, Irritable  Cognitive:  Alert and Appropriate  Insight:  Developing/Improving and Engaged  Engagement in Therapy:  Developing/Improving and Engaged  Modes of Intervention:  Clarification, Confrontation, Discussion, Education, Exploration, Limit-setting, Orientation, Problem-solving, Rapport Building, Dance movement psychotherapisteality Testing, Socialization and Support  Summary of Progress/Problems: The main focus of today's process group was to identify the patient's current support system and decide on other supports that can be put in place.  An emphasis was placed on using counselor, doctor, therapy groups, 12-step groups, and problem-specific support groups to expand supports, as well as doing something different than has been done before. Pt was focused on when she will be able to discharge, stating she doesn't know why she is here, when she came or what her discharge plan is.  Pt also shared that her boyfriend is supportive because he is uplifting and visits her daily.  Pt states that the devil is negative because he has made her do a lot of bad things lately.  Pt actively participated in group discussion.     Olivia IvanChelsea Horton, LCSW 08/10/2014 11:46 AM

## 2014-08-10 NOTE — Progress Notes (Signed)
Adult Psychoeducational Group Note  Date:  08/10/2014 Time:  9:47 PM  Group Topic/Focus:  Wrap-Up Group:   The focus of this group is to help patients review their daily goal of treatment and discuss progress on daily workbooks.  Participation Level:  Active  Participation Quality:  Appropriate  Affect:  Appropriate  Cognitive:  Appropriate  Insight: Appropriate  Engagement in Group:  Engaged  Modes of Intervention:  Discussion  Additional Comments: The patient expressed that her day was fair.The patient also said that her support system is her boyfriend.  Octavio Mannshigpen, Jaquese Irving Lee 08/10/2014, 9:47 PM

## 2014-08-10 NOTE — Progress Notes (Signed)
D: Pt denies SI/HI/AV. Pt is pleasant and cooperative. Pt rates depression at a 4, anxiety at a 5, and Helplessness/hopelessness at a 0.  A: Pt was offered support and encouragement. Pt was given scheduled medications. Pt was encourage to attend groups. Q 15 minute checks were done for safety.  R:Pt did not groups and interacts well with peers and staff. Pt taking medication. Pt wants to go home but was redirected to speak to doctor about that. Pt is not that receptive to treatment and safety maintained on unit.

## 2014-08-10 NOTE — Progress Notes (Signed)
Patient ID: [name], female   DOB: 1991-03-18, 24 y.o.   MRN: [id number]    [facility] MD Progress Note  08/10/2014 6:13 PM Marny Smethers  MRN:  [id number] Subjective: Patient states "I had a break down. There is nothing wrong now. I need to leave today. I don't understand why any blood-work would have to be drawn for that. I'm done talking for today."   Objective: Patient seen and chart reviewed.  The patient was assessed in her room. She was noted to be very irritable upon assessment especially when informed discharge was not scheduled for today. The patient has been reporting hearing voices but denies today. She does not appear to be truthful about her symptoms. Nursing notes indicate the patient's mood was very labile last night. She was redirected by staff to not curse loudly when talking on the phone. Also overheard to make threats to cause property destruction if not discharged in a timely fashion. After the phone call the patient threatened to hit her nurse. Patient informed that she is scheduled for a Lithium level tomorrow morning, which further agitated her.   Diagnosis:   DSM5:  Primary Psychiatric Diagnosis: Bipolar disorder type I depressed   Secondary Psychiatric Diagnosis: Stimulant (cocaine) use disorder ,severe  Cannabis use disorder ,severe  Non Psychiatric Diagnosis: Bacterial vaginosis ?PUD   Total Time spent with patient: 20 minutes  ADL's:  Intact  Sleep: Good  Appetite:  Fair   Psychiatric Specialty Exam: Physical Exam  Review of Systems  Constitutional: Negative.   HENT: Negative.   Eyes: Negative.   Respiratory: Negative.   Cardiovascular: Negative.   Gastrointestinal: Negative.   Genitourinary: Negative for dysuria, urgency, frequency, hematuria and flank pain.  Musculoskeletal: Negative.   Skin: Negative.   Neurological: Negative.   Endo/Heme/Allergies: Negative.   Psychiatric/Behavioral: Positive for hallucinations and substance abuse. The  patient is nervous/anxious.     Blood pressure 125/67, pulse 93, temperature 98.9 F (37.2 C), temperature source Oral, resp. rate 16, height  (1.651 m), weight 54.432 kg (120 lb).Body mass index is 19.97 kg/(m^2).  General Appearance: Disheveled  Eye Solicitor::  Fair  Speech:  Normal Rate  Volume:  Decreased  Mood:  Anxious and Irritable  Affect:  Labile  Thought Process:  Goal Directed  Orientation:  Full (Time, Place, and Person)  Thought Content:  Hallucinations: Auditory Visual- REPORTS CHRONIC AH/VH -never goes away.  Suicidal Thoughts:  No  Homicidal Thoughts:  No  Memory:  Immediate;   Fair Recent;   Fair Remote;   Fair  Judgement:  Impaired  Insight:  Lacking  Psychomotor Activity:  Restlessness  Concentration:  Fair  Recall:  [employer] of Knowledge:Fair  Language: Fair  Akathisia:  No  Handed:  Right  AIMS (if indicated):     Assets:  Communication Skills Desire for Improvement Physical Health Resilience  Sleep:  Number of Hours: 6.75   Musculoskeletal: Strength & Muscle Tone: within normal limits Gait & Station: normal Patient leans: N/A  Current Medications: Current Facility-Administered Medications  Medication Dose Route Frequency Provider Last Rate Last Dose  . acetaminophen (TYLENOL) tablet 650 mg  650 mg Oral Q6H PRN Shuvon Rankin, NP   650 mg at 08/10/14 1131  . alum & mag hydroxide-simeth (MAALOX/MYLANTA) 200-200-20 MG/5ML suspension 30 mL  30 mL Oral Q4H PRN Shuvon Rankin, NP      . ferrous sulfate tablet 325 mg  325 mg Oral BID WC Shuvon Rankin, NP   325 mg at  08/10/14 1732  . hydrOXYzine (ATARAX/VISTARIL) tablet 50 mg  50 mg Oral TID PRN Lindwood QuaSheila May Agustin, NP   50 mg at 08/09/14 1708  . lithium carbonate capsule 300 mg  300 mg Oral BID WC Shuvon Rankin, NP   300 mg at 08/10/14 1731  . magnesium hydroxide (MILK OF MAGNESIA) suspension 30 mL  30 mL Oral Daily PRN Shuvon Rankin, NP      . nicotine (NICODERM CQ - dosed in mg/24 hours) patch 21  mg  21 mg Transdermal Q0600 Shuvon Rankin, NP   21 mg at 08/09/14 0840  . pantoprazole (PROTONIX) EC tablet 40 mg  40 mg Oral BID Vassie Lollarlos Madera, MD   40 mg at 08/10/14 1732  . QUEtiapine (SEROQUEL) tablet 100 mg  100 mg Oral QHS Jomarie LongsSaramma Eappen, MD   100 mg at 08/09/14 2147  . traZODone (DESYREL) tablet 50 mg  50 mg Oral QHS PRN Shuvon Rankin, NP   50 mg at 08/09/14 2147    Lab Results:  No results found for this or any previous visit (from the past 48 hour(s)).  Physical Findings: AIMS:  , ,  ,  ,    CIWA:  CIWA-Ar Total: 6 COWS:     Treatment Plan Summary: Daily contact with patient to assess and evaluate symptoms and progress in treatment Medication management   Assessment: Patient is a 24 year old AAF,with hx of bipolar disorder ,cocaine and cannabis abuse ,presented with mood lability as well as her UDS - positive for cocaine and cannabis. Patient also has been very somatic after admission ,c/o abdominal pain . Will start her on Lithium ,seroquel. Will observe progress.  Plan: Will continue  Lithium 300 mg po bid for mood lability. Li level on 08/11/14. Will continue Seroquel at 100 mg po qhs for psychosis.  Continue Hydroxyzine 50 mg TID PRN anxiety Labs reviewed - TSH -wnl. Patient with Bacterial vaginosis,treated while in ED. Patient will also benefit from a substance abuse program once stable.  Medical Decision Making Problem Points:  Established problem, stable/improving (1), Review of last therapy session (1) and Review of psycho-social stressors (1) Data Points:  Review or order clinical lab tests (1) Review of medication regiment & side effects (2)  I certify that inpatient services furnished can reasonably be expected to improve the patient's condition.   DAVIS, LAURA NP-C 08/10/2014, 6:13 PM  Reviewed the information documented and agree with the treatment plan.  Nyelle Wolfson,JANARDHAHA R. 08/12/2014 2:07 PM

## 2014-08-11 DIAGNOSIS — N760 Acute vaginitis: Secondary | ICD-10-CM

## 2014-08-11 DIAGNOSIS — F1220 Cannabis dependence, uncomplicated: Secondary | ICD-10-CM

## 2014-08-11 DIAGNOSIS — F1420 Cocaine dependence, uncomplicated: Secondary | ICD-10-CM

## 2014-08-11 DIAGNOSIS — R1084 Generalized abdominal pain: Secondary | ICD-10-CM

## 2014-08-11 DIAGNOSIS — A499 Bacterial infection, unspecified: Secondary | ICD-10-CM

## 2014-08-11 LAB — LITHIUM LEVEL: Lithium Lvl: 0.34 mmol/L — ABNORMAL LOW (ref 0.80–1.40)

## 2014-08-11 MED ORDER — QUETIAPINE FUMARATE 100 MG PO TABS: 100.0000 mg | ORAL_TABLET | Freq: Every day | ORAL | Status: DC

## 2014-08-11 MED ORDER — ETONOGESTREL 68 MG ~~LOC~~ IMPL: 1.0000 | DRUG_IMPLANT | Freq: Once | SUBCUTANEOUS | Status: DC

## 2014-08-11 MED ORDER — TRAZODONE HCL 50 MG PO TABS: 50.0000 mg | ORAL_TABLET | Freq: Every evening | ORAL | Status: DC | PRN

## 2014-08-11 MED ORDER — LITHIUM CARBONATE 300 MG PO CAPS
600.0000 mg | ORAL_CAPSULE | Freq: Every day | ORAL | Status: DC
  Filled 2014-08-11: qty 2
  Filled 2014-08-11: qty 28

## 2014-08-11 MED ORDER — LITHIUM CARBONATE 300 MG PO CAPS
300.0000 mg | ORAL_CAPSULE | Freq: Every day | ORAL | Status: DC
  Filled 2014-08-11: qty 14
  Filled 2014-08-11: qty 1

## 2014-08-11 MED ORDER — LITHIUM CARBONATE 300 MG PO CAPS: 300.0000 mg | ORAL_CAPSULE | Freq: Every day | ORAL | Status: DC

## 2014-08-11 MED ORDER — FERROUS SULFATE 325 (65 FE) MG PO TABS: 325.0000 mg | ORAL_TABLET | Freq: Two times a day (BID) | ORAL | Status: DC

## 2014-08-11 MED ORDER — LITHIUM CARBONATE 600 MG PO CAPS: 600.0000 mg | ORAL_CAPSULE | Freq: Every day | ORAL | Status: DC

## 2014-08-11 MED ORDER — DSS 100 MG PO CAPS: 100.0000 mg | ORAL_CAPSULE | Freq: Two times a day (BID) | ORAL | Status: DC

## 2014-08-11 MED ORDER — LITHIUM CARBONATE 300 MG PO CAPS: 300.0000 mg | ORAL_CAPSULE | Freq: Two times a day (BID) | ORAL | Status: DC

## 2014-08-11 MED ORDER — PANTOPRAZOLE SODIUM 40 MG PO TBEC: 40.0000 mg | DELAYED_RELEASE_TABLET | Freq: Every day | ORAL | Status: DC

## 2014-08-11 MED ORDER — POLYETHYLENE GLYCOL 3350 17 GM/SCOOP PO POWD: ORAL | Status: DC

## 2014-08-11 MED ORDER — HYDROXYZINE HCL 50 MG PO TABS: 50.0000 mg | ORAL_TABLET | Freq: Three times a day (TID) | ORAL | Status: DC | PRN

## 2014-08-11 NOTE — Progress Notes (Signed)
D. Pt had been up and active in milieu this evening, did attend and participate in evening group activity. Pt spoke about the need to be discharged sometime in the morning as she reports that she has a 12:30 pm appointment that she needs to attend to. Pt did not verbalize any other concerns except for this one. Pt did receive medications without incident this evening and did not verbalize any complaints of pain. A. Support and encouragement provided. R. Safety maintained, will continue to monitor.

## 2014-08-11 NOTE — Progress Notes (Signed)
[facility]  Will you be returning to the same living situation after discharge: Yes,  will live in [place] w grandfather, states she will return to [place] at end of month and reengage w prior treatment providers At discharge, do you have transportation home?:Yes,  boyfriend will transport Do you have the ability to pay for your medications:Yes,  has insurance  Release of information consent forms completed and in the chart;  Patient's signature needed at discharge.  Patient to Follow up at: Follow-up Information    Follow up with [facility].   Why:  Walk in between 8am-9am Monday through Friday for hospital follow-up/medication management/assessment for therapy services.    Contact information:   [address]  [place], [place] [address] Phone: [phone] Fax: [phone]      Patient denies SI/HI:   Yes,  patient denies    Safety Planning and Suicide Prevention discussed:  Yes,  reviewed w patient and boyfriend by phone, pamphlet given to patient  Patient refused referral  [name] 08/11/2014, 9:45 AM

## 2014-08-11 NOTE — Discharge Summary (Signed)
Physician Discharge Summary Note  Patient:  [name] is an 24 y.o., female MRN:  [id number] DOB:  1991/02/04 Patient phone:  [phone] (home)  Patient address:   [address] [place] [address],  Total Time spent with patient: 30 minutes  Date of Admission:  08/07/2014 Date of Discharge: 08/11/14  Reason for Admission:  Mood stabilization treatments, Acute psychosis  Discharge Diagnoses: Principal Problem:   Cannabis use disorder, severe, dependence Active Problems:   Bipolar I disorder, most recent episode depressed   Bacterial vaginosis   Cocaine use disorder, moderate, dependence   Generalized abdominal pain  Psychiatric Specialty Exam: Physical Exam  Psychiatric: She has a normal mood and affect. Her speech is normal and behavior is normal. Judgment and thought content normal. Cognition and memory are normal.    Review of Systems  Constitutional: Negative.   HENT: Negative.   Eyes: Negative.   Respiratory: Negative.   Cardiovascular: Negative.   Gastrointestinal: Negative.   Genitourinary: Negative.   Musculoskeletal: Negative.   Skin: Negative.   Neurological: Negative.   Endo/Heme/Allergies: Negative.   Psychiatric/Behavioral: Positive for hallucinations (Stabilized with treatment ) and substance abuse (UDS on admission positive for cocaine/marijuana ). Negative for depression, suicidal ideas and memory loss. The patient is not nervous/anxious and does not have insomnia.     Blood pressure 115/72, pulse 84, temperature 98 F (36.7 C), temperature source Oral, resp. rate 18, height  (1.651 m), weight 54.432 kg (120 lb).Body mass index is 19.97 kg/(m^2).  See Physician SRA     Past Psychiatric History: Diagnosis: Bipolar Disorder, depressed, Depression, Anxiety, ADHD, Personality Disorder  Hospitalizations: Several prior inpatient hospitalization in [place], [place] and [place]  Outpatient Care: Dr. Cliffton Asters in [place], [place] (may be  primary physician  Substance Abuse Care: Denies care but has chronic use of cocaine   Self-Mutilation: History of cutting; denies at this time  Suicidal Attempts: Endorses prior suicide attempt  Violent Behaviors: Denies   Musculoskeletal: Strength & Muscle Tone: within normal limits Gait & Station: normal Patient leans: N/A  DSM5: Primary Psychiatric Diagnosis: Bipolar disorder type I depressed (IMPROVED)  Secondary Psychiatric Diagnosis: Stimulant (cocaine) use disorder ,severe  Cannabis use disorder ,severe  Non Psychiatric Diagnosis: Bacterial vaginosis ?PUD   Past Medical History  Diagnosis Date  . Constipation   . Anxiety   . Mental disorder   . ADHD (attention deficit hyperactivity disorder)   . Personality disorder   . Depression   . Anxiety disorder   . Substance abuse    Level of Care:  OP  Hospital Course:  [name] is a 24 y.o. female who voluntarily presents to [facility] with HI thoughts and auditory hallucinations. Pt reports the following: she's been experiencing HI thoughts towards her family for approx 1 month but has no plan or intent to harm them. She says she is stressed out with school and family. Pt admits she hears voice with command "all her life", stating they tell her derogatory things and it's a female voice. Pt has also been seeing shadows.          [name] was admitted to the adult unit where she was evaluated and her symptoms were identified. Medication management was discussed and implemented. The patient was started on Lithium for improved mood stability. Her dose prior to discharge was increased to 300 mg in the morning and 600 mg at bedtime. This was in response to a Lithium level drawn on the morning of discharge that  was noted to be 0.34. The patient was also started on Seroquel 100 mg at hs for the psychotic symptoms mentioned above. She was encouraged to participate in unit programming. Medical  problems were identified and treated appropriately. She was treated in the ED with Flagyl 2000 mg one time dose for bacterial vaginosis. Home medication was restarted as needed.  She was evaluated each day by a clinical provider to ascertain the patient's response to treatment.  Improvement was noted by the patient's report of decreasing symptoms, improved sleep and appetite, affect, medication tolerance, behavior, and participation in unit programming.  The patient was asked each day to complete a self inventory noting mood, mental status, pain, new symptoms, anxiety and concerns.         She responded well to medication and being in a therapeutic and supportive environment. Her mood continued to be very irritable at times and could become verbally aggressive with staff. The patient's main complaint on admission was her tendency to "flip out" Positive and appropriate behavior was noted and the patient was motivated for recovery.  She worked closely with the treatment team and case manager to develop a discharge plan with appropriate goals. Coping skills, problem solving as well as relaxation therapies were also part of the unit programming.         By the day of discharge she was in much improved condition than upon admission.  Symptoms were reported as significantly decreased or resolved completely. The patient denied SI/HI and voiced no AVH. She was motivated to continue taking medication with a goal of continued improvement in mental health.  [name] was discharged home with a plan to follow up as noted below. Patient was provided with prescriptions and medication samples at time of discharge. She left [facility] in stable condition with all belongings returned to her. Patient will need to have a Lithium level drawn five days after discharge to monitor if the medication is in the therapeutic range.   Consults:  None  Significant Diagnostic Studies:  UDS positive for cocaine and marijuana, Lithium level,  TSH, Lipid profile, Chemistry panel, CBC  Discharge Vitals:   Blood pressure 115/72, pulse 84, temperature 98 F (36.7 C), temperature source Oral, resp. rate 18, height 5\' 5"  (1.651 m), weight 54.432 kg (120 lb). Body mass index is 19.97 kg/(m^2). Lab Results:   Results for orders placed or performed during the hospital encounter of 08/07/14 (from the past 72 hour(s))  Lithium level     Status: Abnormal   Collection Time: 08/11/14  6:25 AM  Result Value Ref Range   Lithium Lvl 0.34 (L) 0.80 - 1.40 mmol/L    Comment: Performed at [facility]    Physical Findings: AIMS:  , ,  ,  ,    CIWA:  CIWA-Ar Total: 6 COWS:     Psychiatric Specialty Exam: See Psychiatric Specialty Exam and Suicide Risk Assessment completed by Attending Physician prior to discharge.  Discharge destination:  Home  Is patient on multiple antipsychotic therapies at discharge:  No   Has Patient had three or more failed trials of antipsychotic monotherapy by history:  No  Recommended Plan for Multiple Antipsychotic Therapies: NA     Medication List    TAKE these medications      Indication   DSS 100 MG Caps  Take 100 mg by mouth 2 (two) times daily.   Indication:  Constipation     etonogestrel 68 MG Impl implant  Commonly known as:  NEXPLANON  1 each (68 mg total) by Subdermal route once. 02/2013   Indication:  Pregnancy     ferrous sulfate 325 (65 FE) MG tablet  Take 1 tablet (325 mg total) by mouth 2 (two) times daily with a meal.   Indication:  Iron Deficiency     hydrOXYzine 50 MG tablet  Commonly known as:  ATARAX/VISTARIL  Take 1 tablet (50 mg total) by mouth 3 (three) times daily as needed for anxiety.   Indication:  Anxiety Neurosis     lithium 600 MG capsule  Take 1 capsule (600 mg total) by mouth daily with supper.   Indication:  Manic-Depression     lithium carbonate 300 MG capsule  Take 1 capsule (300 mg total) by mouth daily with breakfast.  Start taking on:   08/12/2014   Indication:  Manic-Depression     pantoprazole 40 MG tablet  Commonly known as:  PROTONIX  Take 1 tablet (40 mg total) by mouth daily.   Indication:  Gastroesophageal Reflux Disease     polyethylene glycol powder powder  Commonly known as:  GLYCOLAX  Take one capful (17 g) in 4 ounces (1/2 cup) of liquid each morning.   Indication:  Constipation     QUEtiapine 100 MG tablet  Commonly known as:  SEROQUEL  Take 1 tablet (100 mg total) by mouth at bedtime.   Indication:  Depressive Phase of Manic-Depression     traZODone 50 MG tablet  Commonly known as:  DESYREL  Take 1 tablet (50 mg total) by mouth at bedtime as needed for sleep.   Indication:  Trouble Sleeping       Follow-up Information    Follow up with [facility].   Why:  Walk in between 8am-9am Monday through Friday for hospital follow-up/medication management/assessment for therapy services.    Contact information:   [address], [place] [address] Phone: [phone] Fax: [phone]      Follow-up recommendations:   Activity: no restrictions Diet: regular  Comments:   Take all your medications as prescribed by your mental healthcare provider.  Report any adverse effects and or reactions from your medicines to your outpatient provider promptly.  Patient is instructed and cautioned to not engage in alcohol and or illegal drug use while on prescription medicines.  In the event of worsening symptoms, patient is instructed to call the crisis hotline, 911 and or go to the nearest ED for appropriate evaluation and treatment of symptoms.  Follow-up with your primary care provider for your other medical issues, concerns and or health care needs.   Total Discharge Time:  Greater than 30 minutes.  SignedFransisca Kaufmann NP-C 08/11/2014, 6:37 PM

## 2014-08-11 NOTE — [facility] Suicide Risk Assessment (Signed)
   Demographic Factors:  Unemployed  Total Time spent with patient: 30 minutes  Psychiatric Specialty Exam: Physical Exam  Review of Systems  Constitutional: Negative.   HENT: Negative.   Eyes: Negative.   Respiratory: Negative.   Cardiovascular: Negative.   Gastrointestinal: Negative.   Genitourinary: Negative.   Musculoskeletal: Negative.   Skin: Negative.   Neurological: Negative.   Psychiatric/Behavioral: Negative for suicidal ideas and hallucinations.    Blood pressure 115/72, pulse 84, temperature 98 F (36.7 C), temperature source Oral, resp. rate 18, height 5\' 5"  (1.651 m), weight 54.432 kg (120 lb).Body mass index is 19.97 kg/(m^2).  General Appearance: Casual  Eye Contact::  Fair  Speech:  Clear and Coherent  Volume:  Normal  Mood:  Euthymic  Affect:  Congruent  Thought Process:  Goal Directed  Orientation:  Full (Time, Place, and Person)  Thought Content:  WDL  Suicidal Thoughts:  No  Homicidal Thoughts:  No  Memory:  Immediate;   Fair Recent;   Fair Remote;   Fair  Judgement:  Fair  Insight:  Fair  Psychomotor Activity:  Normal  Concentration:  Fair  Recall:  [employer]  Fund of Knowledge:Fair  Language: Fair  Akathisia:  No  Handed:  Right  AIMS (if indicated):     Assets:  Housing Social Support  Sleep:  Number of Hours: 6.75    Musculoskeletal: Strength & Muscle Tone: within normal limits Gait & Station: normal Patient leans: N/A   Mental Status Per Nursing Assessment::   On Admission:     Current Mental Status by Physician: Patient denies SI/HI/AH/VH  Loss Factors: Patient is unemployed ,[place] ,has relational struggles,is single mother  Historical Factors: Impulsivity  Risk Reduction Factors:   Positive social support  Continued Clinical Symptoms:  Alcohol/Substance Abuse/Dependencies Previous Psychiatric Diagnoses and Treatments  Cognitive Features That Contribute To Risk:  Polarized thinking    Suicide  Risk:  Minimal: No identifiable suicidal ideation.     Discharge Diagnoses:  DSM5:  Primary Psychiatric Diagnosis: Bipolar disorder type I depressed (IMPROVED)  Secondary Psychiatric Diagnosis: Stimulant (cocaine) use disorder ,severe  Cannabis use disorder ,severe  Non Psychiatric Diagnosis: Bacterial vaginosis ?PUD   Past Medical History  Diagnosis Date  . Constipation   . Anxiety   . Mental disorder   . ADHD (attention deficit hyperactivity disorder)   . Personality disorder   . Depression   . Anxiety disorder   . Substance abuse     Plan Of Care/Follow-up recommendations:  Activity:  no restrictions Diet:  regular  Is patient on multiple antipsychotic therapies at discharge:  No   Has Patient had three or more failed trials of antipsychotic monotherapy by history:  No  Recommended Plan for Multiple Antipsychotic Therapies: NA    Dallas Scorsone MD 08/11/2014, 9:44 AM

## 2014-08-11 NOTE — Progress Notes (Signed)
D: [name] was reserved and irritable upon approach. She took medications readily and denied SI/HI/AVH.   A: Meds given as ordered. Safety maintained with q15 checks.   R: Pt remains free from harm. She is preparing for discharge. Will continue to monitor for needs/safety until then.

## 2014-08-11 NOTE — Progress Notes (Signed)
Pt was discharged to lobby after receiving AVS, sample scripts, and prescriptions. AVS and prescriptions were reviewed. Pt verbalized feeling safe for discharged. She received and signed for receiving all belongings.

## 2014-08-14 NOTE — Progress Notes (Signed)
Patient Discharge Instructions:  After Visit Summary (AVS):   Faxed to:  08/14/14 Discharge Summary Note:   Faxed to:  08/14/14 Psychiatric Admission Assessment Note:   Faxed to:  08/14/14 Suicide Risk Assessment - Discharge Assessment:   Faxed to:  08/14/14 Faxed/Sent to the Next Level Care provider:  08/14/14 [facility] to [facility] @ [phone]  Jerelene ReddenSheena E Coldwater, 08/14/2014, 3:42 PM

## 2014-08-27 ENCOUNTER — Telehealth (HOSPITAL_BASED_OUTPATIENT_CLINIC_OR_DEPARTMENT_OTHER): Payer: Self-pay | Admitting: Emergency Medicine

## 2014-08-27 NOTE — Telephone Encounter (Signed)
Post ED Visit - Positive Culture Follow-up: Successful Patient Follow-Up   Positive Chlamydia culture  [x]  Patient discharged without antimicrobial prescription and treatment is now indicated []  Organism is resistant to prescribed ED discharge antimicrobial []  Patient with positive blood cultures  Changes discussed with ED provider: Effie ShyWentz New antibiotic prescription Zithromax one gram PO x once  08/27/14: left message for patient call office #   Jiles HaroldGammons, Damain Broadus Chaney 08/27/2014, 11:46 AM

## 2014-08-29 ENCOUNTER — Telehealth (HOSPITAL_BASED_OUTPATIENT_CLINIC_OR_DEPARTMENT_OTHER): Payer: Self-pay | Admitting: *Deleted

## 2014-09-01 ENCOUNTER — Telehealth (HOSPITAL_COMMUNITY): Payer: Self-pay

## 2014-09-01 NOTE — ED Notes (Signed)
Positive for chlamydia. Needs further treatment. Unable to reach pt.

## 2014-09-08 ENCOUNTER — Telehealth (HOSPITAL_COMMUNITY): Payer: Self-pay

## 2014-09-08 NOTE — ED Notes (Signed)
Unable to contact pt by mail or telephone. Unable to communicate lab results or treatment changes. 

## 2014-09-13 ENCOUNTER — Emergency Department (HOSPITAL_COMMUNITY): Payer: Medicaid - Out of State

## 2014-09-13 ENCOUNTER — Emergency Department (HOSPITAL_COMMUNITY)
Admission: EM | Admit: 2014-09-13 | Discharge: 2014-09-13 | Disposition: A | Discharge status: Home / Self Care | Payer: Medicaid - Out of State | Attending: Emergency Medicine | Admitting: Emergency Medicine

## 2014-09-13 ENCOUNTER — Encounter (HOSPITAL_COMMUNITY): Payer: Self-pay | Admitting: Emergency Medicine

## 2014-09-13 DIAGNOSIS — N739 Female pelvic inflammatory disease, unspecified: Secondary | ICD-10-CM

## 2014-09-13 DIAGNOSIS — R079 Chest pain, unspecified: Secondary | ICD-10-CM | POA: Diagnosis present

## 2014-09-13 DIAGNOSIS — Z720 Tobacco use: Secondary | ICD-10-CM | POA: Insufficient documentation

## 2014-09-13 DIAGNOSIS — R05 Cough: Secondary | ICD-10-CM

## 2014-09-13 DIAGNOSIS — Z79899 Other long term (current) drug therapy: Secondary | ICD-10-CM | POA: Diagnosis not present

## 2014-09-13 DIAGNOSIS — F329 Major depressive disorder, single episode, unspecified: Secondary | ICD-10-CM | POA: Diagnosis not present

## 2014-09-13 DIAGNOSIS — F419 Anxiety disorder, unspecified: Secondary | ICD-10-CM | POA: Insufficient documentation

## 2014-09-13 DIAGNOSIS — Z3202 Encounter for pregnancy test, result negative: Secondary | ICD-10-CM | POA: Diagnosis not present

## 2014-09-13 DIAGNOSIS — K5900 Constipation, unspecified: Secondary | ICD-10-CM | POA: Insufficient documentation

## 2014-09-13 DIAGNOSIS — Z91040 Latex allergy status: Secondary | ICD-10-CM | POA: Insufficient documentation

## 2014-09-13 DIAGNOSIS — J029 Acute pharyngitis, unspecified: Secondary | ICD-10-CM | POA: Diagnosis not present

## 2014-09-13 DIAGNOSIS — N760 Acute vaginitis: Secondary | ICD-10-CM | POA: Insufficient documentation

## 2014-09-13 DIAGNOSIS — R059 Cough, unspecified: Secondary | ICD-10-CM

## 2014-09-13 DIAGNOSIS — B3731 Acute candidiasis of vulva and vagina: Secondary | ICD-10-CM

## 2014-09-13 DIAGNOSIS — B373 Candidiasis of vulva and vagina: Secondary | ICD-10-CM

## 2014-09-13 LAB — RAPID STREP SCREEN (MED CTR MEBANE ONLY): Streptococcus, Group A Screen (Direct): NEGATIVE

## 2014-09-13 LAB — BASIC METABOLIC PANEL
ANION GAP: 8 (ref 5–15)
BUN: 11 mg/dL (ref 6–23)
CHLORIDE: 105 mmol/L (ref 96–112)
CO2: 25 mmol/L (ref 19–32)
CREATININE: 0.7 mg/dL (ref 0.50–1.10)
Calcium: 9.5 mg/dL (ref 8.4–10.5)
GFR calc Af Amer: >90 mL/min (ref >90)
GFR calc non Af Amer: >90 mL/min (ref >90)
Glucose, Bld: 84 mg/dL (ref 70–99)
POTASSIUM: 4.1 mmol/L (ref 3.5–5.1)
SODIUM: 138 mmol/L (ref 135–145)

## 2014-09-13 LAB — WET PREP, GENITAL
Clue Cells Wet Prep HPF POC: NONE SEEN
TRICH WET PREP: NONE SEEN

## 2014-09-13 LAB — CBC
HCT: 39.1 % (ref 36.0–46.0)
HEMOGLOBIN: 12.5 g/dL (ref 12.0–15.0)
MCH: 27.7 pg (ref 26.0–34.0)
MCHC: 32 g/dL (ref 30.0–36.0)
MCV: 86.5 fL (ref 78.0–100.0)
PLATELETS: 421 10*3/uL — AB (ref 150–400)
RBC: 4.52 MIL/uL (ref 3.87–5.11)
RDW: 13.9 % (ref 11.5–15.5)
WBC: 7.1 10*3/uL (ref 4.0–10.5)

## 2014-09-13 LAB — POC URINE PREG, ED: Preg Test, Ur: NEGATIVE

## 2014-09-13 LAB — I-STAT TROPONIN, ED: TROPONIN I, POC: 0 ng/mL (ref 0.00–0.08)

## 2014-09-13 MED ORDER — AZITHROMYCIN 250 MG PO TABS
1000.0000 mg | ORAL_TABLET | Freq: Once | ORAL | Status: AC
  Administered 2014-09-13: 1000 mg via ORAL
  Filled 2014-09-13: qty 4

## 2014-09-13 MED ORDER — DOXYCYCLINE HYCLATE 100 MG PO CAPS: 100.0000 mg | ORAL_CAPSULE | Freq: Two times a day (BID) | ORAL | Status: DC

## 2014-09-13 MED ORDER — FLUCONAZOLE 150 MG PO TABS: 150.0000 mg | ORAL_TABLET | Freq: Once | ORAL | Status: DC

## 2014-09-13 MED ORDER — CEFTRIAXONE SODIUM 250 MG IJ SOLR
250.0000 mg | Freq: Once | INTRAMUSCULAR | Status: AC
  Administered 2014-09-13: 250 mg via INTRAMUSCULAR
  Filled 2014-09-13: qty 250

## 2014-09-13 NOTE — ED Provider Notes (Signed)
CSN: [id number]     Arrival date & time 09/13/14  1538 History   First MD Initiated Contact with Patient 09/13/14 1704     Chief Complaint  Patient presents with  . Vaginal Bleeding  . Chest Pain  . Neck Pain     (Consider location/radiation/quality/duration/timing/severity/associated sxs/prior Treatment) HPI Comments: 24 year old female with a past medical history of mental disorder, ADHD, anxiety, personality disorder, depression and substance abuse presenting with multiple complaints. First, she is complaining of sore throat beginning yesterday evening, causing the right side of her neck to hurt. No aggravating or alleviating factors. She is also complaining of midsternal chest pressure, worse with coughing, states she has been coughing for the past month, productive with black phlegm. Lastly, patient is complaining of intermittent vaginal bleeding which has been ongoing "for a while". Patient is a poor historian and unable to tell me how long this has been going on for. States she was evaluated about a month ago at [facility], and states they told her that nothing was wrong and sent her home. She does not have an OB/GYN. States she has Implanon for birth control and does not get a menstrual period. Admits to associated white, creamy vaginal discharge. States she has been monogamous with one female sexual partner.  Patient is a 24 y.o. female presenting with vaginal bleeding, chest pain, and neck pain. The history is provided by the patient.  Vaginal Bleeding Associated symptoms: vaginal discharge   Chest Pain Associated symptoms: cough   Neck Pain Associated symptoms: chest pain     Past Medical History  Diagnosis Date  . Constipation   . Anxiety   . Mental disorder   . ADHD (attention deficit hyperactivity disorder)   . Personality disorder   . Depression   . Anxiety disorder   . Substance abuse    Past Surgical History  Procedure Laterality Date  . Cesarean section    .  Hernia repair    . Flexible sigmoidoscopy N/A 07/06/2014    Procedure: FLEXIBLE SIGMOIDOSCOPY;  Surgeon: Florencia Reasonsobert Buccini V, MD;  [facility];  Service: Endoscopy;  Laterality: N/A;   Family History  Problem Relation Age of Onset  . Alcohol abuse Neg Hx   . Arthritis Neg Hx   . Asthma Neg Hx   . Birth defects Neg Hx   . Cancer Neg Hx   . COPD Neg Hx   . Depression Neg Hx   . Diabetes Neg Hx   . Drug abuse Neg Hx   . Early death Neg Hx   . Hearing loss Neg Hx   . Heart disease Neg Hx   . Hyperlipidemia Neg Hx   . Hypertension Neg Hx   . Kidney disease Neg Hx   . Learning disabilities Neg Hx   . Mental illness Neg Hx   . Mental retardation Neg Hx   . Miscarriages / Stillbirths Neg Hx   . Stroke Neg Hx   . Vision loss Neg Hx   . Varicose Veins Neg Hx    History  Substance Use Topics  . Smoking status: Current Every Day Smoker  . Smokeless tobacco: Not on file  . Alcohol Use: Yes   OB History    Gravida Para Term Preterm AB TAB SAB Ectopic Multiple Living   3 3  3      3      Review of Systems  HENT: Positive for sore throat.   Respiratory: Positive for cough.   Cardiovascular: Positive  for chest pain.  Genitourinary: Positive for vaginal bleeding and vaginal discharge.  Musculoskeletal: Positive for neck pain.  All other systems reviewed and are negative.     Allergies  Latex  Home Medications   Prior to Admission medications   Medication Sig Start Date End Date Taking? Authorizing Provider  etonogestrel (NEXPLANON) 68 MG IMPL implant 1 each (68 mg total) by Subdermal route once. 02/2013 08/11/14  Yes Fransisca Kaufmann, NP  ferrous sulfate 325 (65 FE) MG tablet Take 1 tablet (325 mg total) by mouth 2 (two) times daily with a meal. 08/11/14  Yes Fransisca Kaufmann, NP  hydrOXYzine (ATARAX/VISTARIL) 50 MG tablet Take 1 tablet (50 mg total) by mouth 3 (three) times daily as needed for anxiety. 08/11/14  Yes Fransisca Kaufmann, NP  lithium carbonate 300 MG capsule Take 1 capsule  (300 mg total) by mouth daily with breakfast. 08/12/14  Yes Fransisca Kaufmann, NP  polyethylene glycol powder (GLYCOLAX) powder Take one capful (17 g) in 4 ounces (1/2 cup) of liquid each morning. 08/11/14  Yes Fransisca Kaufmann, NP  QUEtiapine (SEROQUEL) 100 MG tablet Take 1 tablet (100 mg total) by mouth at bedtime. 08/11/14  Yes Fransisca Kaufmann, NP  traZODone (DESYREL) 50 MG tablet Take 1 tablet (50 mg total) by mouth at bedtime as needed for sleep. 08/11/14  Yes Fransisca Kaufmann, NP  Docusate Sodium (DSS) 100 MG CAPS Take 100 mg by mouth 2 (two) times daily. Patient not taking: Reported on 09/13/2014 08/11/14   Fransisca Kaufmann, NP  doxycycline (VIBRAMYCIN) 100 MG capsule Take 1 capsule (100 mg total) by mouth 2 (two) times daily. One po bid x 7 days 09/13/14   Kathrynn Speed, PA-C  fluconazole (DIFLUCAN) 150 MG tablet Take 1 tablet (150 mg total) by mouth once. 09/13/14   Kathrynn Speed, PA-C  lithium carbonate 600 MG capsule Take 1 capsule (600 mg total) by mouth daily with supper. Patient not taking: Reported on 09/13/2014 08/11/14   Fransisca Kaufmann, NP  pantoprazole (PROTONIX) 40 MG tablet Take 1 tablet (40 mg total) by mouth daily. Patient not taking: Reported on 09/13/2014 08/11/14   Fransisca Kaufmann, NP   BP 124/75 mmHg  Pulse 56  Temp(Src) 98.5 F (36.9 C) (Oral)  Resp 18  SpO2 100% Physical Exam  Constitutional: She is oriented to person, place, and time. She appears well-developed and well-nourished. No distress.  HENT:  Head: Normocephalic and atraumatic.  Mouth/Throat: Oropharynx is clear and moist. No posterior oropharyngeal edema, posterior oropharyngeal erythema or tonsillar abscesses.  Eyes: Conjunctivae and EOM are normal.  Neck: Normal range of motion. Neck supple. No spinous process tenderness and no muscular tenderness present.  Cardiovascular: Normal rate, regular rhythm and normal heart sounds.   Pulmonary/Chest: Effort normal and breath sounds normal. No respiratory distress.  Abdominal: Soft. Bowel sounds are  normal. She exhibits no distension. There is no rebound and no guarding.  Mild suprapubic tenderness. No peritoneal signs.  Genitourinary: Uterus normal. Cervix exhibits motion tenderness and discharge. Cervix exhibits no friability. Right adnexum displays no mass, no tenderness and no fullness. Left adnexum displays no mass, no tenderness and no fullness. Vaginal discharge (thick, yellow) found.  Musculoskeletal: Normal range of motion. She exhibits no edema.  Lymphadenopathy:    She has no cervical adenopathy.  Neurological: She is alert and oriented to person, place, and time. No sensory deficit.  Skin: Skin is warm and dry.  Psychiatric: She has a normal mood and affect. Her behavior is normal.  Nursing  note and vitals reviewed.   ED Course  Procedures (including critical care time) Labs Review Labs Reviewed  WET PREP, GENITAL - Abnormal; Notable for the following:    Yeast Wet Prep HPF POC MODERATE (*)    WBC, Wet Prep HPF POC TOO NUMEROUS TO COUNT (*)    All other components within normal limits  CBC - Abnormal; Notable for the following:    Platelets 421 (*)    All other components within normal limits  RAPID STREP SCREEN  CULTURE, GROUP A STREP  BASIC METABOLIC PANEL  I-STAT TROPOININ, ED  POC URINE PREG, ED  GC/CHLAMYDIA PROBE AMP ([place])    Imaging Review Dg Chest 2 View  09/13/2014   CLINICAL DATA:  RUQ sharp c.p. radiating superiorly to R side of neck x 1 day. States she has had vaginal bleeding as well as bleeding through the anus. States the color originally was dark and clotting but now is bright red. States she also had a productive cough of black sputum. Nondiabetic. NonHTN. Smoker of 1 ppd for 14 years.  EXAM: CHEST  2 VIEW  COMPARISON:  None.  FINDINGS: Normal heart, mediastinum and hila. Lungs are clear. No pleural effusion or pneumothorax.  Bony thorax is unremarkable.  IMPRESSION: Normal chest radiographs.   Electronically Signed   By: Amie Portland M.D.    On: 09/13/2014 17:55     EKG Interpretation   Date/Time:  Saturday September 13 2014 16:07:53 EST Ventricular Rate:  74 PR Interval:  79 QRS Duration: 73 QT Interval:  417 QTC Calculation: 463 R Axis:   90 Text Interpretation:  Sinus rhythm Multiform ventricular premature  complexes Short PR interval Borderline right axis deviation Baseline  wander in lead(s) II aVF V3 V4 V5 No significant change since last tracing  Confirmed by Ethelda Chick  MD, SAM [phone]) on 09/13/2014 4:18:30 PM      MDM   Final diagnoses:  PID (pelvic inflammatory disease)  Yeast vaginitis  Cough  Sore throat   Nontoxic appearing and in no apparent distress. Vital signs stable. Afebrile. Abdomen soft with no peritoneal signs. Mild suprapubic tenderness. She does have significant vaginal discharge and cervical motion tenderness. Positive Chlamydia 1 month ago that she did not get treated for. Most likely pelvic inflammatory disease. I am Rocephin and oral azithromycin given in the ED, will discharge patient home on doxycycline for 2 weeks. Wet prep also positive for yeast, will treat with Diflucan. Doubt TOA. Advised follow-up with women's outpatient clinic. Regarding cough and sore throat, rapid strep negative, chest x-ray clear. Lungs clear. Discussed symptomatic treatment. Stable for discharge. Return precautions given. Patient states understanding of treatment care plan and is agreeable.   Kathrynn Speed, PA-C 09/13/14 1904  Doug Sou, MD 09/13/14 2116

## 2014-09-13 NOTE — ED Notes (Signed)
Pt from home c/o chest pressure, intermittent vaginal bleeding, and neck pain since last pm.

## 2014-09-13 NOTE — Tx Team (Signed)
Initial Interdisciplinary Treatment Plan   PATIENT STRESSORS: Financial difficulties Medication change or noncompliance   PATIENT STRENGTHS: Capable of independent living Motivation for treatment/growth   PROBLEM LIST: Problem List/Patient Goals Date to be addressed Date deferred Reason deferred Estimated date of resolution                                                         DISCHARGE CRITERIA:  Motivation to continue treatment in a less acute level of care Need for constant or close observation no longer present  PRELIMINARY DISCHARGE PLAN: Outpatient therapy Return to previous living arrangement  PATIENT/FAMIILY INVOLVEMENT: This treatment plan has been presented to and reviewed with the patient, Olivia RoesStanlesha Santos.  The patient and family have been given the opportunity to ask questions and make suggestions.  Cranford MonBeaudry, [place] Evans 09/13/2014, 6:29 PM

## 2014-09-13 NOTE — Discharge Instructions (Signed)
You were treated today for both gonorrhea and chlamydia. If these tests result positive, you will be contacted and are then obligated to inform your partner for treatment. Take doxycycline twice daily for 2 weeks. It is very important for you to complete this course of antibiotics. Take Diflucan once as directed.  Pelvic Inflammatory Disease Pelvic inflammatory disease (PID) is an infection in some or all of the female organs. PID can be in the uterus, ovaries, fallopian tubes, or the surrounding tissues inside the lower belly area (pelvis). HOME CARE   If given, take your antibiotic medicine as told. Finish them even if you start to feel better.  Only take medicine as told by your doctor.  Do not have sex (intercourse) until treatment is done or as told by your doctor.  Tell your sex partner if you have PID. Your partner may need to be treated.  Keep all doctor visits. GET HELP RIGHT AWAY IF:   You have a fever.  You have more belly (abdominal) or lower belly pain.  You have chills.  You have pain when you pee (urinate).  You are not better after 72 hours.  You have more fluid (discharge) coming from your vagina or fluid that is not normal.  You need pain medicine from your doctor.  You throw up (vomit).  You cannot take your medicines.  Your partner has a sexually transmitted disease (STD). MAKE SURE YOU:   Understand these instructions.  Will watch your condition.  Will get help right away if you are not doing well or get worse. Document Released: 10/07/2008 Document Revised: 11/05/2012 Document Reviewed: 07/07/2011 [facility] Patient Information 2015 [place], [place]. This information is not intended to replace advice given to you by your health care provider. Make sure you discuss any questions you have with your health care provider.  Sore Throat A sore throat is a painful, burning, sore, or scratchy feeling of the throat. There may be pain or tenderness when  swallowing or talking. You may have other symptoms with a sore throat. These include coughing, sneezing, fever, or a swollen neck. A sore throat is often the first sign of another sickness. These sicknesses may include a cold, flu, strep throat, or an infection called mono. Most sore throats go away without medical treatment.  HOME CARE   Only take medicine as told by your doctor.  Drink enough fluids to keep your pee (urine) clear or pale yellow.  Rest as needed.  Try using throat sprays, lozenges, or suck on hard candy (if older than 4 years or as told).  Sip warm liquids, such as broth, herbal tea, or warm water with honey. Try sucking on frozen ice pops or drinking cold liquids.  Rinse the mouth (gargle) with salt water. Mix 1 teaspoon salt with 8 ounces of water.  Do not smoke. Avoid being around others when they are smoking.  Put a humidifier in your bedroom at night to moisten the air. You can also turn on a hot shower and sit in the bathroom for 5-10 minutes. Be sure the bathroom door is closed. GET HELP RIGHT AWAY IF:   You have trouble breathing.  You cannot swallow fluids, soft foods, or your spit (saliva).  You have more puffiness (swelling) in the throat.  Your sore throat does not get better in 7 days.  You feel sick to your stomach (nauseous) and throw up (vomit).  You have a fever or lasting symptoms for more than 2-3 days.  You  have a fever and your symptoms suddenly get worse. MAKE SURE YOU:   Understand these instructions.  Will watch your condition.  Will get help right away if you are not doing well or get worse. Document Released: 04/19/2008 Document Revised: 04/04/2012 Document Reviewed: 03/18/2012 [facility] Patient Information 2015 [place], [place]. This information is not intended to replace advice given to you by your health care provider. Make sure you discuss any questions you have with your health care provider.  Sexually Transmitted Disease A  sexually transmitted disease (STD) is a disease or infection often passed to another person during sex. However, STDs can be passed through nonsexual ways. An STD can be passed through:  Spit (saliva).  Semen.  Blood.  Mucus from the vagina.  Pee (urine). HOW CAN I LESSEN MY CHANCES OF GETTING AN STD?  Use:  Latex condoms.  Water-soluble lubricants with condoms. Do not use petroleum jelly or oils.  Dental dams. These are small pieces of latex that are used as a barrier during oral sex.  Avoid having more than one sex partner.  Do not have sex with someone who has other sex partners.  Do not have sex with anyone you do not know or who is at high risk for an STD.  Avoid risky sex that can break your skin.  Do not have sex if you have open sores on your mouth or skin.  Avoid drinking too much alcohol or taking illegal drugs. Alcohol and drugs can affect your good judgment.  Avoid oral and anal sex acts.  Get shots (vaccines) for HPV and hepatitis.  If you are at risk of being infected with HIV, it is advised that you take a certain medicine daily to prevent HIV infection. This is called pre-exposure prophylaxis (PrEP). You may be at risk if:  You are a man who has sex with other men (MSM).  You are attracted to the opposite sex (heterosexual) and are having sex with more than one partner.  You take drugs with a needle.  You have sex with someone who has HIV.  Talk with your doctor about if you are at high risk of being infected with HIV. If you begin to take PrEP, get tested for HIV first. Get tested every 3 months for as long as you are taking PrEP. WHAT SHOULD I DO IF I THINK I HAVE AN STD?  See your doctor.  Tell your sex partner(s) that you have an STD. They should be tested and treated.  Do not have sex until your doctor says it is okay. WHEN SHOULD I GET HELP? Get help right away if:  You have bad belly (abdominal) pain.  You are a man and have puffiness  (swelling) or pain in your testicles.  You are a woman and have puffiness in your vagina. Document Released: 08/18/2004 Document Revised: 07/16/2013 Document Reviewed: 01/04/2013 [facility] Patient Information 2015 [place], [place]. This information is not intended to replace advice given to you by your health care provider. Make sure you discuss any questions you have with your health care provider.  Cough, Adult  A cough is a reflex. It helps you clear your throat and airways. A cough can help heal your body. A cough can last 2 or 3 weeks (acute) or may last more than 8 weeks (chronic). Some common causes of a cough can include an infection, allergy, or a cold. HOME CARE  Only take medicine as told by your doctor.  If given, take your medicines (  antibiotics) as told. Finish them even if you start to feel better.  Use a cold steam vaporizer or humidifier in your home. This can help loosen thick spit (secretions).  Sleep so you are almost sitting up (semi-upright). Use pillows to do this. This helps reduce coughing.  Rest as needed.  Stop smoking if you smoke. GET HELP RIGHT AWAY IF:  You have yellowish-white fluid (pus) in your thick spit.  Your cough gets worse.  Your medicine does not reduce coughing, and you are losing sleep.  You cough up blood.  You have trouble breathing.  Your pain gets worse and medicine does not help.  You have a fever. MAKE SURE YOU:   Understand these instructions.  Will watch your condition.  Will get help right away if you are not doing well or get worse. Document Released: 03/24/2011 Document Revised: 11/25/2013 Document Reviewed: 03/24/2011 [facility] Patient Information 2015 [place], [place]. This information is not intended to replace advice given to you by your health care provider. Make sure you discuss any questions you have with your health care provider.

## 2014-09-15 LAB — GC/CHLAMYDIA PROBE AMP (~~LOC~~) NOT AT ARMC
Chlamydia: NEGATIVE
Neisseria Gonorrhea: NEGATIVE

## 2014-09-18 LAB — CULTURE, GROUP A STREP

## 2015-06-15 ENCOUNTER — Inpatient Hospital Stay (HOSPITAL_COMMUNITY)
Admission: AD | Admit: 2015-06-15 | Discharge: 2015-06-22 | DRG: 885 | Disposition: A | Discharge status: Home / Self Care | Payer: Federal, State, Local not specified - Other | Source: Intra-hospital | Attending: Psychiatry | Admitting: Psychiatry

## 2015-06-15 ENCOUNTER — Encounter (HOSPITAL_COMMUNITY): Payer: Self-pay

## 2015-06-15 ENCOUNTER — Encounter (HOSPITAL_COMMUNITY): Payer: Self-pay | Admitting: Emergency Medicine

## 2015-06-15 ENCOUNTER — Emergency Department (HOSPITAL_COMMUNITY)
Admission: EM | Admit: 2015-06-15 | Discharge: 2015-06-15 | Disposition: A | Discharge status: Other Acute Inpatient Hospital | Payer: Self-pay | Attending: Emergency Medicine | Admitting: Emergency Medicine

## 2015-06-15 DIAGNOSIS — Z91040 Latex allergy status: Secondary | ICD-10-CM | POA: Insufficient documentation

## 2015-06-15 DIAGNOSIS — F1410 Cocaine abuse, uncomplicated: Secondary | ICD-10-CM | POA: Diagnosis present

## 2015-06-15 DIAGNOSIS — R45851 Suicidal ideations: Secondary | ICD-10-CM

## 2015-06-15 DIAGNOSIS — F1020 Alcohol dependence, uncomplicated: Secondary | ICD-10-CM | POA: Diagnosis not present

## 2015-06-15 DIAGNOSIS — F4310 Post-traumatic stress disorder, unspecified: Secondary | ICD-10-CM | POA: Diagnosis present

## 2015-06-15 DIAGNOSIS — S8012XA Contusion of left lower leg, initial encounter: Secondary | ICD-10-CM | POA: Insufficient documentation

## 2015-06-15 DIAGNOSIS — Z79899 Other long term (current) drug therapy: Secondary | ICD-10-CM

## 2015-06-15 DIAGNOSIS — Z91410 Personal history of adult physical and sexual abuse: Secondary | ICD-10-CM | POA: Diagnosis not present

## 2015-06-15 DIAGNOSIS — S40021A Contusion of right upper arm, initial encounter: Secondary | ICD-10-CM | POA: Insufficient documentation

## 2015-06-15 DIAGNOSIS — Z8719 Personal history of other diseases of the digestive system: Secondary | ICD-10-CM | POA: Insufficient documentation

## 2015-06-15 DIAGNOSIS — Y9389 Activity, other specified: Secondary | ICD-10-CM | POA: Insufficient documentation

## 2015-06-15 DIAGNOSIS — F329 Major depressive disorder, single episode, unspecified: Secondary | ICD-10-CM | POA: Insufficient documentation

## 2015-06-15 DIAGNOSIS — S8011XA Contusion of right lower leg, initial encounter: Secondary | ICD-10-CM | POA: Insufficient documentation

## 2015-06-15 DIAGNOSIS — S300XXA Contusion of lower back and pelvis, initial encounter: Secondary | ICD-10-CM | POA: Insufficient documentation

## 2015-06-15 DIAGNOSIS — F1420 Cocaine dependence, uncomplicated: Secondary | ICD-10-CM | POA: Diagnosis not present

## 2015-06-15 DIAGNOSIS — F17200 Nicotine dependence, unspecified, uncomplicated: Secondary | ICD-10-CM | POA: Diagnosis present

## 2015-06-15 DIAGNOSIS — R61 Generalized hyperhidrosis: Secondary | ICD-10-CM

## 2015-06-15 DIAGNOSIS — R634 Abnormal weight loss: Secondary | ICD-10-CM

## 2015-06-15 DIAGNOSIS — Y999 Unspecified external cause status: Secondary | ICD-10-CM | POA: Insufficient documentation

## 2015-06-15 DIAGNOSIS — Y9259 Other trade areas as the place of occurrence of the external cause: Secondary | ICD-10-CM | POA: Insufficient documentation

## 2015-06-15 DIAGNOSIS — F250 Schizoaffective disorder, bipolar type: Principal | ICD-10-CM | POA: Diagnosis present

## 2015-06-15 DIAGNOSIS — F1310 Sedative, hypnotic or anxiolytic abuse, uncomplicated: Secondary | ICD-10-CM | POA: Insufficient documentation

## 2015-06-15 DIAGNOSIS — S40022A Contusion of left upper arm, initial encounter: Secondary | ICD-10-CM | POA: Insufficient documentation

## 2015-06-15 DIAGNOSIS — F1210 Cannabis abuse, uncomplicated: Secondary | ICD-10-CM | POA: Diagnosis not present

## 2015-06-15 DIAGNOSIS — F32A Depression, unspecified: Secondary | ICD-10-CM

## 2015-06-15 DIAGNOSIS — S199XXA Unspecified injury of neck, initial encounter: Secondary | ICD-10-CM | POA: Insufficient documentation

## 2015-06-15 DIAGNOSIS — E221 Hyperprolactinemia: Secondary | ICD-10-CM | POA: Diagnosis present

## 2015-06-15 DIAGNOSIS — T148 Other injury of unspecified body region: Secondary | ICD-10-CM | POA: Insufficient documentation

## 2015-06-15 LAB — URINALYSIS, ROUTINE W REFLEX MICROSCOPIC
Bilirubin Urine: NEGATIVE
GLUCOSE, UA: NEGATIVE mg/dL
Ketones, ur: NEGATIVE mg/dL
LEUKOCYTES UA: NEGATIVE
NITRITE: NEGATIVE
PH: 5.5 (ref 5.0–8.0)
Protein, ur: NEGATIVE mg/dL
SPECIFIC GRAVITY, URINE: 1.03 (ref 1.005–1.030)

## 2015-06-15 LAB — CBC
HEMATOCRIT: 42 % (ref 36.0–46.0)
HEMOGLOBIN: 14 g/dL (ref 12.0–15.0)
MCH: 28.9 pg (ref 26.0–34.0)
MCHC: 33.3 g/dL (ref 30.0–36.0)
MCV: 86.6 fL (ref 78.0–100.0)
Platelets: 383 10*3/uL (ref 150–400)
RBC: 4.85 MIL/uL (ref 3.87–5.11)
RDW: 14.4 % (ref 11.5–15.5)
WBC: 6.9 10*3/uL (ref 4.0–10.5)

## 2015-06-15 LAB — I-STAT BETA HCG BLOOD, ED (MC, WL, AP ONLY): I-stat hCG, quantitative: <5 m[IU]/mL (ref <5)

## 2015-06-15 LAB — RAPID URINE DRUG SCREEN, HOSP PERFORMED
AMPHETAMINES: NOT DETECTED
BENZODIAZEPINES: POSITIVE — AB
Barbiturates: NOT DETECTED
COCAINE: POSITIVE — AB
Opiates: NOT DETECTED
Tetrahydrocannabinol: NOT DETECTED

## 2015-06-15 LAB — COMPREHENSIVE METABOLIC PANEL
ALT: 18 U/L (ref 14–54)
ANION GAP: 9 (ref 5–15)
AST: 31 U/L (ref 15–41)
Albumin: 4.4 g/dL (ref 3.5–5.0)
Alkaline Phosphatase: 139 U/L — ABNORMAL HIGH (ref 38–126)
BILIRUBIN TOTAL: 0.3 mg/dL (ref 0.3–1.2)
BUN: 11 mg/dL (ref 6–20)
CO2: 26 mmol/L (ref 22–32)
Calcium: 9.3 mg/dL (ref 8.9–10.3)
Chloride: 103 mmol/L (ref 101–111)
Creatinine, Ser: 0.91 mg/dL (ref 0.44–1.00)
GFR calc Af Amer: >60 mL/min (ref >60)
Glucose, Bld: 94 mg/dL (ref 65–99)
POTASSIUM: 3.5 mmol/L (ref 3.5–5.1)
Sodium: 138 mmol/L (ref 135–145)
TOTAL PROTEIN: 7.7 g/dL (ref 6.5–8.1)

## 2015-06-15 LAB — URINE MICROSCOPIC-ADD ON

## 2015-06-15 LAB — SALICYLATE LEVEL

## 2015-06-15 LAB — HIV ANTIBODY (ROUTINE TESTING W REFLEX): HIV SCREEN 4TH GENERATION: NONREACTIVE

## 2015-06-15 LAB — ETHANOL

## 2015-06-15 LAB — RPR: RPR: NONREACTIVE

## 2015-06-15 LAB — ACETAMINOPHEN LEVEL

## 2015-06-15 MED ORDER — TRAZODONE HCL 100 MG PO TABS: 100.0000 mg | ORAL_TABLET | Freq: Every evening | ORAL | Status: DC | PRN

## 2015-06-15 MED ORDER — MAGNESIUM HYDROXIDE 400 MG/5ML PO SUSP
30.0000 mL | Freq: Every day | ORAL | Status: DC | PRN
  Administered 2015-06-20: 30 mL via ORAL
  Filled 2015-06-15: qty 30

## 2015-06-15 MED ORDER — OLANZAPINE 5 MG PO TABS
5.0000 mg | ORAL_TABLET | Freq: Every day | ORAL | Status: DC
  Administered 2015-06-15 – 2015-06-16 (×2): 5 mg via ORAL
  Filled 2015-06-15 (×2): qty 1
  Filled 2015-06-15: qty 2
  Filled 2015-06-15 (×2): qty 1

## 2015-06-15 MED ORDER — IBUPROFEN 400 MG PO TABS: 600.0000 mg | ORAL_TABLET | Freq: Three times a day (TID) | ORAL | Status: DC | PRN

## 2015-06-15 MED ORDER — NICOTINE 21 MG/24HR TD PT24
21.0000 mg | MEDICATED_PATCH | Freq: Every day | TRANSDERMAL | Status: DC
  Filled 2015-06-15 (×11): qty 1

## 2015-06-15 MED ORDER — METRONIDAZOLE 500 MG PO TABS
2000.0000 mg | ORAL_TABLET | Freq: Once | ORAL | Status: AC
  Administered 2015-06-15: 2000 mg via ORAL
  Filled 2015-06-15: qty 4

## 2015-06-15 MED ORDER — DIVALPROEX SODIUM 250 MG PO DR TAB
250.0000 mg | DELAYED_RELEASE_TABLET | Freq: Two times a day (BID) | ORAL | Status: DC
  Administered 2015-06-15 – 2015-06-20 (×11): 250 mg via ORAL
  Filled 2015-06-15 (×16): qty 1

## 2015-06-15 MED ORDER — CEFTRIAXONE SODIUM 250 MG IJ SOLR
250.0000 mg | Freq: Once | INTRAMUSCULAR | Status: AC
  Administered 2015-06-15: 250 mg via INTRAMUSCULAR
  Filled 2015-06-15: qty 250

## 2015-06-15 MED ORDER — ONDANSETRON HCL 4 MG PO TABS: 4.0000 mg | ORAL_TABLET | Freq: Three times a day (TID) | ORAL | Status: DC | PRN

## 2015-06-15 MED ORDER — LORAZEPAM 1 MG PO TABS: 1.0000 mg | ORAL_TABLET | Freq: Three times a day (TID) | ORAL | Status: DC | PRN

## 2015-06-15 MED ORDER — OLANZAPINE 10 MG IM SOLR: 5.0000 mg | Freq: Three times a day (TID) | INTRAMUSCULAR | Status: DC | PRN

## 2015-06-15 MED ORDER — AZITHROMYCIN 250 MG PO TABS
1000.0000 mg | ORAL_TABLET | Freq: Once | ORAL | Status: AC
  Administered 2015-06-15: 1000 mg via ORAL
  Filled 2015-06-15: qty 4

## 2015-06-15 MED ORDER — CHLORDIAZEPOXIDE HCL 25 MG PO CAPS: 25.0000 mg | ORAL_CAPSULE | ORAL | Status: DC | PRN

## 2015-06-15 MED ORDER — ENSURE ENLIVE PO LIQD
237.0000 mL | Freq: Three times a day (TID) | ORAL | Status: DC
  Administered 2015-06-15 – 2015-06-21 (×17): 237 mL via ORAL

## 2015-06-15 MED ORDER — HYDROXYZINE HCL 25 MG PO TABS
25.0000 mg | ORAL_TABLET | Freq: Four times a day (QID) | ORAL | Status: DC | PRN
  Administered 2015-06-18 – 2015-06-20 (×2): 25 mg via ORAL
  Filled 2015-06-15 (×3): qty 1

## 2015-06-15 MED ORDER — OLANZAPINE 5 MG PO TABS
5.0000 mg | ORAL_TABLET | Freq: Three times a day (TID) | ORAL | Status: DC | PRN
  Administered 2015-06-15: 5 mg via ORAL

## 2015-06-15 MED ORDER — NICOTINE 21 MG/24HR TD PT24: 21.0000 mg | MEDICATED_PATCH | Freq: Every day | TRANSDERMAL | Status: DC

## 2015-06-15 MED ORDER — ACETAMINOPHEN 325 MG PO TABS
650.0000 mg | ORAL_TABLET | Freq: Four times a day (QID) | ORAL | Status: DC | PRN
Start: 2015-06-15 — End: 2015-06-22
  Administered 2015-06-19: 650 mg via ORAL
  Filled 2015-06-15: qty 2

## 2015-06-15 MED ORDER — ALUM & MAG HYDROXIDE-SIMETH 200-200-20 MG/5ML PO SUSP: 30.0000 mL | ORAL | Status: DC | PRN

## 2015-06-15 NOTE — ED Notes (Signed)
[facility] here to transport pt.  
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
## 2015-06-15 NOTE — ED Notes (Signed)
Pt has returned to the room with a [employer] escort.

## 2015-06-15 NOTE — ED Notes (Signed)
The RN walked into the room to introduce herself and assess the pt. THe writer introduced herself and informed the pt that she needed to ask her a few questions. The writer logged into the computer and the pt gathered her belongings and walked out. The writer attempted to get the pt to come back into the room and the pt stated "i'm good I don't need yall help."   Charge RN and MD notified.   [employer] and security attempting to locate the pt.
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
## 2015-06-15 NOTE — [facility] Suicide Risk Assessment (Signed)
[facility]   Nursing information obtained from:    Demographic factors:    Current Mental Status:    Loss Factors:    Historical Factors:    Risk Reduction Factors:    Total Time spent with patient: 30 minutes Principal Problem: Schizoaffective disorder, bipolar type (HCC) Diagnosis:   Patient Active Problem List   Diagnosis Date Noted  . Psychosis [F29] 06/15/2015  . Schizoaffective disorder, bipolar type (HCC) [F25.0] 06/15/2015  . Cocaine use disorder, severe, dependence (HCC) [F14.20] 06/15/2015  . Alcohol use disorder, moderate, dependence (HCC) [F10.20] 06/15/2015  . Generalized abdominal pain [R10.84]   . Bipolar I disorder, most recent episode depressed (HCC) [F31.30] 08/07/2014  . Bacterial vaginosis [N76.0, A49.9] 08/07/2014  . Cocaine use disorder, moderate, dependence (HCC) [F14.20] 08/07/2014  . Cannabis use disorder, severe, dependence (HCC) [F12.20] 08/07/2014  . Rectal ulceration [K62.6] 07/08/2014  . Constipation [K59.00] 07/08/2014  . GI bleed [K92.2] 07/05/2014  . Acute blood loss anemia [D62] 07/05/2014  . Rectal bleeding [K62.5] 07/05/2014  . Rectal bleed [K62.5] 07/05/2014  . Depression, major, severe recurrence (HCC) [F33.2] 12/09/2013  . Affective bipolar disorder (HCC) [F31.9] 12/03/2013  . Abnormal finding on antenatal screen [O28.9] 09/26/2013  . Insufficient prenatal care [O09.30] 09/26/2013  . HGSIL (high grade squamous intraepithelial lesion) on Pap smear of cervix [R87.613] 05/27/2013  . Anemia in pregnancy [O99.019] 05/21/2013  . Chronic post-traumatic stress disorder [F43.12] 05/14/2013  . Cocaine abuse [F14.10] 08/29/2012  . Abnormal findings on antenatal screening [O28.9] 05/17/2012  . H/O previous obstetrical problem [Z87.42] 05/17/2012  . Antepartum mental disorder in pregnancy [O99.340] 05/17/2012  . H/O cesarean section [Z98.891] 05/17/2012  . Anomaly of pregnant uterus [O34.00, Q51.9] 05/17/2012  . High-risk  pregnancy [O09.90] 05/10/2012     Continued Clinical Symptoms:    The "Alcohol Use Disorders Identification Test", Guidelines for Use in Primary Care, Second Edition.  World Science writerHealth Organization [facility](WHO). Score between 0-7:  no or low risk or alcohol related problems. Score between 8-15:  moderate risk of alcohol related problems. Score between 16-19:  high risk of alcohol related problems. Score 20 or above:  warrants further diagnostic evaluation for alcohol dependence and treatment.   CLINICAL FACTORS:   Alcohol/Substance Abuse/Dependencies Personality Disorders:   Cluster B Comorbid alcohol abuse/dependence Previous Psychiatric Diagnoses and Treatments   Musculoskeletal: Strength & Muscle Tone: within normal limits Gait & Station: normal Patient leans: N/A  Psychiatric Specialty Exam: Physical Exam  ROS  Blood pressure 128/78, pulse 69, temperature 98.3 F (36.8 C), temperature source Oral, resp. rate 16, SpO2 100 %.There is no weight on file to calculate BMI.                          Please see H&P.                                COGNITIVE FEATURES THAT CONTRIBUTE TO RISK:  Closed-mindedness, Polarized thinking and Thought constriction (tunnel vision)    SUICIDE RISK:   Moderate:  Frequent suicidal ideation with limited intensity, and duration, some specificity in terms of plans, no associated intent, good self-control, limited dysphoria/symptomatology, some risk factors present, and identifiable protective factors, including available and accessible social support.  PLAN OF CARE: Please see H&P.   Medical Decision Making:  New problem, with additional work up planned, Review of Psycho-Social Stressors (1), Review or order clinical lab  tests (1), Review and summation of old records (2), New Problem, with no additional work-up planned (3), Review of Last Therapy Session (1), Review or order medicine tests (1), Review of Medication Regimen & Side  Effects (2) and Review of New Medication or Change in Dosage (2)  I certify that inpatient services furnished can reasonably be expected to improve the patient's condition.   Penne Rosenstock MD 06/15/2015, 12:05 PM

## 2015-06-15 NOTE — ED Notes (Addendum)
Sitter at the bedside, IVC paperwork completed at copies at the desk.
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
## 2015-06-15 NOTE — ED Notes (Signed)
Called for sitter ?

## 2015-06-15 NOTE — ED Notes (Signed)
MD at bedside. 

## 2015-06-15 NOTE — ED Notes (Signed)
SANE at bedside
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
## 2015-06-15 NOTE — [facility] Group Notes (Signed)
[facility] Group Notes:  (Counselor/Nursing/MHT/Case Management/Adjunct)  06/15/2015 1:15PM  Type of Therapy:  Group Therapy  Participation Level:  Active  Participation Quality:  Appropriate  Affect:  Flat  Cognitive:  Oriented  Insight:  Improving  Engagement in Group:  Limited  Engagement in Therapy:  Limited  Modes of Intervention:  Discussion, Exploration and Socialization  Summary of Progress/Problems: The topic for group was balance in life.  Pt participated in the discussion about when their life was in balance and out of balance and how this feels.  Pt discussed ways to get back in balance and short term goals they can work on to get where they want to be.   Invited.  Chose to not attend   [name], [name] 06/15/2015 2:39 PM

## 2015-06-15 NOTE — ED Notes (Signed)
Called magistrates office about IVC paperwork, papers faxed again to day shift office.

## 2015-06-16 NOTE — Progress Notes (Signed)
Adult Psychoeducational Group Note  Date:  06/16/2015 Time:  8:35 PM  Group Topic/Focus:  Wrap-Up Group:   The focus of this group is to help patients review their daily goal of treatment and discuss progress on daily workbooks.  Participation Level:  Active  Participation Quality:  Appropriate  Affect:  Depressed  Cognitive:  Appropriate  Insight: Lacking  Engagement in Group:  Engaged  Modes of Intervention:  Discussion  Additional Comments:  Pt rated overall day a 1 out of 10 because "I don't know". Pt reported that she did not have a goal for the day. Pt reported that the highlight of her day was that she ate breakfast.   Cleotilde NeerJasmine S Josetta Wigal 06/16/2015, 11:16 PM

## 2015-06-16 NOTE — [facility] Group Notes (Signed)
[facility] LCSW Group Therapy  06/16/2015 1:15 pm  Type of Therapy: Process Group Therapy  Participation Level:  Active  Participation Quality:  Appropriate  Affect:  Flat  Cognitive:  Oriented  Insight:  Improving  Engagement in Group:  Limited  Engagement in Therapy:  Limited  Modes of Intervention:  Activity, Clarification, Education, Problem-solving and Support  Summary of Progress/Problems: Today's group addressed the issue of overcoming obstacles.  Patients were asked to identify their biggest obstacle post d/c that stands in the way of their on-going success, and then problem solve as to how to manage this. Invited.  Chose to not attend  [name], [name] 06/16/2015   4:27 PM

## 2015-06-16 NOTE — Progress Notes (Addendum)
D: Pt remained in her room asleep for the majority of the shift. Pt took her scheduled medications and consumed a snack upon briefly waking up. Pt denied any SI/HI/AVH. Pt did not appear to be responding to internal stimuli. Pt had no questions or concerns she wished for this writer to address at this time.  A: Writer administered scheduled medications to pt, per MD orders. Continued support and availability as needed was extended to this pt. Staff continues to monitor pt with q7115min checks.  R: No adverse drug reactions noted. Pt receptive to treatment. Pt remains safe at this time.
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
## 2015-06-16 NOTE — Clinical Social Work Note (Signed)
CSW attempted to meet with pt this morning in order to complete Psychosocial assessment and discuss aftercare plan. Pt refused to get out of bed to meet. CSW will try again after lunch.  Trula SladeHeather Smart, MSW, LCSW Clinical Social Worker 06/16/2015 1:04 PM

## 2015-06-16 NOTE — Progress Notes (Signed)
D) Pt has been isolative to room, seclusive to self. Negative for any unit activities. Pt does attend meals with prompting. On assessment this a.m. Pt denied avh or s.i. Pt stated that she thought "maybe the medication" is making her sleepy. A) Level 3 obs for safety, support and encouragement provided. Prompting. Med ed reinforced. R) Guarded.

## 2015-06-16 NOTE — Clinical Social Work Note (Signed)
2nd attempt to meet with pt this afternoon. Pt extremely lethargic, refusing to get out of bed. Closed eyes when asked if she would please get up to speak with CSW.   Trula SladeHeather Smart, MSW, LCSW Clinical Social Worker 06/16/2015 2:06 PM

## 2015-06-17 DIAGNOSIS — E221 Hyperprolactinemia: Secondary | ICD-10-CM | POA: Clinically undetermined

## 2015-06-17 LAB — HEPATITIS PANEL, ACUTE
HEP A IGM: NEGATIVE
HEP B S AG: NEGATIVE
Hep B C IgM: NEGATIVE

## 2015-06-17 LAB — URINE CULTURE: Special Requests: NORMAL

## 2015-06-17 LAB — PROLACTIN: PROLACTIN: 33.2 ng/mL — AB (ref 4.8–23.3)

## 2015-06-17 LAB — GC/CHLAMYDIA PROBE AMP (~~LOC~~) NOT AT ARMC
Chlamydia: NEGATIVE
Neisseria Gonorrhea: NEGATIVE

## 2015-06-17 MED ORDER — PRAZOSIN HCL 1 MG PO CAPS
1.0000 mg | ORAL_CAPSULE | Freq: Every day | ORAL | Status: DC
  Administered 2015-06-17 – 2015-06-21 (×5): 1 mg via ORAL
  Filled 2015-06-17: qty 1
  Filled 2015-06-17: qty 7
  Filled 2015-06-17 (×5): qty 1

## 2015-06-17 MED ORDER — ARIPIPRAZOLE 5 MG PO TABS
5.0000 mg | ORAL_TABLET | Freq: Every day | ORAL | Status: DC
  Administered 2015-06-17 – 2015-06-19 (×3): 5 mg via ORAL
  Filled 2015-06-17 (×6): qty 1

## 2015-06-17 MED ORDER — OLANZAPINE 10 MG PO TABS
10.0000 mg | ORAL_TABLET | Freq: Every day | ORAL | Status: DC
  Filled 2015-06-17 (×2): qty 1

## 2015-06-17 MED ORDER — BENZTROPINE MESYLATE 0.5 MG PO TABS
0.5000 mg | ORAL_TABLET | Freq: Every day | ORAL | Status: DC
  Administered 2015-06-17 – 2015-06-21 (×5): 0.5 mg via ORAL
  Filled 2015-06-17 (×4): qty 1
  Filled 2015-06-17: qty 7
  Filled 2015-06-17 (×3): qty 1

## 2015-06-17 MED ORDER — OLANZAPINE 5 MG PO TABS
5.0000 mg | ORAL_TABLET | Freq: Every day | ORAL | Status: AC
Start: 2015-06-17 — End: 2015-06-18
  Administered 2015-06-17 – 2015-06-18 (×2): 5 mg via ORAL
  Filled 2015-06-17 (×2): qty 1

## 2015-06-17 MED ORDER — CITALOPRAM HYDROBROMIDE 10 MG PO TABS
10.0000 mg | ORAL_TABLET | Freq: Every day | ORAL | Status: DC
  Administered 2015-06-17 – 2015-06-22 (×6): 10 mg via ORAL
  Filled 2015-06-17 (×5): qty 1
  Filled 2015-06-17: qty 7
  Filled 2015-06-17 (×2): qty 1

## 2015-06-17 NOTE — Clinical Social Work Note (Signed)
Pt continues to remain in bed. Lethargic/remaining in bed with eyes closed when asked to meet with CSW to complete PSA. CSW will attempt again this afternoon.  Trula SladeHeather Smart, MSW, LCSW Clinical Social Worker 06/17/2015 10:53 AM
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
## 2015-06-17 NOTE — Progress Notes (Signed)
D: Pt has depressed affect and mood.  She forwards very little information to writer.  When asked about her day, pt states she "just rested."  Pt denies SI/HI, denies hallucinations, denies pain, denies withdrawal symptoms.  Pt interacts with peers and staff cautiously.  Pt attended evening group.   A: Introduced self to pt.  Met with pt 1:1 and provided support and encouragement.  Actively listened to pt.  PO fluids encouraged and provided.  Medications administered per order.   R: Pt is compliant with medications.  Pt verbally contracts for safety.  Will continue to monitor and assess.   
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
## 2015-06-17 NOTE — [facility] Group Notes (Signed)
[facility]  06/17/2015 , 1:22 PM    Type of Therapy:  [employer] Presentation  Participation Level:  Active  Participation Quality:  Attentive  Affect:  Blunted  Cognitive:  Oriented  Insight:  Limited  Engagement in Therapy:  Engaged  Modes of Intervention:  Discussion, Education and Socialization  Summary of Progress/Problems:  Onalee HuaDavid from [employer] came to present his recovery story and play the guitar.  Attended her first group!  Stayed three quarters of the time.  Left and did not return.  Daryel Geraldorth, Anberlin Diez B 06/17/2015 , 1:22 PM

## 2015-06-17 NOTE — Plan of Care (Signed)
Problem: Diagnosis: Increased Risk For Suicide Attempt Goal: STG-Patient Will Attend All Groups On The Unit Outcome: Progressing Pt attended evening group on 06/16/15.     

## 2015-06-17 NOTE — Progress Notes (Signed)
D-  Patient denies SI and HI patient denies pain.  Patient reported positive auditory command auditory hallucinations.  Patient reports hallucinations that are indistinguishable whispers.  Patient is also positive for visual hallucinations of moving shadows.  Patient states that the hallucinations as often bothersome and scary to her and that she is only free of them when she is sleeping.    A- Assess patient for safety, offer medications as prescribed, engage patient in 1:1 therapeutic talks, encourage patient to attend groups and comply with treatment.  R- Patient was able to remain free of harm to self/others.  Patient verbally contracted for safety.  Patient was compliant with treatment, but remained isolative to room.
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
## 2015-06-18 MED ORDER — TRAZODONE HCL 150 MG PO TABS: 150.0000 mg | ORAL_TABLET | Freq: Every evening | ORAL | Status: DC | PRN

## 2015-06-18 MED ORDER — FLUTICASONE PROPIONATE 50 MCG/ACT NA SUSP
1.0000 | Freq: Every day | NASAL | Status: DC
  Administered 2015-06-18 – 2015-06-22 (×5): 1 via NASAL
  Filled 2015-06-18 (×2): qty 16

## 2015-06-18 MED ORDER — LORATADINE 10 MG PO TABS
10.0000 mg | ORAL_TABLET | Freq: Every day | ORAL | Status: DC
  Administered 2015-06-18 – 2015-06-22 (×5): 10 mg via ORAL
  Filled 2015-06-18 (×5): qty 1
  Filled 2015-06-18: qty 7
  Filled 2015-06-18 (×3): qty 1
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
## 2015-06-18 NOTE — Progress Notes (Signed)
D:  Patient's self inventory sheet, patient has fair sleep.  Poor appetite, low energy level, poor concentration.  Rated depression, hopeless and anxiety 5.  Denied withdrawals.  Denied SI.  Denied physical problems.  Goal is to go to groups and participate.  No discharge plans. A:  Medications administered per MD orders.  Emotional support and encouragement given patient.    R:  Denied SI and HI, contracts for safety.  Denied A/V hallucinations.  Safety maintained with 15 minute checks.

## 2015-06-18 NOTE — [facility] Group Notes (Signed)
The focus of this group is to educate the patient on the purpose and policies of crisis stabilization and provide a format to answer questions about their admission.  The group details unit policies and expectations of patients while admitted.  Patient did not attend 0900 nurse education orientation group this morning.  Patient stayed in bed sleeping.    

## 2015-06-18 NOTE — Plan of Care (Signed)
Problem: Alteration in mood & ability to function due to Goal: LTG-Pt reports reduction in suicidal thoughts (Patient reports reduction in suicidal thoughts and is able to verbalize a safety plan for whenever patient is feeling suicidal)  Outcome: Progressing Pt denied SI at this time

## 2015-06-18 NOTE — Progress Notes (Signed)
D: Pt denies SI/HI/AVH. Pt is pleasant and cooperative. Pt stated she was doing better because she feels the meds are working.   A: Pt was offered support and encouragement. Pt was given scheduled medications. Pt was encourage to attend groups. Q 15 minute checks were done for safety.   R:Pt attends groups and interacts well with peers and staff. Pt is taking medication. Pt has no complaints at this time .Pt receptive to treatment and safety maintained on unit.

## 2015-06-18 NOTE — Plan of Care (Signed)
Problem: Consults Goal: Depression Patient Education See Patient Education Module for education specifics.  Outcome: Progressing Nurse discussed depression/coping skills with patient.        

## 2015-06-19 LAB — VALPROIC ACID LEVEL: Valproic Acid Lvl: 40 ug/mL — ABNORMAL LOW (ref 50.0–100.0)

## 2015-06-19 MED ORDER — IBUPROFEN 600 MG PO TABS
600.0000 mg | ORAL_TABLET | Freq: Four times a day (QID) | ORAL | Status: DC | PRN
  Administered 2015-06-19 (×2): 600 mg via ORAL
  Filled 2015-06-19 (×2): qty 1

## 2015-06-19 MED ORDER — TRAZODONE HCL 150 MG PO TABS
150.0000 mg | ORAL_TABLET | Freq: Every day | ORAL | Status: DC
  Administered 2015-06-19 – 2015-06-21 (×3): 150 mg via ORAL
  Filled 2015-06-19: qty 1
  Filled 2015-06-19: qty 7
  Filled 2015-06-19 (×3): qty 1

## 2015-06-19 NOTE — Plan of Care (Signed)
Problem: Diagnosis: Increased Risk For Suicide Attempt Goal: LTG-Patient Will Report Improved Mood and Deny Suicidal LTG (by discharge) Patient will report improved mood and deny suicidal ideation.  Outcome: Progressing Pt denies any SI/HI at this time.

## 2015-06-19 NOTE — Progress Notes (Signed)
D: Affect/mood. Denies SI/HI/AVH.  C/o of pain left middle toe.  Small amount swelling noted on assessment. Patient offered ice pack and refused.  Instructed to elevate foot as much as possible. A: Scheduled medication administered to patient, per MD orders.  Support and encouragement provided. Routine safety checks conducted every 15 minutes.  Patient informed to notify staff with problems or concerns. R: No adverse drug reactions noted.  Patient contracts for safety at this time. Patient compliant with medications and treatment plan.  Patient receptive, calm, and cooperative. Patient interacts well with others on the unit.  Patient remains safe at this time.

## 2015-06-19 NOTE — [facility] Group Notes (Signed)
[facility] LCSW Aftercare Discharge Planning Group Note   06/19/2015 9:46 AM  Participation Quality:  Patient invited but chose to remain in room    [name], [name]   

## 2015-06-19 NOTE — Progress Notes (Signed)
Patient ID: Ninfa MeekerStanlesha XXXSimmons, female   DOB: 08/23/1990, 24 y.o.   MRN: [id number] [facility]  06/19/2015 1:00 PM Ninfa MeekerStanlesha XXXSimmons  MRN:  [id number] Subjective: Patient states  "My left middle toe hurts. I bumped it on the wall last night. I'm still not sleeping at night. No one offered me any Trazodone. My mood is all right now. When am I going home?  Objective:  Patient seen today and chart reviewed. Discussed patient with treatment team.  Pt continues to be seen as withdrawn, internally preoccupied, avoids eye contact, barely participates in group milieu.  However nursing staff reports that patient continues to have AH of whispers and VH of shadows. Pt also reports that she is really distressed by these hallucinations that it scares her. Pt reports that the only thing that helps her with her AH is sleeping. Hence she spends most of her day withdrawn to self and sleeping. She complained of pain to middle left toe. Says she bumped it on the wall last night. Assessment reveals no obvious swellings to left middle toe. Will initiate Ibuprofen 600 mg prn & changeTrazodone 150 mg from Prn to routine.   Principal Problem: Schizoaffective disorder, bipolar type (HCC)  Diagnosis:   Patient Active Problem List   Diagnosis Date Noted  . Hyperprolactinemia (HCC) [E22.1] 06/17/2015  . Psychosis [F29] 06/15/2015  . Schizoaffective disorder, bipolar type (HCC) [F25.0] 06/15/2015  . Cocaine use disorder, severe, dependence (HCC) [F14.20] 06/15/2015  . Alcohol use disorder, moderate, dependence (HCC) [F10.20] 06/15/2015  . PTSD (post-traumatic stress disorder) [F43.10] 06/15/2015  . Cannabis abuse [F12.10] 06/15/2015  . Rectal ulceration [K62.6] 07/08/2014  . Rectal bleeding [K62.5] 07/05/2014  . Rectal bleed [K62.5] 07/05/2014   Total Time spent with patient: 30 minutes  Past Psychiatric History: Pt reports hx of schizoaffective Disorder, depression, PTSD as well as polysubstance abuse  and cluster B personality traits. Pt [place]. Pt reports several suicide attempts in the past.Pt also has self mutilating behavior.  Past Medical History:  Past Medical History  Diagnosis Date  . Constipation   . Anxiety   . Mental disorder   . ADHD (attention deficit hyperactivity disorder)   . Personality disorder   . Depression   . Anxiety disorder   . Substance abuse     Past Surgical History  Procedure Laterality Date  . Cesarean section    . Hernia repair    . Flexible sigmoidoscopy N/A 07/06/2014    Procedure: FLEXIBLE SIGMOIDOSCOPY;  Surgeon: Florencia Reasonsobert Buccini V, MD;  [facility];  Service: Endoscopy;  Laterality: N/A;   Family History:  Family History  Problem Relation Age of Onset  . Alcohol abuse Neg Hx   . Arthritis Neg Hx   . Asthma Neg Hx   . Birth defects Neg Hx   . Cancer Neg Hx   . COPD Neg Hx   . Depression Neg Hx   . Drug abuse Neg Hx   . Early death Neg Hx   . Hearing loss Neg Hx   . Heart disease Neg Hx   . Hyperlipidemia Neg Hx   . Kidney disease Neg Hx   . Learning disabilities Neg Hx   . Mental illness Neg Hx   . Mental retardation Neg Hx   . Miscarriages / Stillbirths Neg Hx   . Stroke Neg Hx   . Vision loss Neg Hx   . Varicose Veins Neg  Hx   . Schizophrenia Mother   . Alcoholism Brother   . Hypertension Maternal Grandfather   . Diabetes Maternal Grandfather    Family Psychiatric  History:  Pt reports that her mother has a hx of schizophrenia, her brother is an alcoholic . Denies suicide in family.  Social History: Pt went up to college. Pt is on SSD for mental illness. Pt is single , has three children , 1y,2y,3y old , her mother is the legal guardian of her children. Pt denies pending legal charges. History       History  Alcohol Use  . 1.2 oz/week  . 2 Cans of beer per week    Comment: UTA     History  Drug Use  . Yes  . Special: Cocaine,  "Crack" cocaine    Social History   Social History  . Marital Status: Single    Spouse Name: N/A  . Number of Children: N/A  . Years of Education: N/A   Social History Main Topics  . Smoking status: Current Every Day Smoker  . Smokeless tobacco: None  . Alcohol Use: 1.2 oz/week    2 Cans of beer per week     Comment: UTA  . Drug Use: Yes    Special: Cocaine, "Crack" cocaine  . Sexual Activity: Not Asked   Other Topics Concern  . None   Social History Narrative   Additional Social History:    Pain Medications: see MAR Prescriptions: see MAR Over the Counter: see MAR History of alcohol / drug use?: Yes Negative Consequences of Use: Personal relationships, Financial Withdrawal Symptoms: Other (Comment) (none)  Sleep: Fair  Appetite:  Fair  Current Medications: Current Facility-Administered Medications  Medication Dose Route Frequency Provider Last Rate Last Dose  . acetaminophen (TYLENOL) tablet 650 mg  650 mg Oral Q6H PRN Sanjuana Kava, NP   650 mg at 06/19/15 1610  . alum & mag hydroxide-simeth (MAALOX/MYLANTA) 200-200-20 MG/5ML suspension 30 mL  30 mL Oral Q4H PRN Sanjuana Kava, NP      . ARIPiprazole (ABILIFY) tablet 5 mg  5 mg Oral QHS Jomarie Longs, MD   5 mg at 06/18/15 2128  . benztropine (COGENTIN) tablet 0.5 mg  0.5 mg Oral QHS Jomarie Longs, MD   0.5 mg at 06/18/15 2128  . chlordiazePOXIDE (LIBRIUM) capsule 25 mg  25 mg Oral Q4H PRN Jomarie Longs, MD      . citalopram (CELEXA) tablet 10 mg  10 mg Oral Daily Saramma Eappen, MD   10 mg at 06/19/15 1001  . divalproex (DEPAKOTE) DR tablet 250 mg  250 mg Oral Q12H Saramma Eappen, MD   250 mg at 06/19/15 1002  . feeding supplement (ENSURE ENLIVE) (ENSURE ENLIVE) liquid 237 mL  237 mL Oral TID BM Saramma Eappen, MD   237 mL at 06/19/15 1001  . fluticasone (FLONASE) 50 MCG/ACT nasal spray 1 spray  1 spray Each Nare Daily Sanjuana Kava, NP   1 spray at 06/19/15 1002  . hydrOXYzine (ATARAX/VISTARIL) tablet 25 mg  25  mg Oral Q6H PRN Jomarie Longs, MD   25 mg at 06/18/15 1304  . ibuprofen (ADVIL,MOTRIN) tablet 600 mg  600 mg Oral Q6H PRN Sanjuana Kava, NP      . loratadine (CLARITIN) tablet 10 mg  10 mg Oral Daily Sanjuana Kava, NP   10 mg at 06/19/15 1002  . magnesium hydroxide (MILK OF MAGNESIA) suspension 30 mL  30 mL Oral Daily PRN Nicole Kindred  I Nwoko, NP      . nicotine (NICODERM CQ - dosed in mg/24 hours) patch 21 mg  21 mg Transdermal Q0600 Sanjuana Kava, NP   21 mg at 06/15/15 1304  . OLANZapine (ZYPREXA) tablet 5 mg  5 mg Oral Q8H PRN Jomarie Longs, MD   5 mg at 06/15/15 1300   Or  . OLANZapine (ZYPREXA) injection 5 mg  5 mg Intramuscular Q8H PRN Saramma Eappen, MD      . prazosin (MINIPRESS) capsule 1 mg  1 mg Oral QHS Jomarie Longs, MD   1 mg at 06/18/15 2127  . traZODone (DESYREL) tablet 150 mg  150 mg Oral QHS Sanjuana Kava, NP        Lab Results:  Results for orders placed or performed during the hospital encounter of 06/15/15 (from the past 48 hour(s))  Valproic acid level     Status: Abnormal   Collection Time: 06/19/15  6:11 AM  Result Value Ref Range   Valproic Acid Lvl 40 (L) 50.0 - 100.0 ug/mL    Comment: Performed at [facility]    Physical Findings: AIMS: Facial and Oral Movements Muscles of Facial Expression: None, normal Lips and Perioral Area: None, normal Jaw: None, normal Tongue: None, normal,Extremity Movements Upper (arms, wrists, hands, fingers): None, normal Lower (legs, knees, ankles, toes): None, normal, Trunk Movements Neck, shoulders, hips: None, normal, Overall Severity Severity of abnormal movements (highest score from questions above): None, normal Incapacitation due to abnormal movements: None, normal Patient's awareness of abnormal movements (rate only patient's report): No Awareness, Dental Status Current problems with teeth and/or dentures?: No Does patient usually wear dentures?: No  CIWA:  CIWA-Ar Total: 1 COWS:  COWS Total Score:  1  Musculoskeletal: Strength & Muscle Tone: within normal limits Gait & Station: normal Patient leans: N/A  Psychiatric Specialty Exam: Review of Systems  Psychiatric/Behavioral: Positive for depression and substance abuse. The patient is nervous/anxious.   All other systems reviewed and are negative.   Blood pressure 131/65, pulse 110, temperature 98.2 F (36.8 C), temperature source Oral, resp. rate 18, SpO2 98 %.There is no weight on file to calculate BMI.  General Appearance: Disheveled  Eye Solicitor::  None  Speech:  Slow  Volume:  Decreased  Mood:  Depressed and Dysphoric  Affect:  Depressed  Thought Process:  Linear  Orientation:  Full (Time, Place, and Person)  Thought Content:  Hallucinations: Auditory Visual, Paranoid Ideation and Rumination AH that scares her  Suicidal Thoughts:  No  Homicidal Thoughts:  No  Memory:  Immediate;   Fair Recent;   Fair Remote;   Fair  Judgement:  Impaired  Insight:  Shallow  Psychomotor Activity:  Decreased  Concentration:  Poor  Recall:  [employer] of Knowledge:Fair  Language: Fair  Akathisia:  No  Handed:  Right  AIMS (if indicated):     Assets:  Physical Health  ADL's:  Intact  Cognition: WNL  Sleep:  Number of Hours: 6.75   Treatment Plan Summary: Patient continues to be depressed , isolative and internally preoccupied. Minimal interaction in milieu.Continue treatment. Daily contact with patient to assess and evaluate symptoms and progress in treatment and Medication management :Will continue Depakote DR 250 mg po bid for mood lability- depakote level on 06/19/15, result pending.  Patient reports that she wants to be discharged on a LAI - Hence discussed Abilify - she is agreeable . Will cross taper Zyprexa with Abilify. Abilify to be increased over  the next few days depending on how she tolerates it. Will taper down Zyprexa.  Will provide Abilify Maintena 400 mg IM q 30 days - prior to discharge , if she tolerates Abilify  well.  Changed Trazodone 150 mg po qhs from prn to routine for sleep due to complaints of insomnia. Vistaril 25 mg po q6h prn for anxiety sx. Continue celexa 10 mg po daily for PTSD sx. Continue Prazosin 1 mg po qhs for nightmares. Will continue CIWA/Librium prn for alcohol withdrawal sx. Continue Zyprexa 5 mg po q8h prn for anxiety/agitation, severe. Will continue to monitor vitals ,medication compliance and treatment side effects while patient is here.  Will monitor for medical issues as well as call consult as needed.  Continue Claritin 10 mg for congestion & Flonase nasal congestions, Ibuprofen 600 mg prn for toe pain. - elevated prolactin level- pt to follow this on an out patient basis. CSW will start working on disposition.  Patient to participate in therapeutic milieu .   Armandina Stammer I, PMHNP, FNP-BC 06/19/2015, 1:00 PM I agree with findings and treatment plan of this patient

## 2015-06-19 NOTE — Progress Notes (Signed)
DAR NOTE: Patient presents with depressed mood and affect.  Denies auditory and visual hallucinations.  Rates depression at 0, hopelessness at 0, and anxiety at 5.  Maintained on routine safety checks.  Medications given as prescribed.  Support and encouragement offered as needed.   States goal for today is "to stay out of bed."  Patient in bed most of this shift.    Complain of left toe pain received Motrin 600 mg with good effect.

## 2015-06-20 MED ORDER — DIVALPROEX SODIUM 250 MG PO DR TAB
250.0000 mg | DELAYED_RELEASE_TABLET | Freq: Every day | ORAL | Status: DC
  Administered 2015-06-21 – 2015-06-22 (×2): 250 mg via ORAL
  Filled 2015-06-20 (×4): qty 1
  Filled 2015-06-20: qty 21

## 2015-06-20 MED ORDER — ARIPIPRAZOLE 15 MG PO TABS
7.5000 mg | ORAL_TABLET | Freq: Every day | ORAL | Status: DC
  Administered 2015-06-20 – 2015-06-21 (×2): 7.5 mg via ORAL
  Filled 2015-06-20: qty 4
  Filled 2015-06-20 (×4): qty 1

## 2015-06-20 MED ORDER — DIVALPROEX SODIUM 500 MG PO DR TAB
500.0000 mg | DELAYED_RELEASE_TABLET | Freq: Every day | ORAL | Status: DC
  Administered 2015-06-20 – 2015-06-21 (×2): 500 mg via ORAL
  Filled 2015-06-20 (×4): qty 1

## 2015-06-20 NOTE — Progress Notes (Signed)
D:Per patient self inventory form pt reports she slept poor last night with the use of sleep medication. She reports a fair appetite, normal energy level. Pt rates depression 0/10, hopelessness 0/10, anxiety 7/10- all on 0-10 scale, 10 being the worse. Pt denies SI/HI. Denies AVH. Pt presents with anxiety at times. No other s/s of withdrawal noted. Pt not attending group on the unit. Pt noted in bed throughout the shift/ Pt reports her goal for the day is "stay up."   A:Special checks q 15 mins in place for safety. Medication administered per MD order(See eMAR). Encouragement and support provided. Pt encouraged to attend groups. PRN medication given for c/o constipation. CIWA protocol in place.  R:Safety maintained. Compliant with medication regimen. Will continue to monitor.

## 2015-06-20 NOTE — Progress Notes (Signed)
[facility] MD Progress Note  06/20/2015  [name]  MRN:  [id number] Subjective: Patient states  "I'm doing pretty good today. The only thing is that I keep seeing more shadows at times. Also, my sleep was awful. Other than that, I'm doing good."  Objective:  Patient seen today and chart reviewed. Pt has been observed to be withdrawn at times and is improving slightly although still complaining of persisting shadows along with anxiety. Pt denies suicidal/homicidal ideation, denies auditory hallucinations, and does not appear to be responding to internal stimuli. However, her anxiety is high and this is evident in her presentation. She cites very poor sleep and moderate appetite and is participating in groups.   Principal Problem: Schizoaffective disorder, bipolar type (HCC)  Diagnosis:   Patient Active Problem List   Diagnosis Date Noted  . Hyperprolactinemia (HCC) [E22.1] 06/17/2015  . Psychosis [F29] 06/15/2015  . Schizoaffective disorder, bipolar type (HCC) [F25.0] 06/15/2015  . Cocaine use disorder, severe, dependence (HCC) [F14.20] 06/15/2015  . Alcohol use disorder, moderate, dependence (HCC) [F10.20] 06/15/2015  . PTSD (post-traumatic stress disorder) [F43.10] 06/15/2015  . Cannabis abuse [F12.10] 06/15/2015  . Rectal ulceration [K62.6] 07/08/2014  . Rectal bleeding [K62.5] 07/05/2014  . Rectal bleed [K62.5] 07/05/2014   Total Time spent with patient: 15 minutes  Past Psychiatric History: Pt reports hx of schizoaffective Disorder, depression, PTSD as well as polysubstance abuse and cluster B personality traits. Pt has had several hospitalizations - most recent one was in [place] , few months ago. Pt reports several suicide attempts in the past.Pt also has self mutilating behavior.  Past Medical History:  Past Medical History  Diagnosis Date  . Constipation   . Anxiety   . Mental disorder   . ADHD (attention deficit hyperactivity disorder)   . Personality disorder   .  Depression   . Anxiety disorder   . Substance abuse     Past Surgical History  Procedure Laterality Date  . Cesarean section    . Hernia repair    . Flexible sigmoidoscopy N/A 07/06/2014    Procedure: FLEXIBLE SIGMOIDOSCOPY;  Surgeon: Florencia Reasons, MD;  Location: [facility] ENDOSCOPY;  Service: Endoscopy;  Laterality: N/A;   Family History:  Family History  Problem Relation Age of Onset  . Alcohol abuse Neg Hx   . Arthritis Neg Hx   . Asthma Neg Hx   . Birth defects Neg Hx   . Cancer Neg Hx   . COPD Neg Hx   . Depression Neg Hx   . Drug abuse Neg Hx   . Early death Neg Hx   . Hearing loss Neg Hx   . Heart disease Neg Hx   . Hyperlipidemia Neg Hx   . Kidney disease Neg Hx   . Learning disabilities Neg Hx   . Mental illness Neg Hx   . Mental retardation Neg Hx   . Miscarriages / Stillbirths Neg Hx   . Stroke Neg Hx   . Vision loss Neg Hx   . Varicose Veins Neg Hx   . Schizophrenia Mother   . Alcoholism Brother   . Hypertension Maternal Grandfather   . Diabetes Maternal Grandfather    Family Psychiatric  History:  Pt reports that her mother has a hx of schizophrenia, her brother is an alcoholic . Denies suicide in family.  Social History: Pt went up to college. Pt is on SSD for mental illness. Pt is single , has three children , 6y,2y,3y old , her mother is the  legal guardian of her children. Pt denies pending legal charges. History       History  Alcohol Use  . 1.2 oz/week  . 2 Cans of beer per week    Comment: UTA     History  Drug Use  . Yes  . Special: Cocaine, "Crack" cocaine    Social History   Social History  . Marital Status: Single    Spouse Name: N/A  . Number of Children: N/A  . Years of Education: N/A   Social History Main Topics  . Smoking status: Current Every Day Smoker  . Smokeless tobacco: None  . Alcohol Use: 1.2 oz/week    2 Cans of beer per week     Comment: UTA  . Drug Use: Yes    Special: Cocaine, "Crack" cocaine  . Sexual  Activity: Not Asked   Other Topics Concern  . None   Social History Narrative   Additional Social History:    Pain Medications: see MAR Prescriptions: see MAR Over the Counter: see MAR History of alcohol / drug use?: Yes Negative Consequences of Use: Personal relationships, Financial Withdrawal Symptoms: Other (Comment) (none)  Sleep: Poor  Appetite:  Good  Current Medications: Current Facility-Administered Medications  Medication Dose Route Frequency Provider Last Rate Last Dose  . acetaminophen (TYLENOL) tablet 650 mg  650 mg Oral Q6H PRN Sanjuana Kava, NP   650 mg at 06/19/15 1610  . alum & mag hydroxide-simeth (MAALOX/MYLANTA) 200-200-20 MG/5ML suspension 30 mL  30 mL Oral Q4H PRN Sanjuana Kava, NP      . ARIPiprazole (ABILIFY) tablet 5 mg  5 mg Oral QHS Jomarie Longs, MD   5 mg at 06/19/15 2304  . benztropine (COGENTIN) tablet 0.5 mg  0.5 mg Oral QHS Jomarie Longs, MD   0.5 mg at 06/19/15 2305  . chlordiazePOXIDE (LIBRIUM) capsule 25 mg  25 mg Oral Q4H PRN Jomarie Longs, MD      . citalopram (CELEXA) tablet 10 mg  10 mg Oral Daily Jomarie Longs, MD   10 mg at 06/20/15 0824  . divalproex (DEPAKOTE) DR tablet 250 mg  250 mg Oral Q12H Jomarie Longs, MD   250 mg at 06/20/15 0824  . feeding supplement (ENSURE ENLIVE) (ENSURE ENLIVE) liquid 237 mL  237 mL Oral TID BM Saramma Eappen, MD   237 mL at 06/20/15 1343  . fluticasone (FLONASE) 50 MCG/ACT nasal spray 1 spray  1 spray Each Nare Daily Sanjuana Kava, NP   1 spray at 06/20/15 0825  . hydrOXYzine (ATARAX/VISTARIL) tablet 25 mg  25 mg Oral Q6H PRN Jomarie Longs, MD   25 mg at 06/20/15 0330  . ibuprofen (ADVIL,MOTRIN) tablet 600 mg  600 mg Oral Q6H PRN Sanjuana Kava, NP   600 mg at 06/19/15 2309  . loratadine (CLARITIN) tablet 10 mg  10 mg Oral Daily Sanjuana Kava, NP   10 mg at 06/20/15 0824  . magnesium hydroxide (MILK OF MAGNESIA) suspension 30 mL  30 mL Oral Daily PRN Sanjuana Kava, NP   30 mL at 06/20/15 0915  .  nicotine (NICODERM CQ - dosed in mg/24 hours) patch 21 mg  21 mg Transdermal Q0600 Sanjuana Kava, NP   21 mg at 06/15/15 1304  . OLANZapine (ZYPREXA) tablet 5 mg  5 mg Oral Q8H PRN Jomarie Longs, MD   5 mg at 06/15/15 1300   Or  . OLANZapine (ZYPREXA) injection 5 mg  5 mg Intramuscular Q8H PRN  Jomarie LongsSaramma Eappen, MD      . prazosin (MINIPRESS) capsule 1 mg  1 mg Oral QHS Jomarie LongsSaramma Eappen, MD   1 mg at 06/19/15 2305  . traZODone (DESYREL) tablet 150 mg  150 mg Oral QHS Sanjuana KavaAgnes I Nwoko, NP   150 mg at 06/19/15 2304    Lab Results:  Results for orders placed or performed during the hospital encounter of 06/15/15 (from the past 48 hour(s))  Valproic acid level     Status: Abnormal   Collection Time: 06/19/15  6:11 AM  Result Value Ref Range   Valproic Acid Lvl 40 (L) 50.0 - 100.0 ug/mL    Comment: [facility]    Physical Findings: AIMS: Facial and Oral Movements Muscles of Facial Expression: None, normal Lips and Perioral Area: None, normal Jaw: None, normal Tongue: None, normal,Extremity Movements Upper (arms, wrists, hands, fingers): None, normal Lower (legs, knees, ankles, toes): None, normal, Trunk Movements Neck, shoulders, hips: None, normal, Overall Severity Severity of abnormal movements (highest score from questions above): None, normal Incapacitation due to abnormal movements: None, normal Patient's awareness of abnormal movements (rate only patient's report): No Awareness, Dental Status Current problems with teeth and/or dentures?: No Does patient usually wear dentures?: No  CIWA:  CIWA-Ar Total: 0 COWS:  COWS Total Score: 1  Musculoskeletal: Strength & Muscle Tone: within normal limits Gait & Station: normal Patient leans: N/A  Psychiatric Specialty Exam: Review of Systems  Psychiatric/Behavioral: Positive for depression and substance abuse. The patient is nervous/anxious.   All other systems reviewed and are negative.   Blood pressure  108/50, pulse 72, temperature 98.4 F (36.9 C), temperature source Oral, resp. rate 18, SpO2 98 %.There is no weight on file to calculate BMI.  General Appearance: Casual and Disheveled  Eye Contact::  None  Speech:  Clear and Coherent and Normal Rate  Volume:  Decreased  Mood:  Anxious, Depressed and Dysphoric  Affect:  Depressed and Labile  Thought Process:  Goal Directed  Orientation:  Full (Time, Place, and Person)  Thought Content:  Hallucinations: Auditory Visual, Paranoid Ideation and Rumination with auditory improving and visual presenting as a great concern in the form of shadows.   Suicidal Thoughts:  No  Homicidal Thoughts:  No  Memory:  Immediate;   Fair Recent;   Fair Remote;   Fair  Judgement:  Impaired  Insight:  Shallow  Psychomotor Activity:  Decreased  Concentration:  Poor  Recall:  [employer]  Fund of Knowledge:Fair  Language: Fair  Akathisia:  No  Handed:  Right  AIMS (if indicated):     Assets:  Physical Health  ADL's:  Intact  Cognition: WNL  Sleep:  Number of Hours: 5.75   Treatment Plan Summary: Patient continues to be depressed , isolative and internally preoccupied. Minimal interaction in milieu.Continue treatment.  Daily contact with patient to assess and evaluate symptoms and progress in treatment and Medication management:  depakote level on 06/19/15, resulted at 40 this AM, Prolactin is 33.2  Patient reports that she wants to be discharged on a Long-acting-anti-psychotics - Hence discussed Abilify - she is agreeable. Abilify to be increased over the next few days depending on how she tolerates it. Will taper down Zyprexa.  Will provide Abilify Maintena 400 mg IM q 30 days - prior to discharge , if she tolerates Abilify well.  -Increase Depakote to 250mg  AM and 500mg  qhs for mood stabilization (was 250 bid, Depakote level low at 40 as above and pt  still very anxious) -Continue Trazodone 150 mg po qhs from prn to routine for sleep due to complaints of  insomnia. -Vistaril 25 mg po q6h prn for anxiety sx. -Increase Abilify to 7.5mg  qhs for psychosis (was 5mg ) -Continue celexa 10 mg po daily for PTSD sx. -Continue Prazosin 1 mg po qhs for nightmares. -Will continue CIWA/Librium prn for alcohol withdrawal sx. -Continue Zyprexa 5 mg po q8h prn for anxiety/agitation, severe. -Will continue to monitor vitals ,medication compliance and treatment side effects while patient is here.  -Will monitor for medical issues as well as call consult as needed.  -Continue Claritin 10 mg for congestion & Flonase nasal congestions, -Ibuprofen 600 mg prn for toe pain. -elevated prolactin level- pt to follow this on an out patient basis. -CSW will start working on disposition.  -Patient to participate in therapeutic milieu.   Beau Fanny, FNP-BC 06/20/2015, 1:15 PM I agree with assessment and plan Madie Reno A. Dub Mikes, M.D.

## 2015-06-20 NOTE — [facility] Group Notes (Signed)
[facility] Group Notes: (Clinical Social Work)   06/20/2015      Type of Therapy:  Group Therapy   Participation Level:  Did Not Attend despite MHT prompting - she did come into the room at the very end, but refused to speak other than to give her name   Ambrose MantleMareida Grossman-Orr, LCSW 06/20/2015, 1:03 PM

## 2015-06-20 NOTE — Progress Notes (Addendum)
D: Observed visiting with boyfriend during visiting hours.  Pt laughing and holding hands. After visit pt seemed depressed and sad. "I am ready to go home."  Denies AVH and SI/HI at this time.  C/o pain in left middle toe.Less swollen than yesterday. A:Pt instructed to talk with doctor and social worker about discharge plans.  R:Will continue to monitor for safety.

## 2015-06-20 NOTE — Plan of Care (Signed)
Problem: Alteration in mood Goal: LTG-Patient reports reduction in suicidal thoughts (Patient reports reduction in suicidal thoughts and is able to verbalize a safety plan for whenever patient is feeling suicidal)  Outcome: Progressing Pt denies SI at this time     

## 2015-06-21 DIAGNOSIS — F250 Schizoaffective disorder, bipolar type: Principal | ICD-10-CM

## 2015-06-21 MED ORDER — LORAZEPAM 1 MG PO TABS
1.0000 mg | ORAL_TABLET | Freq: Once | ORAL | Status: AC
  Administered 2015-06-21: 1 mg via ORAL
  Filled 2015-06-21: qty 1

## 2015-06-21 MED ORDER — HYDROXYZINE HCL 50 MG PO TABS
50.0000 mg | ORAL_TABLET | Freq: Four times a day (QID) | ORAL | Status: DC | PRN
  Administered 2015-06-22: 50 mg via ORAL
  Filled 2015-06-21: qty 1
  Filled 2015-06-21: qty 10

## 2015-06-21 NOTE — Progress Notes (Signed)
[facility]  06[facility]2015  [name]  MRN:  [id number]   Subjective: "I'm having increased anxiety and I take xanax 10 mg ".  Objective:  [name] is awake, alert and oriented X4 , found interacting with peers in the dayroom. .  Denies suicidal or homicidal ideation. Denies auditory or visual hallucination and does not appear to be responding to internal stimuli. Patient interacts well with staff and others. Patient reports she is medication compliant without mediation side effects. Patient reports increased anxiety reports she is ready to be discharged. States she take "xanax 10 mg which is [facility]Wright [facility] [place] Patient is unable to express and recent or current stressor. States that "Vistaril is not helping." States her depression is 5/10. Patient denies using/learing any new coping skills to help with her anxiety. Reports good appetite and resting well.  Patient is inquiring about discharge for Monday. Support, encouragement and reassurance was provided.    Principal Problem: Schizoaffective disorder, bipolar type (HCC)  Diagnosis:   Patient Active Problem List   Diagnosis Date Noted  . Hyperprolactinemia (HCC) [E22.1] 06/17/2015  . Psychosis [F29] 06/15/2015  . Schizoaffective disorder, bipolar type (HCC) [F25.0] 06/15/2015  . Cocaine use disorder, severe, dependence (HCC) [F14.20] 06/15/2015  . Alcohol use disorder, moderate, dependence (HCC) [F10.20] 06/15/2015  . PTSD (post-traumatic stress disorder) [F43.10] 06/15/2015  . Cannabis abuse [F12.10] 06/15/2015  . Rectal ulceration [K62.6] 07/08/2014  . Rectal bleeding [K62.5] 07/05/2014  . Rectal bleed [K62.5] 07/05/2014   Total Time spent with patient: 15 minutes  Past Psychiatric History: Pt reports hx of schizoaffective Disorder, depression, PTSD as well as polysubstance abuse and cluster B personality traits. Pt has had several [place]. Pt reports several suicide attempts in the past.Pt also has self mutilating behavior.  Past Medical History:  Past Medical History  Diagnosis Date  . Constipation   . Anxiety   . Mental disorder   . ADHD (attention deficit hyperactivity disorder)   . Personality disorder   . Depression   . Anxiety disorder   . Substance abuse     Past Surgical History  Procedure Laterality Date  . Cesarean section    . Hernia repair    . Flexible sigmoidoscopy N/A 07/06/2014    Procedure: FLEXIBLE SIGMOIDOSCOPY;  Surgeon: Florencia Reasonsobert Buccini V, MD;  [facility];  Service: Endoscopy;  Laterality: N/A;   Family History:  Family History  Problem Relation Age of Onset  . Alcohol abuse Neg Hx   . Arthritis Neg Hx   . Asthma Neg Hx   . Birth defects Neg Hx   . Cancer Neg Hx   . COPD Neg Hx   . Depression Neg Hx   . Drug abuse Neg Hx   . Early death Neg Hx   . Hearing loss Neg Hx   . Heart disease Neg Hx   . Hyperlipidemia Neg Hx   . Kidney disease Neg Hx   . Learning disabilities Neg Hx   . Mental illness Neg Hx   . Mental retardation Neg Hx   . Miscarriages / Stillbirths Neg Hx   . Stroke Neg Hx   . Vision loss Neg Hx   . Varicose Veins Neg Hx   . Schizophrenia Mother   . Alcoholism Brother   . Hypertension Maternal Grandfather   . Diabetes Maternal Grandfather    Family Psychiatric  History:  Pt reports  that her mother has a hx of schizophrenia, her brother is an alcoholic . Denies suicide in family.  Social History: Pt went up to college. Pt is on SSD for mental illness. Pt is single , has three children , 1y,2y,3y old , her mother is the legal guardian of her children. Pt denies pending legal charges. History       History  Alcohol Use  . 1.2 oz/week  . 2 Cans of beer per week    Comment: UTA     History  Drug Use  . Yes  . Special: Cocaine, "Crack" cocaine    Social History   Social History  . Marital Status: Single    Spouse  Name: N/A  . Number of Children: N/A  . Years of Education: N/A   Social History Main Topics  . Smoking status: Current Every Day Smoker  . Smokeless tobacco: None  . Alcohol Use: 1.2 oz/week    2 Cans of beer per week     Comment: UTA  . Drug Use: Yes    Special: Cocaine, "Crack" cocaine  . Sexual Activity: Not Asked   Other Topics Concern  . None   Social History Narrative   Additional Social History:    Pain Medications: see MAR Prescriptions: see MAR Over the Counter: see MAR History of alcohol / drug use?: Yes Negative Consequences of Use: Personal relationships, Financial Withdrawal Symptoms: Other (Comment) (none)  Sleep: Poor  Appetite:  Good  Current Medications: Current Facility-Administered Medications  Medication Dose Route Frequency Provider Last Rate Last Dose  . acetaminophen (TYLENOL) tablet 650 mg  650 mg Oral Q6H PRN Sanjuana Kava, NP   650 mg at 06/19/15 1610  . alum & mag hydroxide-simeth (MAALOX/MYLANTA) 200-200-20 MG/5ML suspension 30 mL  30 mL Oral Q4H PRN Sanjuana Kava, NP      . ARIPiprazole (ABILIFY) tablet 7.5 mg  7.5 mg Oral QHS Beau Fanny, FNP   7.5 mg at 06/20/15 2219  . benztropine (COGENTIN) tablet 0.5 mg  0.5 mg Oral QHS Jomarie Longs, MD   0.5 mg at 06/20/15 2218  . chlordiazePOXIDE (LIBRIUM) capsule 25 mg  25 mg Oral Q4H PRN Jomarie Longs, MD      . citalopram (CELEXA) tablet 10 mg  10 mg Oral Daily Jomarie Longs, MD   10 mg at 06/21/15 0854  . divalproex (DEPAKOTE) DR tablet 250 mg  250 mg Oral QAC breakfast Beau Fanny, FNP   250 mg at 06/21/15 9604  . divalproex (DEPAKOTE) DR tablet 500 mg  500 mg Oral QHS Beau Fanny, FNP   500 mg at 06/20/15 2218  . feeding supplement (ENSURE ENLIVE) (ENSURE ENLIVE) liquid 237 mL  237 mL Oral TID BM Saramma Eappen, MD   237 mL at 06/21/15 1419  . fluticasone (FLONASE) 50 MCG/ACT nasal spray 1 spray  1 spray Each Nare Daily Sanjuana Kava, NP   1 spray at 06/21/15 0854  . hydrOXYzine  (ATARAX/VISTARIL) tablet 25 mg  25 mg Oral Q6H PRN Jomarie Longs, MD   25 mg at 06/20/15 0330  . ibuprofen (ADVIL,MOTRIN) tablet 600 mg  600 mg Oral Q6H PRN Sanjuana Kava, NP   600 mg at 06/19/15 2309  . loratadine (CLARITIN) tablet 10 mg  10 mg Oral Daily Sanjuana Kava, NP   10 mg at 06/21/15 0854  . magnesium hydroxide (MILK OF MAGNESIA) suspension 30 mL  30 mL Oral Daily PRN Sanjuana Kava, NP  30 mL at 06/20/15 0915  . nicotine (NICODERM CQ - dosed in mg/24 hours) patch 21 mg  21 mg Transdermal Q0600 Sanjuana Kava, NP   21 mg at 06/15/15 1304  . OLANZapine (ZYPREXA) tablet 5 mg  5 mg Oral Q8H PRN Jomarie Longs, MD   5 mg at 06/15/15 1300   Or  . OLANZapine (ZYPREXA) injection 5 mg  5 mg Intramuscular Q8H PRN Saramma Eappen, MD      . prazosin (MINIPRESS) capsule 1 mg  1 mg Oral QHS Jomarie Longs, MD   1 mg at 06/20/15 2218  . traZODone (DESYREL) tablet 150 mg  150 mg Oral QHS Sanjuana Kava, NP   150 mg at 06/20/15 2218    Lab Results:  No results found for this or any previous visit (from the past 48 hour(s)).  Physical Findings: AIMS: Facial and Oral Movements Muscles of Facial Expression: None, normal Lips and Perioral Area: None, normal Jaw: None, normal Tongue: None, normal,Extremity Movements Upper (arms, wrists, hands, fingers): None, normal Lower (legs, knees, ankles, toes): None, normal, Trunk Movements Neck, shoulders, hips: None, normal, Overall Severity Severity of abnormal movements (highest score from questions above): None, normal Incapacitation due to abnormal movements: None, normal Patient's awareness of abnormal movements (rate only patient's report): No Awareness, Dental Status Current problems with teeth and/or dentures?: No Does patient usually wear dentures?: No  CIWA:  CIWA-Ar Total: 0 COWS:  COWS Total Score: 1  Musculoskeletal: Strength & Muscle Tone: within normal limits Gait & Station: normal Patient leans: N/A  Psychiatric Specialty  Exam: Review of Systems  Psychiatric/Behavioral: Positive for depression and substance abuse. The patient is nervous/anxious.   All other systems reviewed and are negative.   Blood pressure 110/59, pulse 79, temperature 98.1 F (36.7 C), temperature source Oral, resp. rate 18, SpO2 98 %.There is no weight on file to calculate BMI.  General Appearance: Casual and Disheveled  Eye Contact::  Good  Speech:  Clear and Coherent and Normal Rate  Volume:  Decreased  Mood:  Anxious and Depressed  Affect:  Depressed and Labile  Thought Process:  Goal Directed  Orientation:  Full (Time, Place, and Person)  Thought Content:  Hallucinations: Auditory Visual, Paranoid Ideation and Rumination   Suicidal Thoughts:  No  Homicidal Thoughts:  No  Memory:  Immediate;   Fair Recent;   Fair Remote;   Fair  Judgement:  Impaired  Insight:  Shallow  Psychomotor Activity:  Decreased and Restlessness  Concentration:  Poor  Recall:  [employer] of Knowledge:Fair  Language: Fair  Akathisia:  No  Handed:  Right  AIMS (if indicated):     Assets:  Physical Health  ADL's:  Intact  Cognition: WNL  Sleep:  Number of Hours: 6.25     I agree with current treatment plan on 06/21/2015, Patient seen face-to-face for psychiatric evaluation follow-up, chart reviewed and case discussed with the MD [place],  Reviewed the information documented and agree with the treatment plan.   Daily contact with patient to assess and evaluate symptoms and progress in treatment and Medication management:  depakote level on 06/19/15, resulted at 40 this AM, Prolactin is 33.2  Patient reports that she wants to be discharged on a Long-acting-anti-psychotics - Hence discussed Abilify - she is agreeable. Abilify to be increased over the next few days depending on how she tolerates it. Will taper down Zyprexa.  Will provide Abilify Maintena 400 mg IM q 30 days - prior to discharge ,  if she tolerates Abilify well.  -continue  Depakote to   AM and  qhs for mood stabilization (was 250 bid, Depakote level low at 40 as above and pt still very anxious) -Continue Trazodone 150 mg po qhs from prn to routine for sleep due to complaints of insomnia. - Increase  Vistaril 25 mg to 50 mg po q6h prn for anxiety sx.  Ativan 1 mg for increased anxiety/ agitation/1time order -Increase Abilify to 7.5mg  qhs for psychosis (was ) -Continue celexa 10 mg po daily for PTSD sx. -Continue Prazosin 1 mg po qhs for nightmares. -Will continue CIWA/Librium prn for alcohol withdrawal sx. -Continue Zyprexa 5 mg po q8h prn for anxiety/agitation, severe. -Will continue to monitor vitals ,medication compliance and treatment side effects while patient is here.  -Will monitor for medical issues as well as call consult as needed.  -Continue Claritin 10 mg for congestion & Flonase nasal congestions, -Ibuprofen 600 mg prn for toe pain. -elevated prolactin level- pt to follow this on an out patient basis. -CSW will start working on disposition.  -Patient to participate in therapeutic milieu.   Oneta Rack, FNP-BC 06/21/2015, 1:15 PM I agree with assessment and plan Madie Reno A. Dub Mikes, M.D.

## 2015-06-21 NOTE — Plan of Care (Signed)
Problem: Diagnosis: Increased Risk For Suicide Attempt Goal: STG-Patient Will Report Suicidal Feelings to Staff Outcome: Progressing Pt denies SI at this time.

## 2015-06-21 NOTE — Progress Notes (Signed)
D:Per patient self inventory form pt reports she slept good last night. She reports a good appetite, normal energy level, good concentration. She rates depression 0/10, hopelessness 0/10, anxiety 7/10- all on 0-10 scale, 10 being the worse. Pt denies s/s of withdrawals. Pt denies SI/HI. Denies AVH. Pt reports her goal is "to stay out of bed talk to people"   A:special checks q 15 mins in place for safety. Medication administered per MD order(see eMAR). Encouragement and support provided. CIWA protocol in place.  R:Safety maintained. Compliant with medication regimen. Will continue to monitor

## 2015-06-21 NOTE — Progress Notes (Signed)
[facility] Group Notes:  (Nursing/MHT/Case Management/Adjunct)  Date:  06/21/2015  Time:  12:01 AM  Type of Therapy:  Psychoeducational Skills  Participation Level:  Active  Participation Quality:  Redirectable  Affect:  Appropriate  Cognitive:  Appropriate  Insight:  Appropriate  Engagement in Group:  Distracting  Modes of Intervention:  Education  Summary of Progress/Problems: The patient described her day as having been "wonderful" overall. She states that she was pleased with the fact that she slept during the daytime and that she went outside for fresh air as well. As a theme for the day, her coping skill will be to listen to music.   [name] 06/21/2015, 12:01 AM

## 2015-06-21 NOTE — Progress Notes (Signed)
Adult Psychoeducational Group Note  Date:  06/21/2015 Time:  9:13 PM  Group Topic/Focus:  Wrap-Up Group:   The focus of this group is to help patients review their daily goal of treatment and discuss progress on daily workbooks.  Participation Level:  Active  Participation Quality:  Appropriate and Attentive  Affect:  Appropriate  Cognitive:  Appropriate  Insight: Appropriate and Good  Engagement in Group:  Engaged  Modes of Intervention:  Education  Additional Comments:  Pt overall had a ok day and is focused on her goal tomorrow which is to make it home.   Merlinda FrederickKeshia S Ernesha Ramone 06/21/2015, 9:13 PM

## 2015-06-21 NOTE — Progress Notes (Signed)
D: Pt denies SI/HI/AV. Pt is pleasant and cooperative.  A: Pt was offered support and encouragement. Pt was given scheduled medications. Pt was encouraged to attend groups. Q 15 minute checks were done for safety.  R:Pt attends groups and interacts well with peers and staff. Pt is taking medication. Pt has no complaints.Pt receptive to treatment and safety maintained on unit. 

## 2015-06-21 NOTE — [facility] Group Notes (Signed)
[facility] Group Notes: (Clinical Social Work)   06/21/2015      Type of Therapy:  Group Therapy   Participation Level:  Did Not Attend despite MHT prompting   Ambrose MantleMareida Grossman-Orr, LCSW 06/21/2015, 5:21 PM

## 2015-06-22 MED ORDER — BENZTROPINE MESYLATE 0.5 MG PO TABS: 0.5000 mg | ORAL_TABLET | Freq: Every day | ORAL | Status: DC

## 2015-06-22 MED ORDER — NICOTINE 21 MG/24HR TD PT24: 21.0000 mg | MEDICATED_PATCH | Freq: Every day | TRANSDERMAL | Status: DC

## 2015-06-22 MED ORDER — ARIPIPRAZOLE ER 400 MG IM SUSR: 400.0000 mg | INTRAMUSCULAR | Status: DC

## 2015-06-22 MED ORDER — LORATADINE 10 MG PO TABS: 10.0000 mg | ORAL_TABLET | Freq: Every day | ORAL | Status: DC

## 2015-06-22 MED ORDER — ARIPIPRAZOLE ER 400 MG IM SUSR
400.0000 mg | INTRAMUSCULAR | Status: DC
  Administered 2015-06-22: 400 mg via INTRAMUSCULAR

## 2015-06-22 MED ORDER — PRAZOSIN HCL 1 MG PO CAPS: 1.0000 mg | ORAL_CAPSULE | Freq: Every day | ORAL | Status: DC

## 2015-06-22 MED ORDER — CITALOPRAM HYDROBROMIDE 10 MG PO TABS: 10.0000 mg | ORAL_TABLET | Freq: Every day | ORAL | Status: DC

## 2015-06-22 MED ORDER — FLUTICASONE PROPIONATE 50 MCG/ACT NA SUSP: 1.0000 | Freq: Every day | NASAL | Status: DC

## 2015-06-22 MED ORDER — ARIPIPRAZOLE 15 MG PO TABS: 7.5000 mg | ORAL_TABLET | Freq: Every day | ORAL | Status: DC

## 2015-06-22 MED ORDER — DIVALPROEX SODIUM 250 MG PO DR TAB: 250.0000 mg | DELAYED_RELEASE_TABLET | Freq: Every day | ORAL | Status: DC

## 2015-06-22 MED ORDER — HYDROXYZINE HCL 50 MG PO TABS: 50.0000 mg | ORAL_TABLET | Freq: Four times a day (QID) | ORAL | Status: DC | PRN

## 2015-06-22 MED ORDER — TRAZODONE HCL 150 MG PO TABS: 150.0000 mg | ORAL_TABLET | Freq: Every day | ORAL | Status: DC

## 2015-06-22 NOTE — Progress Notes (Signed)
D: Pt denies SI/HI/AVH. Pt is pleasant and cooperative. Pt stated she felt a little better . Pt plans to live with her BF in [place]  when she D/C .   A: Pt was offered support and encouragement. Pt was given scheduled medications. Pt was encourage to attend groups. Q 15 minute checks were done for safety.   R:Pt attends groups and interacts well with peers and staff. Pt is taking medication. Pt has no complaints.Pt receptive to treatment and safety maintained on unit.
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
## 2015-06-22 NOTE — Progress Notes (Addendum)
Pt d/c to home with boyfriend. D/c instructions and rx's given and reviewed with pt. Pt verbalizes understanding. Pt denies s.i. Pt belongings returned.

## 2015-06-22 NOTE — Plan of Care (Signed)
Problem: Ineffective individual coping Goal: LTG: Patient will report a decrease in negative feelings Outcome: Progressing Pt stated she felt better today Goal: STG: Patient will remain free from self harm Outcome: Progressing Pt safe on the unit
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
## 2015-06-22 NOTE — Progress Notes (Signed)
  [facility] Dis[facility] :  Will you be returning to the same living situation after discharge:  Yes,  home At discharge, do you have transportation home?: Yes,  boyfriend Do you have the ability to pay for your medications: Yes,  mental health  Release of information consent forms completed and in the chart;  Patient's signature needed at discharge.  Patient to Follow up at: Follow-up Information    Follow up with [facility].   Why:  Walk in between 8am-9am Monday through Friday for hospital follow-up/medication management/assessment for counseling services. Please go no more than 2 days after discharge from the hospital to be seen. Thank you.    Contact information:   [address] [place], [place] [address] Phone: [phone] Fax: [phone]      Next level of care provider [facility] Link: unknown  Patient denies SI/HI: Yes,  yes    Safety Planning and Suicide Prevention discussed: Yes,  yes  Have you used any form of tobacco in the last 30 days? (Cigarettes, Smokeless Tobacco, Cigars, and/or Pipes): Yes  Has patient been referred to the Quitline?: Patient refused referral  Ida Rogueorth, Kaci Dillie B 06/22/2015, 10:39 AM

## 2015-06-22 NOTE — [facility] Suicide Risk Assessment (Signed)
[facility]   Demographic Factors:  Unemployed  Total Time spent with patient: 30 minutes  Musculoskeletal: Strength & Muscle Tone: within normal limits Gait & Station: normal Patient leans: N/A  Psychiatric Specialty Exam: Physical Exam  Review of Systems  Psychiatric/Behavioral: Positive for substance abuse (CHRONIC). Negative for depression and suicidal ideas.  All other systems reviewed and are negative.   Blood pressure 110/78, pulse 89, temperature 98.3 F (36.8 C), temperature source Oral, resp. rate 16, SpO2 98 %.There is no weight on file to calculate BMI.  General Appearance: Casual  Eye Contact::  Fair  Speech:  Clear and Coherent409  Volume:  Normal  Mood:  Euthymic  Affect:  Appropriate  Thought Process:  Coherent  Orientation:  Full (Time, Place, and Person)  Thought Content:  WDL  Suicidal Thoughts:  No  Homicidal Thoughts:  No  Memory:  Immediate;   Fair Recent;   Fair Remote;   Fair  Judgement:  Fair  Insight:  Fair  Psychomotor Activity:  Normal  Concentration:  Fair  Recall:  F[employer]Fair  Fund of Knowledge:Fair  Language: Fair  Akathisia:  No  Handed:  Right  AIMS (if indicated):     Assets:  Communication Skills Desire for Improvement  Sleep:  Number of Hours: 4.5  Cognition: WNL  ADL's:  Intact   Have you used any form of tobacco in the last 30 days? (Cigarettes, Smokeless Tobacco, Cigars, and/or Pipes): Yes  Has this patient used any form of tobacco in the last 30 days? (Cigarettes, Smokeless Tobacco, Cigars, and/or Pipes) Yes, A prescription for Nicotine patch was provided.    Mental Status Per Nursing Assessment::   On Admission:  NA  Current Mental Status by Physician: pt denies SI/HI/AH/VH  Loss Factors: NA  Historical Factors: Impulsivity  Risk Reduction Factors:   Living with another person, especially a relative and Positive social support  Continued Clinical Symptoms:  Alcohol/Substance  Abuse/Dependencies Previous Psychiatric Diagnoses and Treatments  Cognitive Features That Contribute To Risk:  Polarized thinking    Suicide Risk:  Minimal: No identifiable suicidal ideation.  Patients presenting with no risk factors but with morbid ruminations; may be classified as minimal risk based on the severity of the depressive symptoms  Principal Problem: Schizoaffective disorder, bipolar type [facility](HCC) Discharge Diagnoses:  Patient Active Problem List   Diagnosis Date Noted  . Hyperprolactinemia (HCC) [E22.1] 06/17/2015  . Psychosis [F29] 06/15/2015  . Schizoaffective disorder, bipolar type (HCC) [F25.0] 06/15/2015  . Cocaine use disorder, severe, dependence (HCC) [F14.20] 06/15/2015  . Alcohol use disorder, moderate, dependence (HCC) [F10.20] 06/15/2015  . PTSD (post-traumatic stress disorder) [F43.10] 06/15/2015  . Cannabis abuse [F12.10] 06/15/2015  . Rectal ulceration [K62.6] 07/08/2014  . Rectal bleeding [K62.5] 07/05/2014  . Rectal bleed [K62.5] 07/05/2014    Follow-up Information    Follow up with [facility].   Why:  Walk in between 8am-9am Monday through Friday for hospital follow-up/medication management/assessment for counseling services. Please go no more than 2 days after discharge from the hospital to be seen. Thank you.    Contact information:   [address] [place], [place] [address] Phone: [phone] Fax: [phone]      Plan Of Care/Follow-up recommendations:  Activity:  No restrictions Diet:  regular Tests:  Follow up on Depakote level on 06/24/15 and Follow up on your Prolactin level as needed with your out patient psychiatrist Other:  Abilify Maintena IM 400 mg q 30 days - first dose today 06/22/15  Is  patient on multiple antipsychotic therapies at discharge:  No   Has Patient had three or more failed trials of antipsychotic monotherapy by history:  No  Recommended Plan for Multiple Antipsychotic Therapies: NA    Nansi Birmingham  MD 06/22/2015, 9:23 AM

## 2015-07-13 ENCOUNTER — Encounter (HOSPITAL_COMMUNITY): Payer: Self-pay | Admitting: Emergency Medicine

## 2015-07-13 ENCOUNTER — Emergency Department (HOSPITAL_COMMUNITY)
Admission: EM | Admit: 2015-07-13 | Discharge: 2015-07-13 | Disposition: A | Discharge status: Home / Self Care | Payer: Self-pay | Attending: Emergency Medicine | Admitting: Emergency Medicine

## 2015-07-13 DIAGNOSIS — Z9889 Other specified postprocedural states: Secondary | ICD-10-CM | POA: Insufficient documentation

## 2015-07-13 DIAGNOSIS — F17200 Nicotine dependence, unspecified, uncomplicated: Secondary | ICD-10-CM | POA: Insufficient documentation

## 2015-07-13 DIAGNOSIS — Z3202 Encounter for pregnancy test, result negative: Secondary | ICD-10-CM | POA: Insufficient documentation

## 2015-07-13 DIAGNOSIS — F419 Anxiety disorder, unspecified: Secondary | ICD-10-CM | POA: Insufficient documentation

## 2015-07-13 DIAGNOSIS — Z79899 Other long term (current) drug therapy: Secondary | ICD-10-CM | POA: Insufficient documentation

## 2015-07-13 DIAGNOSIS — F329 Major depressive disorder, single episode, unspecified: Secondary | ICD-10-CM | POA: Insufficient documentation

## 2015-07-13 DIAGNOSIS — R1084 Generalized abdominal pain: Secondary | ICD-10-CM | POA: Insufficient documentation

## 2015-07-13 DIAGNOSIS — Z91040 Latex allergy status: Secondary | ICD-10-CM | POA: Insufficient documentation

## 2015-07-13 LAB — COMPREHENSIVE METABOLIC PANEL
ALK PHOS: 115 U/L (ref 38–126)
ALT: 16 U/L (ref 14–54)
AST: 23 U/L (ref 15–41)
Albumin: 4.2 g/dL (ref 3.5–5.0)
Anion gap: 9 (ref 5–15)
BUN: 9 mg/dL (ref 6–20)
CALCIUM: 9.2 mg/dL (ref 8.9–10.3)
CHLORIDE: 102 mmol/L (ref 101–111)
CO2: 25 mmol/L (ref 22–32)
CREATININE: 0.81 mg/dL (ref 0.44–1.00)
Glucose, Bld: 136 mg/dL — ABNORMAL HIGH (ref 65–99)
Potassium: 4.4 mmol/L (ref 3.5–5.1)
Sodium: 136 mmol/L (ref 135–145)
Total Bilirubin: 0.3 mg/dL (ref 0.3–1.2)
Total Protein: 7.9 g/dL (ref 6.5–8.1)

## 2015-07-13 LAB — URINALYSIS, ROUTINE W REFLEX MICROSCOPIC
Bilirubin Urine: NEGATIVE
GLUCOSE, UA: NEGATIVE mg/dL
HGB URINE DIPSTICK: NEGATIVE
KETONES UR: NEGATIVE mg/dL
LEUKOCYTES UA: NEGATIVE
Nitrite: NEGATIVE
PROTEIN: NEGATIVE mg/dL
Specific Gravity, Urine: 1.034 — ABNORMAL HIGH (ref 1.005–1.030)
pH: 5.5 (ref 5.0–8.0)

## 2015-07-13 LAB — CBC
HCT: 41.4 % (ref 36.0–46.0)
Hemoglobin: 13.1 g/dL (ref 12.0–15.0)
MCH: 28.2 pg (ref 26.0–34.0)
MCHC: 31.6 g/dL (ref 30.0–36.0)
MCV: 89 fL (ref 78.0–100.0)
PLATELETS: 390 10*3/uL (ref 150–400)
RBC: 4.65 MIL/uL (ref 3.87–5.11)
RDW: 14.4 % (ref 11.5–15.5)
WBC: 8 10*3/uL (ref 4.0–10.5)

## 2015-07-13 LAB — POC URINE PREG, ED: PREG TEST UR: NEGATIVE

## 2015-07-13 NOTE — Discharge Instructions (Signed)

## 2015-07-13 NOTE — ED Provider Notes (Signed)
CSN: [id number]     Arrival date & time 07/13/15  1318 History   First MD Initiated Contact with Patient 07/13/15 1533     Chief Complaint  Patient presents with  . stomach cramps      (Consider location/radiation/quality/duration/timing/severity/associated sxs/prior Treatment) HPI Comments: Patient here complaining of stomach cramps since having Norplant placed about a year ago. Patient also notes right side of face pain times several days as well. Denies any persistent loss of vision. No fever or chills. Denies any vaginal bleeding. No urinary symptoms. Patient does not use corrective lenses. No headache. Transient blurred vision. Was [facility] for [facility] reasons and has not been able to get her medications filled. No treatment use was prior to arrival  The history is provided by the patient.    Past Medical History  Diagnosis Date  . Constipation   . Anxiety   . Mental disorder   . ADHD (attention deficit hyperactivity disorder)   . Personality disorder   . Depression   . Anxiety disorder   . Substance abuse    Past Surgical History  Procedure Laterality Date  . Cesarean section    . Hernia repair    . Flexible sigmoidoscopy N/A 07/06/2014    Procedure: FLEXIBLE SIGMOIDOSCOPY;  Surgeon: Florencia Reasonsobert Buccini V, MD;  [facility];  Service: Endoscopy;  Laterality: N/A;   Family History  Problem Relation Age of Onset  . Alcohol abuse Neg Hx   . Arthritis Neg Hx   . Asthma Neg Hx   . Birth defects Neg Hx   . Cancer Neg Hx   . COPD Neg Hx   . Depression Neg Hx   . Drug abuse Neg Hx   . Early death Neg Hx   . Hearing loss Neg Hx   . Heart disease Neg Hx   . Hyperlipidemia Neg Hx   . Kidney disease Neg Hx   . Learning disabilities Neg Hx   . Mental illness Neg Hx   . Mental retardation Neg Hx   . Miscarriages / Stillbirths Neg Hx   . Stroke Neg Hx   . Vision loss Neg Hx   . Varicose Veins Neg Hx   . Schizophrenia  Mother   . Alcoholism Brother   . Hypertension Maternal Grandfather   . Diabetes Maternal Grandfather    Social History  Substance Use Topics  . Smoking status: Current Every Day Smoker  . Smokeless tobacco: None  . Alcohol Use: 1.2 oz/week    2 Cans of beer per week     Comment: UTA   OB History    Gravida Para Term Preterm AB TAB SAB Ectopic Multiple Living   3 3  3      3      Review of Systems  All other systems reviewed and are negative.     Allergies  Latex  Home Medications   Prior to Admission medications   Medication Sig Start Date End Date Taking? Authorizing Provider  acetaminophen (TYLENOL) 500 MG tablet Take 1,000 mg by mouth every 6 (six) hours as needed.   Yes Historical Provider, MD  oxymetazoline (AFRIN) 0.05 % nasal spray Place 1 spray into both nostrils daily as needed for congestion.   Yes Historical Provider, MD  ARIPiprazole (ABILIFY) 15 MG tablet Take 0.5 tablets (7.5 mg total) by mouth at bedtime. Patient not taking: Reported on 07/13/2015 06/22/15   Oneta Rackanika N Lewis, NP  ARIPiprazole 400 MG SUSR Inject 400  mg into the muscle every 30 (thirty) days. Patient not taking: Reported on 07/13/2015 07/22/15   Oneta Rack, NP  benztropine (COGENTIN) 0.5 MG tablet Take 1 tablet (0.5 mg total) by mouth at bedtime. Patient not taking: Reported on 07/13/2015 06/22/15   Oneta Rack, NP  citalopram (CELEXA) 10 MG tablet Take 1 tablet (10 mg total) by mouth daily. Patient not taking: Reported on 07/13/2015 06/22/15   Oneta Rack, NP  divalproex (DEPAKOTE) 250 MG DR tablet Take 1 tablet (250 mg total) by mouth daily before breakfast. Take 1 tablet (250 mg) by mouth at 7:00 am, Take 2 tablets ( ) by mouth at bedtime 0900pm Patient not taking: Reported on 07/13/2015 06/22/15   Oneta Rack, NP  etonogestrel (NEXPLANON) 68 MG IMPL implant 1 each (68 mg total) by Subdermal route once. 02/2013 Patient not taking: Reported on 06/15/2015 08/11/14   Thermon Leyland, NP  ferrous sulfate 325 (65 FE) MG tablet Take 1 tablet (325 mg total) by mouth 2 (two) times daily with a meal. Patient not taking: Reported on 06/15/2015 08/11/14   Thermon Leyland, NP  fluticasone [facility]) 50 MCG/ACT nasal spray Place 1 spray into both nostrils daily. Patient not taking: Reported on 07/13/2015 06/22/15   Oneta Rack, NP  hydrOXYzine (ATARAX/VISTARIL) 50 MG tablet Take 1 tablet (50 mg total) by mouth every 6 (six) hours as needed for anxiety. Patient not taking: Reported on 07/13/2015 06/22/15   Oneta Rack, NP  loratadine (CLARITIN) 10 MG tablet Take 1 tablet (10 mg total) by mouth daily. Patient not taking: Reported on 07/13/2015 06/22/15   Oneta Rack, NP  nicotine (NICODERM CQ - DOSED IN MG/24 HOURS) 21 mg/24hr patch Place 1 patch (21 mg total) onto the skin daily at 6 (six) AM. Patient not taking: Reported on 07/13/2015 06/22/15   Oneta Rack, NP  prazosin (MINIPRESS) 1 MG capsule Take 1 capsule (1 mg total) by mouth at bedtime. Patient not taking: Reported on 07/13/2015 06/22/15   Oneta Rack, NP  traZODone (DESYREL) 150 MG tablet Take 1 tablet (150 mg total) by mouth at bedtime. Patient not taking: Reported on 07/13/2015 06/22/15   Oneta Rack, NP   BP 126/80 mmHg  Pulse 77  Temp(Src) 98 F (36.7 C) (Oral)  Resp 18  SpO2 100%  LMP  (LMP Unknown) Physical Exam  Constitutional: She is oriented to person, place, and time. She appears well-developed and well-nourished.  Non-toxic appearance. No distress.  HENT:  Head: Normocephalic and atraumatic.  Eyes: Conjunctivae, EOM and lids are normal. Pupils are equal, round, and reactive to light.  Neck: Normal range of motion. Neck supple. No tracheal deviation present. No thyroid mass present.  Cardiovascular: Normal rate, regular rhythm and normal heart sounds.  Exam reveals no gallop.   No murmur heard. Pulmonary/Chest: Effort normal and breath sounds normal. No stridor. No respiratory distress.  She has no decreased breath sounds. She has no wheezes. She has no rhonchi. She has no rales.  Abdominal: Soft. Normal appearance and bowel sounds are normal. She exhibits no distension. There is no tenderness. There is no rebound and no CVA tenderness.  Musculoskeletal: Normal range of motion. She exhibits no edema or tenderness.  Neurological: She is alert and oriented to person, place, and time. She has normal strength. No cranial nerve deficit or sensory deficit. GCS eye subscore is 4. GCS verbal subscore is 5. GCS motor subscore is 6.  Skin: Skin is  warm and dry. No abrasion and no rash noted.  Psychiatric: She has a normal mood and affect. Her speech is normal and behavior is normal.  Nursing note and vitals reviewed.   ED Course  Procedures (including critical care time) Labs Review Labs Reviewed  COMPREHENSIVE METABOLIC PANEL - Abnormal; Notable for the following:    Glucose, Bld 136 (*)    All other components within normal limits  URINALYSIS, ROUTINE W REFLEX MICROSCOPIC (NOT AT [facility]) - Abnormal; Notable for the following:    Specific Gravity, Urine 1.034 (*)    All other components within normal limits  CBC  POC URINE PREG, ED    Imaging Review No results found. I have personally reviewed and evaluated these images and lab results as part of my medical decision-making.   EKG Interpretation None      MDM   Final diagnoses:  None    Patient's labs reviewed with her no acute findings noted. Will give referral to the [facility]    Lorre Nick, MD 07/13/15 [phone]

## 2015-07-13 NOTE — ED Notes (Signed)
Per pt, [facility]-states right eye pain as well

## 2015-07-31 ENCOUNTER — Emergency Department (HOSPITAL_COMMUNITY)
Admission: EM | Admit: 2015-07-31 | Discharge: 2015-07-31 | Discharge status: Against Medical Advice | Payer: Medicaid - Out of State | Attending: Emergency Medicine | Admitting: Emergency Medicine

## 2015-07-31 ENCOUNTER — Encounter (HOSPITAL_COMMUNITY): Payer: Self-pay | Admitting: Emergency Medicine

## 2015-07-31 ENCOUNTER — Emergency Department (HOSPITAL_COMMUNITY): Payer: Medicaid - Out of State

## 2015-07-31 DIAGNOSIS — M79672 Pain in left foot: Secondary | ICD-10-CM | POA: Insufficient documentation

## 2015-07-31 DIAGNOSIS — F17200 Nicotine dependence, unspecified, uncomplicated: Secondary | ICD-10-CM | POA: Diagnosis not present

## 2015-07-31 NOTE — ED Notes (Signed)
Patient reports foot pain to top of foot radiating to toes x1 day. Denies injury to same, redness and swelling noted to top of left foot. Pedal pulse intact.

## 2015-07-31 NOTE — ED Notes (Signed)
Patient seen walking out by other patient's in waiting room.

## 2015-07-31 NOTE — ED Notes (Signed)
No answer from waiting room.

## 2015-07-31 NOTE — ED Notes (Signed)
Pt no longer in the lobby 

## 2016-01-04 IMAGING — CT CT ABD-PELV W/ CM
2 of 4 series · 15 of 46 positions shown, 17 images · IV contrast (Omni 300)
Comparison: None.

CLINICAL DATA: Left lower quadrant pain radiating to right lower
quadrant 5 weeks. Vomiting yesterday with bloody stool. Previous
hernia repair.

EXAM:
CT ABDOMEN AND PELVIS WITH CONTRAST
TECHNIQUE: Multidetector CT imaging of the abdomen and pelvis was performed
using the standard protocol following bolus administration of
intravenous contrast.
CONTRAST:  100mL OMNIPAQUE IOHEXOL 300 MG/ML  SOLN

[Series 2: abd/ pelvis 5.0 i30f 1 · axial · 0.64mm/px · z∈[-520,-135]mm · 12 of 88 slices shown, 14 images]
[im 7/88  soft-tissue]
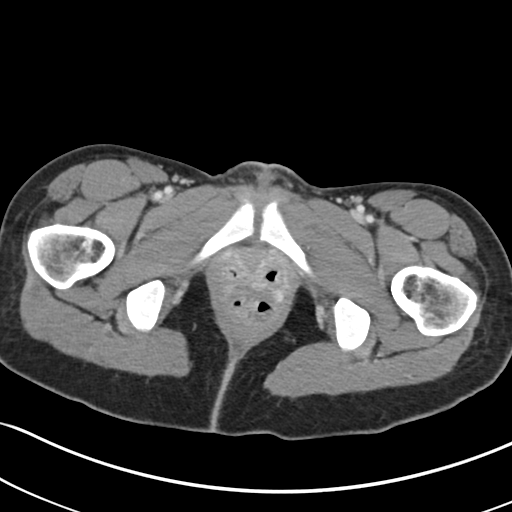
[im 7/88  bone]
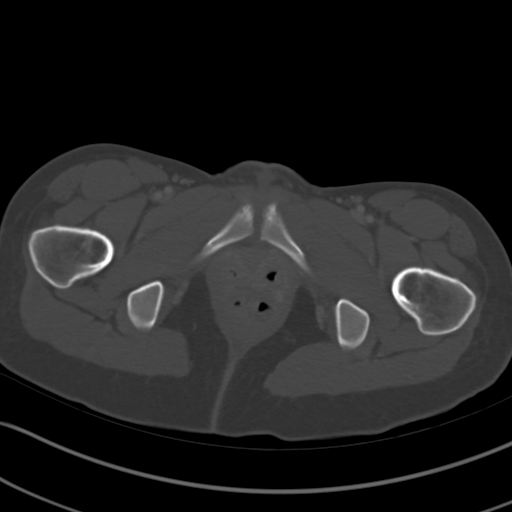
[im 14/88  soft-tissue]
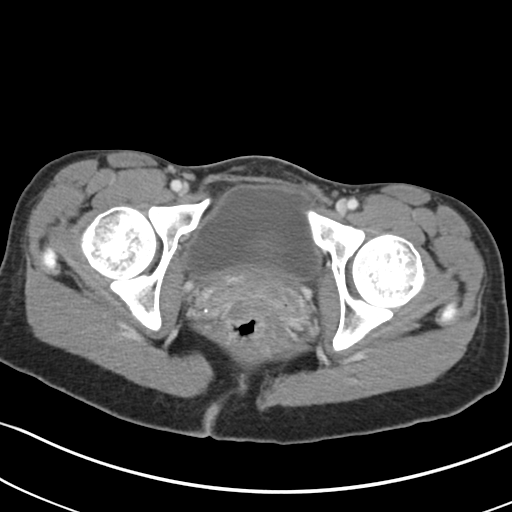
[im 21/88  soft-tissue]
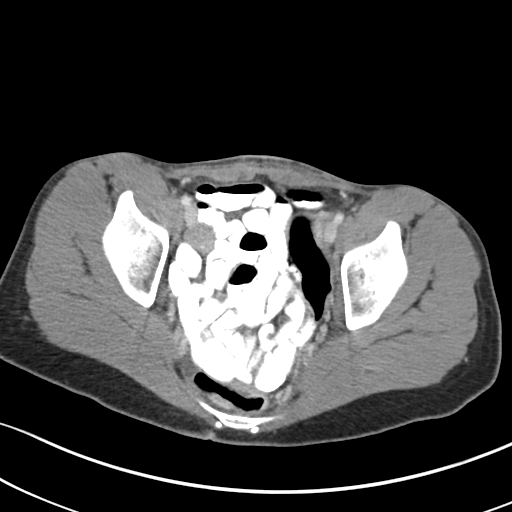
[im 28/88  soft-tissue]
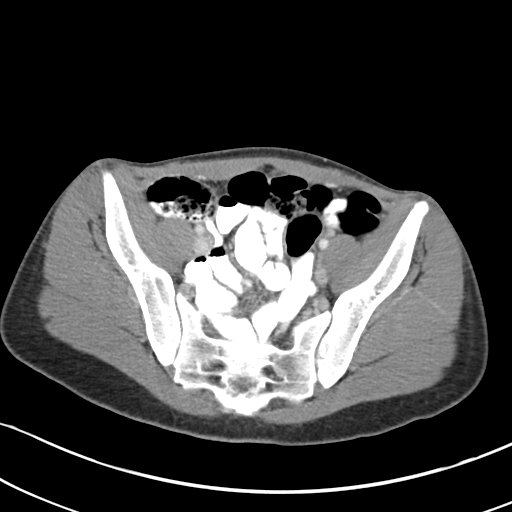
[im 35/88  soft-tissue]
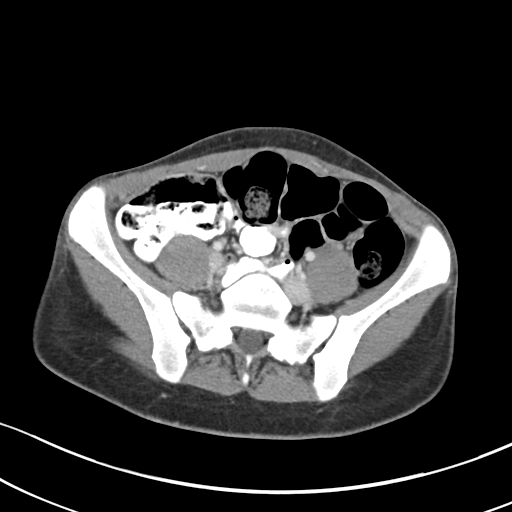
[im 42/88  soft-tissue]
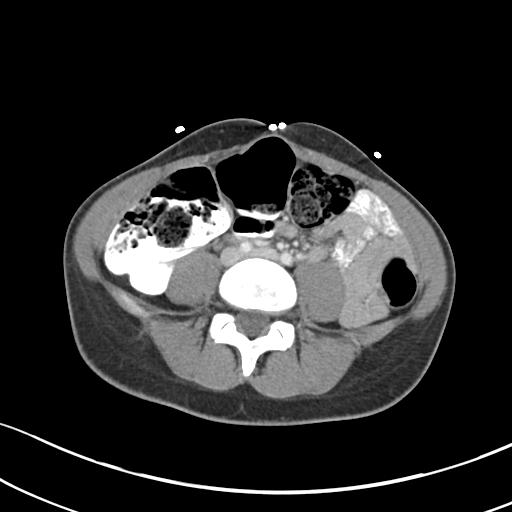
[im 49/88  soft-tissue]
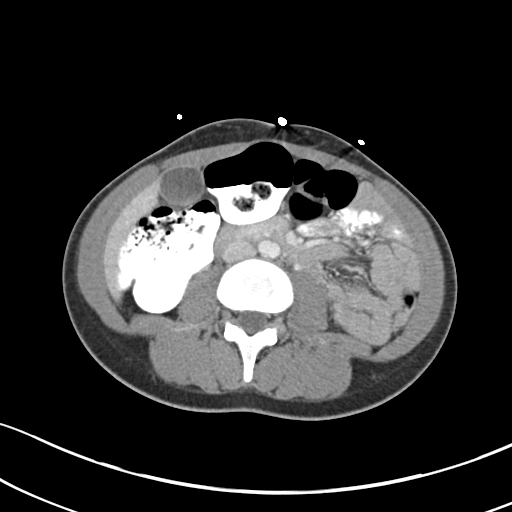
[im 56/88  soft-tissue]
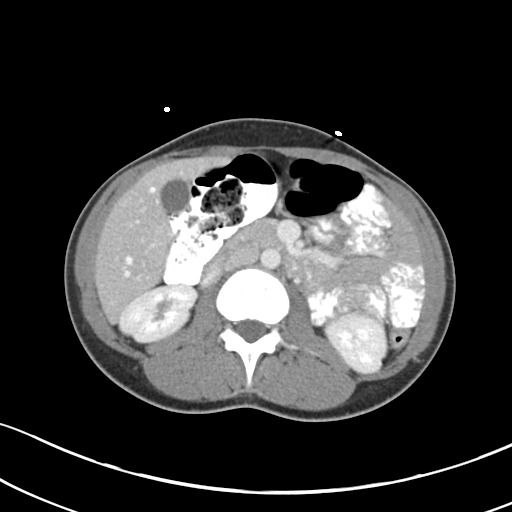
[im 63/88  soft-tissue]
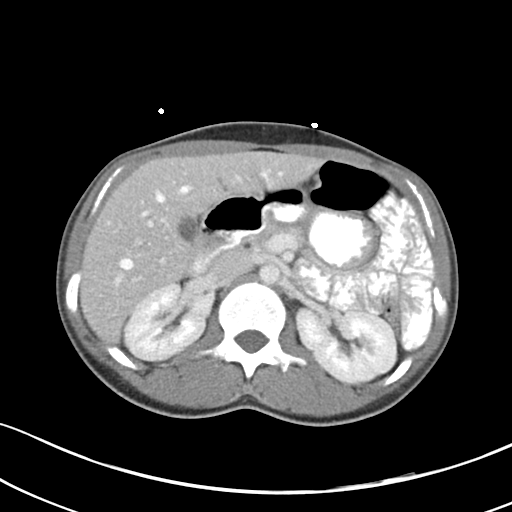
[im 63/88  bone]
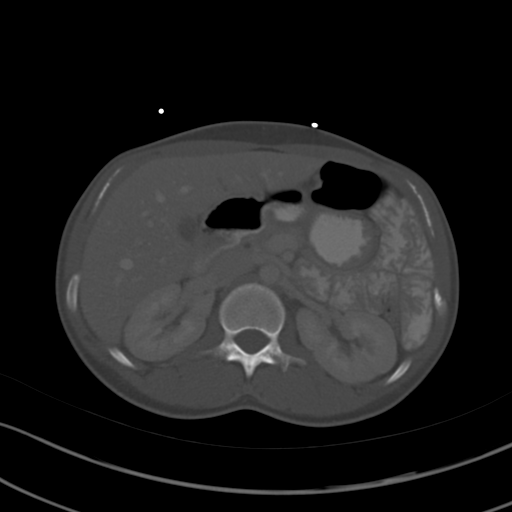
[im 70/88  soft-tissue]
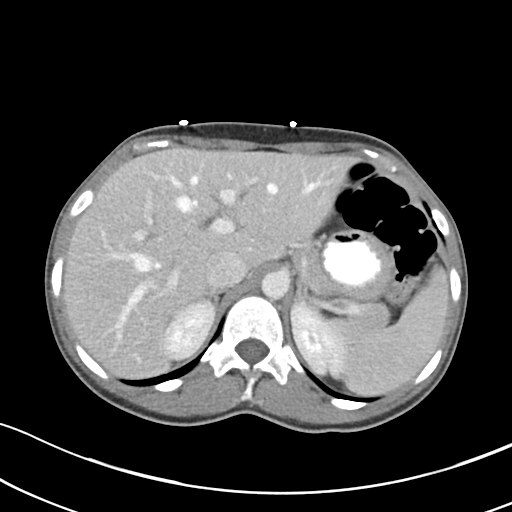
[im 77/88  soft-tissue]
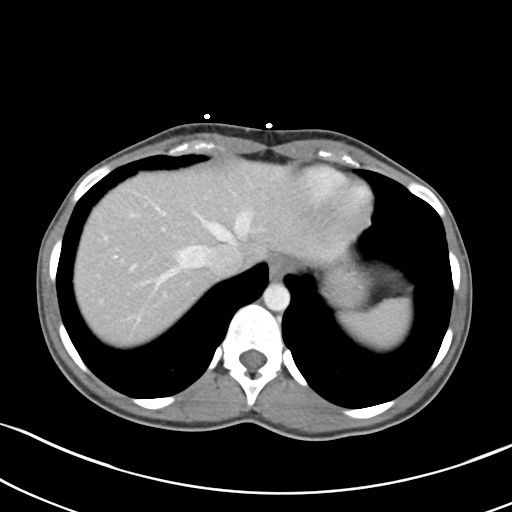
[im 84/88  soft-tissue]
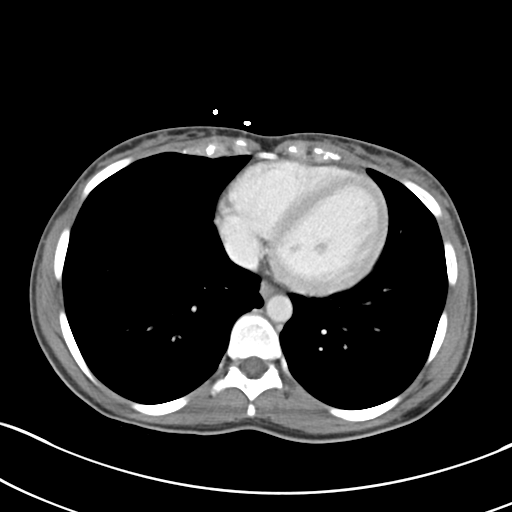

[Series 5: coronals · coronal · 0.55mm/px · 3 of 109 slices shown]
[im 37/109  soft-tissue]
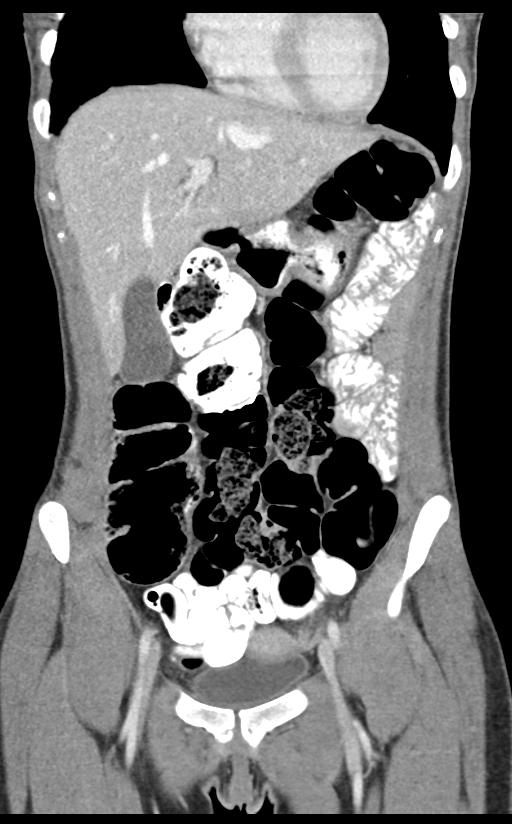
[im 49/109  soft-tissue]
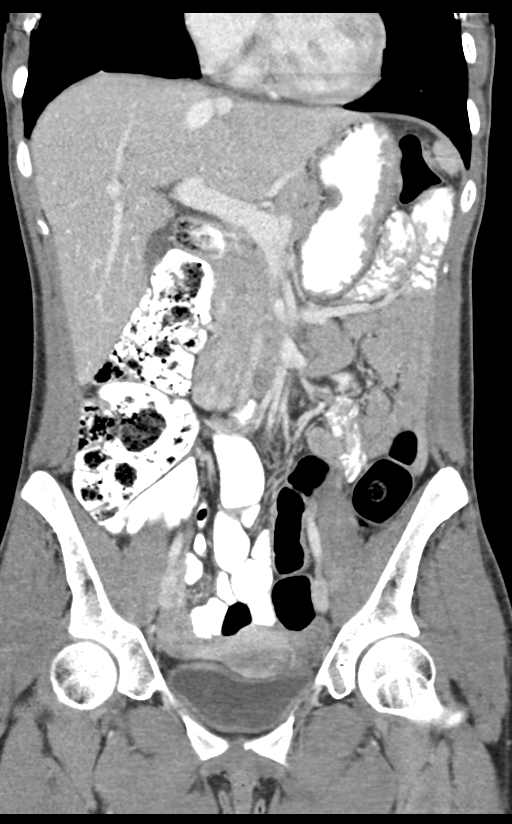
[im 61/109  soft-tissue]
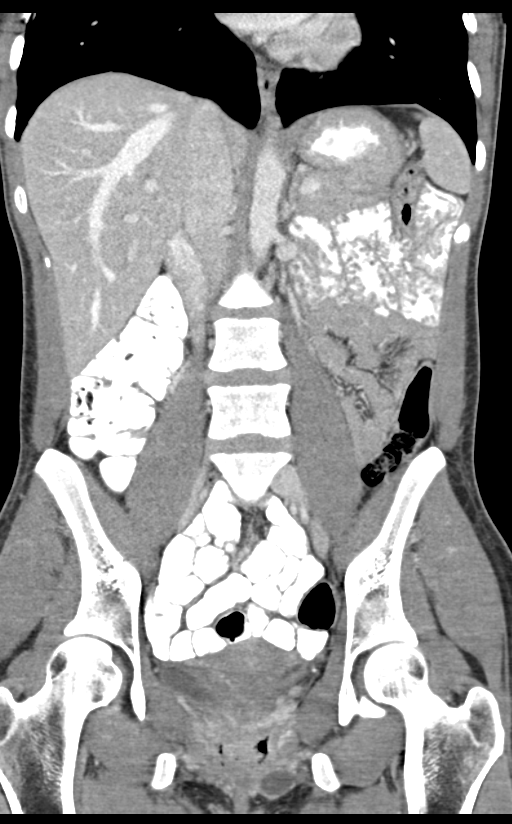

[15 of 46 positions shown; findings below may reference images not displayed]

FINDINGS: Lung bases are within normal.

Abdominal images demonstrate a normal liver, spleen, pancreas,
gallbladder and adrenal glands. Kidneys are normal in size without
hydronephrosis or nephrolithiasis. Appendix is normal. There is no
free fluid or focal inflammatory change. Small bowel is
unremarkable.

Pelvic images demonstrate the uterus, ovaries and bladder to be
within normal. Minimal rectal wall thickening as cannot exclude mild
proctitis. No adjacent inflammatory change or free fluid. 1.6 cm
cyst along the posterior lateral left vaginal wall likely a
Bartholin's gland cyst.
IMPRESSION: Mild rectal wall thickening which may be due to mild proctitis. No
adjacent inflammatory change or free fluid.

## 2017-10-31 ENCOUNTER — Emergency Department (HOSPITAL_COMMUNITY)
Admission: EM | Admit: 2017-10-31 | Discharge: 2017-10-31 | Disposition: A | Discharge status: Other Acute Inpatient Hospital | Payer: No Typology Code available for payment source | Attending: Emergency Medicine | Admitting: Emergency Medicine

## 2017-10-31 ENCOUNTER — Encounter (HOSPITAL_COMMUNITY): Payer: Self-pay | Admitting: Emergency Medicine

## 2017-10-31 ENCOUNTER — Encounter (HOSPITAL_COMMUNITY): Payer: Self-pay

## 2017-10-31 ENCOUNTER — Other Ambulatory Visit: Payer: Self-pay

## 2017-10-31 ENCOUNTER — Inpatient Hospital Stay (HOSPITAL_COMMUNITY)
Admission: AD | Admit: 2017-10-31 | Discharge: 2017-11-03 | DRG: 885 | Disposition: A | Discharge status: Home / Self Care | Payer: Medicaid - Out of State | Source: Intra-hospital | Attending: Psychiatry | Admitting: Psychiatry

## 2017-10-31 DIAGNOSIS — F4310 Post-traumatic stress disorder, unspecified: Secondary | ICD-10-CM | POA: Diagnosis present

## 2017-10-31 DIAGNOSIS — F329 Major depressive disorder, single episode, unspecified: Secondary | ICD-10-CM | POA: Diagnosis present

## 2017-10-31 DIAGNOSIS — Z79899 Other long term (current) drug therapy: Secondary | ICD-10-CM

## 2017-10-31 DIAGNOSIS — Z888 Allergy status to other drugs, medicaments and biological substances status: Secondary | ICD-10-CM | POA: Diagnosis not present

## 2017-10-31 DIAGNOSIS — Z91040 Latex allergy status: Secondary | ICD-10-CM

## 2017-10-31 DIAGNOSIS — R7989 Other specified abnormal findings of blood chemistry: Secondary | ICD-10-CM

## 2017-10-31 DIAGNOSIS — F32A Depression, unspecified: Secondary | ICD-10-CM

## 2017-10-31 DIAGNOSIS — F333 Major depressive disorder, recurrent, severe with psychotic symptoms: Secondary | ICD-10-CM | POA: Insufficient documentation

## 2017-10-31 DIAGNOSIS — Z046 Encounter for general psychiatric examination, requested by authority: Secondary | ICD-10-CM | POA: Diagnosis not present

## 2017-10-31 DIAGNOSIS — G4700 Insomnia, unspecified: Secondary | ICD-10-CM | POA: Diagnosis present

## 2017-10-31 DIAGNOSIS — Z818 Family history of other mental and behavioral disorders: Secondary | ICD-10-CM

## 2017-10-31 DIAGNOSIS — L209 Atopic dermatitis, unspecified: Secondary | ICD-10-CM | POA: Diagnosis present

## 2017-10-31 DIAGNOSIS — R945 Abnormal results of liver function studies: Secondary | ICD-10-CM | POA: Insufficient documentation

## 2017-10-31 DIAGNOSIS — R450 Nervousness: Secondary | ICD-10-CM | POA: Diagnosis not present

## 2017-10-31 DIAGNOSIS — F17200 Nicotine dependence, unspecified, uncomplicated: Secondary | ICD-10-CM | POA: Insufficient documentation

## 2017-10-31 DIAGNOSIS — F1420 Cocaine dependence, uncomplicated: Secondary | ICD-10-CM | POA: Diagnosis present

## 2017-10-31 DIAGNOSIS — F909 Attention-deficit hyperactivity disorder, unspecified type: Secondary | ICD-10-CM | POA: Diagnosis present

## 2017-10-31 DIAGNOSIS — F17210 Nicotine dependence, cigarettes, uncomplicated: Secondary | ICD-10-CM | POA: Diagnosis not present

## 2017-10-31 DIAGNOSIS — F419 Anxiety disorder, unspecified: Secondary | ICD-10-CM | POA: Diagnosis not present

## 2017-10-31 DIAGNOSIS — R45851 Suicidal ideations: Secondary | ICD-10-CM | POA: Diagnosis present

## 2017-10-31 DIAGNOSIS — F1410 Cocaine abuse, uncomplicated: Secondary | ICD-10-CM | POA: Insufficient documentation

## 2017-10-31 DIAGNOSIS — Z9114 Patient's other noncompliance with medication regimen: Secondary | ICD-10-CM

## 2017-10-31 DIAGNOSIS — F250 Schizoaffective disorder, bipolar type: Principal | ICD-10-CM | POA: Diagnosis present

## 2017-10-31 DIAGNOSIS — Z811 Family history of alcohol abuse and dependence: Secondary | ICD-10-CM | POA: Diagnosis not present

## 2017-10-31 LAB — CBC
HEMATOCRIT: 34.4 % — AB (ref 36.0–46.0)
Hemoglobin: 11.2 g/dL — ABNORMAL LOW (ref 12.0–15.0)
MCH: 28.1 pg (ref 26.0–34.0)
MCHC: 32.6 g/dL (ref 30.0–36.0)
MCV: 86.4 fL (ref 78.0–100.0)
Platelets: 277 10*3/uL (ref 150–400)
RBC: 3.98 MIL/uL (ref 3.87–5.11)
RDW: 14.6 % (ref 11.5–15.5)
WBC: 4.6 10*3/uL (ref 4.0–10.5)

## 2017-10-31 LAB — COMPREHENSIVE METABOLIC PANEL
ALBUMIN: 3.8 g/dL (ref 3.5–5.0)
ALT: 67 U/L — ABNORMAL HIGH (ref 14–54)
ANION GAP: 10 (ref 5–15)
AST: 80 U/L — AB (ref 15–41)
Alkaline Phosphatase: 122 U/L (ref 38–126)
BILIRUBIN TOTAL: 0.4 mg/dL (ref 0.3–1.2)
BUN: 10 mg/dL (ref 6–20)
CALCIUM: 8.6 mg/dL — AB (ref 8.9–10.3)
CO2: 20 mmol/L — ABNORMAL LOW (ref 22–32)
Chloride: 111 mmol/L (ref 101–111)
Creatinine, Ser: 0.57 mg/dL (ref 0.44–1.00)
GFR calc Af Amer: >60 mL/min (ref >60)
GFR calc non Af Amer: >60 mL/min (ref >60)
GLUCOSE: 86 mg/dL (ref 65–99)
Potassium: 3.4 mmol/L — ABNORMAL LOW (ref 3.5–5.1)
Sodium: 141 mmol/L (ref 135–145)
TOTAL PROTEIN: 7.4 g/dL (ref 6.5–8.1)

## 2017-10-31 LAB — RAPID URINE DRUG SCREEN, HOSP PERFORMED
Amphetamines: NOT DETECTED
BARBITURATES: NOT DETECTED
Benzodiazepines: NOT DETECTED
Cocaine: POSITIVE — AB
Opiates: NOT DETECTED
Tetrahydrocannabinol: NOT DETECTED

## 2017-10-31 LAB — ACETAMINOPHEN LEVEL: Acetaminophen (Tylenol), Serum: <10 ug/mL — ABNORMAL LOW (ref 10–30)

## 2017-10-31 LAB — ETHANOL: Alcohol, Ethyl (B): 22 mg/dL — ABNORMAL HIGH (ref <10)

## 2017-10-31 LAB — I-STAT BETA HCG BLOOD, ED (MC, WL, AP ONLY)

## 2017-10-31 LAB — SALICYLATE LEVEL: Salicylate Lvl: <7 mg/dL (ref 2.8–30.0)

## 2017-10-31 MED ORDER — ACETAMINOPHEN 325 MG PO TABS: 650.0000 mg | ORAL_TABLET | ORAL | Status: DC | PRN

## 2017-10-31 MED ORDER — DIPHENHYDRAMINE HCL 25 MG PO CAPS: 25.0000 mg | ORAL_CAPSULE | ORAL | Status: DC | PRN

## 2017-10-31 MED ORDER — HYDROXYZINE HCL 25 MG PO TABS
50.0000 mg | ORAL_TABLET | Freq: Four times a day (QID) | ORAL | Status: DC | PRN
  Administered 2017-10-31: 50 mg via ORAL
  Filled 2017-10-31: qty 1

## 2017-10-31 MED ORDER — CITALOPRAM HYDROBROMIDE 10 MG PO TABS
10.0000 mg | ORAL_TABLET | Freq: Every day | ORAL | Status: DC
  Administered 2017-10-31: 10 mg via ORAL
  Filled 2017-10-31: qty 1

## 2017-10-31 MED ORDER — TRAZODONE HCL 50 MG PO TABS: 150.0000 mg | ORAL_TABLET | Freq: Every day | ORAL | Status: DC

## 2017-10-31 MED ORDER — MAGNESIUM HYDROXIDE 400 MG/5ML PO SUSP: 30.0000 mL | Freq: Every day | ORAL | Status: DC | PRN

## 2017-10-31 MED ORDER — NICOTINE 21 MG/24HR TD PT24: 21.0000 mg | MEDICATED_PATCH | Freq: Every day | TRANSDERMAL | Status: DC

## 2017-10-31 MED ORDER — BENZTROPINE MESYLATE 0.5 MG PO TABS
0.5000 mg | ORAL_TABLET | Freq: Every day | ORAL | Status: DC
  Administered 2017-10-31: 0.5 mg via ORAL
  Filled 2017-10-31 (×4): qty 1

## 2017-10-31 MED ORDER — ARIPIPRAZOLE 15 MG PO TABS: 7.5000 mg | ORAL_TABLET | Freq: Every day | ORAL | Status: DC

## 2017-10-31 MED ORDER — HYDROXYZINE HCL 50 MG PO TABS
50.0000 mg | ORAL_TABLET | Freq: Four times a day (QID) | ORAL | Status: DC | PRN
  Administered 2017-10-31 – 2017-11-02 (×2): 50 mg via ORAL
  Filled 2017-10-31: qty 1
  Filled 2017-10-31: qty 10
  Filled 2017-10-31: qty 1

## 2017-10-31 MED ORDER — ALUM & MAG HYDROXIDE-SIMETH 200-200-20 MG/5ML PO SUSP: 30.0000 mL | ORAL | Status: DC | PRN

## 2017-10-31 MED ORDER — ARIPIPRAZOLE 15 MG PO TABS
7.5000 mg | ORAL_TABLET | Freq: Every day | ORAL | Status: DC
  Administered 2017-10-31: 7.5 mg via ORAL
  Filled 2017-10-31 (×3): qty 1

## 2017-10-31 MED ORDER — TRAZODONE HCL 150 MG PO TABS
150.0000 mg | ORAL_TABLET | Freq: Every day | ORAL | Status: DC
  Administered 2017-10-31: 150 mg via ORAL
  Filled 2017-10-31 (×3): qty 1

## 2017-10-31 MED ORDER — BENZTROPINE MESYLATE 1 MG PO TABS: 0.5000 mg | ORAL_TABLET | Freq: Every day | ORAL | Status: DC

## 2017-10-31 MED ORDER — NICOTINE 21 MG/24HR TD PT24
21.0000 mg | MEDICATED_PATCH | Freq: Every day | TRANSDERMAL | Status: DC
Start: 2017-11-01 — End: 2017-11-03
  Filled 2017-10-31 (×4): qty 1

## 2017-10-31 MED ORDER — CITALOPRAM HYDROBROMIDE 10 MG PO TABS
10.0000 mg | ORAL_TABLET | Freq: Every day | ORAL | Status: DC
  Filled 2017-10-31 (×2): qty 1

## 2017-10-31 NOTE — ED Notes (Signed)
Attempted to take pt to shower, shower is dirty at this time. EVS called. Will try again in an hour.

## 2017-10-31 NOTE — Tx Team (Signed)
Initial Treatment Plan 10/31/2017 6:30 PM Olivia RoesStanlesha Gagner ZOX:[id number]    PATIENT STRESSORS: Medication change or noncompliance Substance abuse   PATIENT STRENGTHS: General fund of knowledge Physical Health   PATIENT IDENTIFIED PROBLEMS: Psychosis  Suicidal ideation  "Get my medications straightened out"                 DISCHARGE CRITERIA:  Improved stabilization in mood, thinking, and/or behavior Verbal commitment to aftercare and medication compliance  PRELIMINARY DISCHARGE PLAN: Outpatient therapy Medication management  PATIENT/FAMILY INVOLVEMENT: This treatment plan has been presented to and reviewed with the patient, [name].  The patient and family have been given the opportunity to ask questions and make suggestions.  Levin BaconHeather V Lakeitha Basques, RN 10/31/2017, 6:30 PM

## 2017-10-31 NOTE — Progress Notes (Signed)
[facility] Shuvon Rankin,NP, is the accepting provider.  Dr. Altamese [place] is the attending provider.  Call report to [phone]  [email or web address] notified.   Pt is Voluntary.  Pt may be transported by [facility]  [facility]  Carney BernJean T. Kaylyn LimSutter, MSW, LCSWA Disposition Clinical Social Work [phone] (cell) [phone] (office)

## 2017-10-31 NOTE — ED Provider Notes (Signed)
[facility] EMERGENCY DEPARTMENT Provider Note   CSN: [id number] Arrival date & time: 10/31/17  0200     History   Chief Complaint Chief Complaint  Patient presents with  . Medical Clearance    HPI [name] is a 27 y.o. female.  HPI Patient presents to the emergency room for evaluation of depression and suicidal ideation.  Patient has a history of depression as well as substance abuse problems.  She finds that she has exacerbations when it comes around her child's birthday.  Patient used cocaine today and since then she has been feeling more depressed.  She is having suicidal ideation but no specific plan.  She does have a feeling of things crawling on her as well and feels like there is something in her left ear.  Patient denies any trouble with chest pain or shortness of breath.  No vomiting or diarrhea.  No fevers or chills. Past Medical History:  Diagnosis Date  . ADHD (attention deficit hyperactivity disorder)   . Anxiety   . Anxiety disorder   . Constipation   . Depression   . Mental disorder   . Personality disorder (HCC)   . Substance abuse [facility])     Patient Active Problem List   Diagnosis Date Noted  . Hyperprolactinemia (HCC) 06/17/2015  . Psychosis (HCC) 06/15/2015  . Schizoaffective disorder, bipolar type (HCC) 06/15/2015  . Cocaine use disorder, severe, dependence (HCC) 06/15/2015  . Alcohol use disorder, moderate, dependence (HCC) 06/15/2015  . PTSD (post-traumatic stress disorder) 06/15/2015  . Cannabis abuse 06/15/2015  . Rectal ulceration 07/08/2014  . Rectal bleeding 07/05/2014  . Rectal bleed 07/05/2014    Past Surgical History:  Procedure Laterality Date  . CESAREAN SECTION    . FLEXIBLE SIGMOIDOSCOPY N/A 07/06/2014   Procedure: FLEXIBLE SIGMOIDOSCOPY;  Surgeon: Florencia Reasons, MD;  Location: [facility] ENDOSCOPY;  Service: Endoscopy;  Laterality: N/A;  . HERNIA REPAIR       OB History    Gravida  3   Para  3   Term        Preterm  3   AB      Living  3     SAB      TAB      Ectopic      Multiple      Live Births               Home Medications    Prior to Admission medications   Medication Sig Start Date End Date Taking? Authorizing Provider  acetaminophen (TYLENOL) 500 MG tablet Take 1,000 mg by mouth every 6 (six) hours as needed.    [provider]  ARIPiprazole (ABILIFY) 15 MG tablet Take 0.5 tablets (7.5 mg total) by mouth at bedtime. Patient not taking: Reported on 07/13/2015 06/22/15   Oneta Rack, NP  ARIPiprazole 400 MG SUSR Inject 400 mg into the muscle every 30 (thirty) days. Patient not taking: Reported on 07/13/2015 07/22/15   Oneta Rack, NP  benztropine (COGENTIN) 0.5 MG tablet Take 1 tablet (0.5 mg total) by mouth at bedtime. Patient not taking: Reported on 07/13/2015 06/22/15   Oneta Rack, NP  citalopram (CELEXA) 10 MG tablet Take 1 tablet (10 mg total) by mouth daily. Patient not taking: Reported on 07/13/2015 06/22/15   Oneta Rack, NP  divalproex (DEPAKOTE) 250 MG DR tablet Take 1 tablet (250 mg total) by mouth daily before breakfast. Take 1 tablet (250 mg) by mouth at  7:00 am, Take 2 tablets (500mg ) by mouth at bedtime 0900pm Patient not taking: Reported on 07/13/2015 06/22/15   Oneta Rack, NP  etonogestrel (NEXPLANON) 68 MG IMPL implant 1 each (68 mg total) by Subdermal route once. 02/2013 Patient not taking: Reported on 06/15/2015 08/11/14   Thermon Leyland, NP  ferrous sulfate 325 (65 FE) MG tablet Take 1 tablet (325 mg total) by mouth 2 (two) times daily with a meal. Patient not taking: Reported on 06/15/2015 08/11/14   Thermon Leyland, NP  fluticasone [facility]) 50 MCG/ACT nasal spray Place 1 spray into both nostrils daily. Patient not taking: Reported on 07/13/2015 06/22/15   Oneta Rack, NP  hydrOXYzine (ATARAX/VISTARIL) 50 MG tablet Take 1 tablet (50 mg total) by mouth every 6 (six) hours as needed for anxiety. Patient not  taking: Reported on 07/13/2015 06/22/15   Oneta Rack, NP  loratadine (CLARITIN) 10 MG tablet Take 1 tablet (10 mg total) by mouth daily. Patient not taking: Reported on 07/13/2015 06/22/15   Oneta Rack, NP  nicotine (NICODERM CQ - DOSED IN MG/24 HOURS) 21 mg/24hr patch Place 1 patch (21 mg total) onto the skin daily at 6 (six) AM. Patient not taking: Reported on 07/13/2015 06/22/15   Oneta Rack, NP  oxymetazoline (AFRIN) 0.05 % nasal spray Place 1 spray into both nostrils daily as needed for congestion.    [provider]  prazosin (MINIPRESS) 1 MG capsule Take 1 capsule (1 mg total) by mouth at bedtime. Patient not taking: Reported on 07/13/2015 06/22/15   Oneta Rack, NP  traZODone (DESYREL) 150 MG tablet Take 1 tablet (150 mg total) by mouth at bedtime. Patient not taking: Reported on 07/13/2015 06/22/15   Oneta Rack, NP    Family History Family History  Problem Relation Age of Onset  . Schizophrenia Mother   . Alcoholism Brother   . Hypertension Maternal Grandfather   . Diabetes Maternal Grandfather   . Alcohol abuse Neg Hx   . Arthritis Neg Hx   . Asthma Neg Hx   . Birth defects Neg Hx   . Cancer Neg Hx   . COPD Neg Hx   . Depression Neg Hx   . Drug abuse Neg Hx   . Early death Neg Hx   . Hearing loss Neg Hx   . Heart disease Neg Hx   . Hyperlipidemia Neg Hx   . Kidney disease Neg Hx   . Learning disabilities Neg Hx   . Mental illness Neg Hx   . Mental retardation Neg Hx   . Miscarriages / Stillbirths Neg Hx   . Stroke Neg Hx   . Vision loss Neg Hx   . Varicose Veins Neg Hx     Social History Social History   Tobacco Use  . Smoking status: Current Every Day Smoker  . Smokeless tobacco: Never Used  Substance Use Topics  . Alcohol use: Yes    Alcohol/week: 1.2 oz    Types: 2 Cans of beer per week    Comment: UTA  . Drug use: Yes    Types: Cocaine, "Crack" cocaine     Allergies   Latex; Haldol [haloperidol lactate]; and  Risperdal [risperidone]   Review of Systems Review of Systems  All other systems reviewed and are negative.    Physical Exam Updated Vital Signs BP (!) 145/100 (BP Location: Right Arm)   Pulse 90   Temp 98.2 F (36.8 C) (Oral)   Resp 18  Ht 1.651 m (5\' 5" )   Wt 63.5 kg (140 lb)   SpO2 98%   BMI 23.30 kg/m   Physical Exam  Constitutional: She appears well-developed and well-nourished. No distress.  HENT:  Head: Normocephalic and atraumatic.  Right Ear: Tympanic membrane, external ear and ear canal normal.  Left Ear: Tympanic membrane, external ear and ear canal normal.  Eyes: Conjunctivae are normal. Right eye exhibits no discharge. Left eye exhibits no discharge. No scleral icterus.  Neck: Neck supple. No tracheal deviation present.  Cardiovascular: Normal rate, regular rhythm and intact distal pulses.  Pulmonary/Chest: Effort normal and breath sounds normal. No stridor. No respiratory distress. She has no wheezes. She has no rales.  Abdominal: Soft. Bowel sounds are normal. She exhibits no distension. There is no tenderness. There is no rebound and no guarding.  Musculoskeletal: She exhibits no edema or tenderness.  Neurological: She is alert. She has normal strength. No cranial nerve deficit (no facial droop, extraocular movements intact, no slurred speech) or sensory deficit. She exhibits normal muscle tone. She displays no seizure activity. Coordination normal.  Skin: Skin is warm and dry. No rash noted.  Psychiatric: Her speech is delayed. She is slowed and withdrawn. She exhibits a depressed mood. She expresses suicidal ideation.  Nursing note and vitals reviewed.    ED Treatments / Results  Labs (all labs ordered are listed, but only abnormal results are displayed) Labs Reviewed  COMPREHENSIVE METABOLIC PANEL - Abnormal; Notable for the following components:      Result Value   Potassium 3.4 (*)    CO2 20 (*)    Calcium 8.6 (*)    AST 80 (*)    ALT 67 (*)      All other components within normal limits  ETHANOL - Abnormal; Notable for the following components:   Alcohol, Ethyl (B) 22 (*)    All other components within normal limits  ACETAMINOPHEN LEVEL - Abnormal; Notable for the following components:   Acetaminophen (Tylenol), Serum <10 (*)    All other components within normal limits  CBC - Abnormal; Notable for the following components:   Hemoglobin 11.2 (*)    HCT 34.4 (*)    All other components within normal limits  RAPID URINE DRUG SCREEN, HOSP PERFORMED - Abnormal; Notable for the following components:   Cocaine POSITIVE (*)    All other components within normal limits  SALICYLATE LEVEL  I-STAT BETA HCG BLOOD, ED ([facility], [facility], [facility] ONLY)  I-STAT BETA HCG BLOOD, ED ([facility], [facility], [facility] ONLY)      Procedures Procedures (including critical care time)  Medications Ordered in ED Medications  acetaminophen (TYLENOL) tablet 650 mg (has no administration in time range)  diphenhydrAMINE (BENADRYL) capsule 25 mg (has no administration in time range)     Initial Impression / Assessment and Plan / ED Course  I have reviewed the triage vital signs and the nursing notes.  Pertinent labs & imaging results that were available during my care of the patient were reviewed by me and considered in my medical decision making (see chart for details).  Clinical Course as of Nov 01 750  Tue Oct 31, 2017  0750 Laboratory tests reviewed.  Cocaine is positive.  Mild increase in her LFTs but I doubt this is clinically significant.  Likely related to recent alcohol use. Should be rechecked in a month or so   [JK]  0751 Patient is medically cleared for psychiatric evaluation   [JK]    Clinical Course  User Index [JK] Linwood Dibbles, MD      Final Clinical Impressions(s) / ED Diagnoses   Final diagnoses:  Depression, unspecified depression type  Cocaine abuse [facility])  Elevated LFTs    ED Discharge Orders    None       Linwood Dibbles, MD 11/01/17 [phone]

## 2017-10-31 NOTE — ED Notes (Signed)
Pt refusing to give her phone to this nurse. Belongings put in bags for inventory.

## 2017-10-31 NOTE — ED Notes (Signed)
Regular diet meal tray ordered for pt at this time.

## 2017-10-31 NOTE — Discharge Instructions (Signed)
Recheck your liver blood tests in a month or so.  They were mildly elevated today.  Avoid any alcohol use.

## 2017-10-31 NOTE — ED Notes (Signed)
[facility] - to arrive at 1700.

## 2017-10-31 NOTE — ED Notes (Signed)
Pt provided with meal tray at this time 

## 2017-10-31 NOTE — Progress Notes (Signed)
[name] is a 27 year old female being admitted voluntarily to [facility] from [facility].  She came to the ED with suicidal ideation and noncompliance with medication.  She admits to using cocaine and alcohol.  She has multiple psychiatric hospitalizations in the past.  She has a history of self-harm with last cutting about 5 days ago.  [facility], she was pleasant and cooperative.  Affect flat.  She was not very forthcoming with information.  She denies any pain or discomfort.  She does admit to hearing voices and seeing things at times but none currently.  She reported that she just wants to get her medications straightened out.  Oriented her to the unit.  Admission paperwork completed and signed.  Belongings searched and secured in locker # 13, no contraband found.  Skin assessment completed and old scar to left forearm/wrist.  Q 15 minute checks initiated for safety.  We will continue to monitor the progress towards her goals.

## 2017-10-31 NOTE — ED Triage Notes (Signed)
Pt arrived [facility] for c/o SI and wants to be voluntarily committed. States she has no plan but that she has a hx of the same and it usually occurs when she does cocaine and around her child's birthday. Today she used cocaine.

## 2017-10-31 NOTE — Progress Notes (Signed)
Did not attend group 

## 2017-10-31 NOTE — ED Notes (Signed)
Pt to shower at this time supervised by staff

## 2017-10-31 NOTE — ED Triage Notes (Signed)
Pt states that she feel "things crawling on her and out of her ears."

## 2017-10-31 NOTE — BH Assessment (Signed)
Tele Assessment Note   Patient Name: [name] MRN: [id number] Referring Physician: Linwood Dibbles, MD Location of [facility]: [facility] Location of Provider: Behavioral Health TTS Department  [name] is a 27 y.o. female in to ED voluntarily due to [facility] w/ no plan. Pt is a poor historian and not very forthcoming with information. After much questioning and prompting, what was ascertained from pt is the following:  Pt is from [place] and has been in [place] for @ 2 weeks. Her last reported IP hospitalization was @ 2 months ago in [place] in [place]. Pt was there for "the same thing" as today. Pt was prescribed medication and said she was feeling better. Pt reports that she "cold [place] stopped" taking her medication, but her reason was unclear. Pt indicates that it had something to do with her Medicaid getting cut off. Pt was in the ED at [facility] @ a month ago for "the same thing". She was d/c and told to [facility] and [facility]. Pt reports [facility]'t [facility] b/c she "forgot".   Pt admits to cocaine use, even though she reports "barely" using it. She also admits to daily drinking (2 beers daily). Pt additionally reports auditory and visual hallucinations of people talking about her and watching her. She reports experiencing these hallucinations since she was "younger". Pt also indicates that the voices are command "sometimes".   Pt reports that she cuts herself and burns herself (after clinician offered these behaviors as possible self-injuring behaviors), last cutting herself @ 5 days ago. Pt reports having attempted suicide "plenty" of times. Pt doesn't indicate a plan of suicide, but reports feeling "hopeless". Pt would like IP treatment to get back on her "proper meds".   Diagnosis: MDD, recurrent episode, severe  Past Medical History:  Past Medical History:  Diagnosis Date  . ADHD (attention deficit hyperactivity disorder)   . Anxiety   . Anxiety disorder   . Constipation    . Depression   . Mental disorder   . Personality disorder (HCC)   . Substance abuse [facility])     Past Surgical History:  Procedure Laterality Date  . CESAREAN SECTION    . FLEXIBLE SIGMOIDOSCOPY N/A 07/06/2014   Procedure: FLEXIBLE SIGMOIDOSCOPY;  Surgeon: Florencia Reasons, MD;  Location: [facility] ENDOSCOPY;  Service: Endoscopy;  Laterality: N/A;  . HERNIA REPAIR      Family History:  Family History  Problem Relation Age of Onset  . Schizophrenia Mother   . Alcoholism Brother   . Hypertension Maternal Grandfather   . Diabetes Maternal Grandfather   . Alcohol abuse Neg Hx   . Arthritis Neg Hx   . Asthma Neg Hx   . Birth defects Neg Hx   . Cancer Neg Hx   . COPD Neg Hx   . Depression Neg Hx   . Drug abuse Neg Hx   . Early death Neg Hx   . Hearing loss Neg Hx   . Heart disease Neg Hx   . Hyperlipidemia Neg Hx   . Kidney disease Neg Hx   . Learning disabilities Neg Hx   . Mental illness Neg Hx   . Mental retardation Neg Hx   . Miscarriages / Stillbirths Neg Hx   . Stroke Neg Hx   . Vision loss Neg Hx   . Varicose Veins Neg Hx     Social History:  reports that she has been smoking.  She has never used smokeless tobacco. She reports  that she drinks about 1.2 oz of alcohol per week. She reports that she has current or past drug history. Drugs: Cocaine and "Crack" cocaine.  Additional Social History:  Alcohol / Drug Use Pain Medications: pt denies Prescriptions: pt denies Over the Counter: pt denies History of alcohol / drug use?: Yes Substance #1 Name of Substance 1: cocaine 1 - Frequency: "barely" 1 - Duration: ongoing 1 - Last Use / Amount: yesterday Substance #2 Name of Substance 2: alcohol 2 - Amount (size/oz): 2 beers 2 - Frequency: daily 2 - Duration: ongoing 2 - Last Use / Amount: yesterday  CIWA: CIWA-Ar BP: (!) 134/91 Pulse Rate: 88 COWS:    Allergies:  Allergies  Allergen Reactions  . Latex Hives and Swelling    Patient states vaginal swelling occurs  if a latex condom was used.  . Haldol [Haloperidol Lactate] Swelling  . Risperdal [Risperidone] Swelling    Home Medications:  (Not in a hospital admission)  OB/GYN Status:  No LMP recorded. Patient has had an implant.  General Assessment Data Location of Assessment: [facility] ED TTS Assessment: In system Is this a Tele or Face-to-Face Assessment?: Tele Assessment Is this an Initial Assessment or a Re-assessment for this encounter?: Initial Assessment Marital status: Single Is patient pregnant?: No Pregnancy Status: No Living Arrangements: Other relatives Can pt return to current living arrangement?: Yes Admission Status: Voluntary Is patient capable of signing voluntary admission?: Yes Referral Source: Self/Family/Friend Insurance type: [place] Medicaid     Crisis Care Plan Living Arrangements: Other relatives Name of Psychiatrist: none Name of Therapist: none  Education Status Is patient currently in school?: No Is the patient employed, unemployed or receiving disability?: Receiving disability income  Risk to self with the past 6 months Suicidal Ideation: Yes-Currently Present Has patient been a risk to self within the past 6 months prior to admission? : Yes Suicidal Intent: No Has patient had any suicidal intent within the past 6 months prior to admission? : Yes Is patient at risk for suicide?: Yes Suicidal Plan?: No Has patient had any suicidal plan within the past 6 months prior to admission? : No Access to Means: Yes Previous Attempts/Gestures: Yes How many times?: ("plenty") Other Self Harm Risks: cutting, burning Triggers for Past Attempts: Unknown, Unpredictable Intentional Self Injurious Behavior: Cutting, Burning Comment - Self Injurious Behavior: pt reports Family Suicide History: Unknown Recent stressful life event(s): Other (Comment) Persecutory voices/beliefs?: Yes Depression: Yes Depression Symptoms: Feeling worthless/self pity, Fatigue Substance  abuse history and/or treatment for substance abuse?: Yes Suicide prevention information given to non-admitted patients: Not applicable  Risk to Others within the past 6 months Homicidal Ideation: No Does patient have any lifetime risk of violence toward others beyond the six months prior to admission? : No Thoughts of Harm to Others: No Current Homicidal Intent: No Current Homicidal Plan: No Access to Homicidal Means: No History of harm to others?: No Assessment of Violence: None Noted Does patient have access to weapons?: No Criminal Charges Pending?: No Does patient have a court date: No Is patient on probation?: No  Psychosis Hallucinations: Auditory, Visual, Tactile Delusions: None noted  Mental Status Report Appearance/Hygiene: Unremarkable Eye Contact: Fair Motor Activity: Unremarkable Speech: Soft Level of Consciousness: Drowsy Mood: Sad Affect: Appropriate to circumstance Anxiety Level: Minimal Thought Processes: Unable to Assess Judgement: Impaired Orientation: Person, Place, Time, Situation Obsessive Compulsive Thoughts/Behaviors: None  Cognitive Functioning Concentration: Normal Memory: Recent Impaired, Remote Impaired Is patient IDD: No Is patient DD?: No Insight: Poor Impulse Control:  Unable to Assess Appetite: Fair Have you had any weight changes? : No Change Sleep: Unable to Assess Vegetative Symptoms: None  ADLScreening [facility] Assessment Services) Patient's cognitive ability adequate to safely complete daily activities?: Yes Patient able to express need for assistance with ADLs?: Yes Independently performs ADLs?: Yes (appropriate for developmental age)  Prior Inpatient Therapy Prior Inpatient Therapy: Yes Prior Therapy Dates: last admission @ 2 months ago Prior Therapy Facilty/Provider(s): [place] in [place] Reason for Treatment: SI  Prior Outpatient Therapy Prior Outpatient Therapy: No Does patient have an ACCT team?: No Does patient have  Intensive In-House Services?  : No Does patient have [facility] services? : No Does patient have P4CC services?: No  ADL Screening (condition at time of admission) Patient's cognitive ability adequate to safely complete daily activities?: Yes Is the patient deaf or have difficulty hearing?: No Does the patient have difficulty seeing, even when wearing glasses/contacts?: No Does the patient have difficulty concentrating, remembering, or making decisions?: Yes Patient able to express need for assistance with ADLs?: Yes Does the patient have difficulty dressing or bathing?: No Independently performs ADLs?: Yes (appropriate for developmental age) Does the patient have difficulty walking or climbing stairs?: No Weakness of Legs: None Weakness of Arms/Hands: None  Home Assistive Devices/Equipment Home Assistive Devices/Equipment: None    Abuse/Neglect Assessment (Assessment to be complete while patient is alone) Abuse/Neglect Assessment Can Be Completed: Unable to assess, patient is non-responsive or altered mental status     Advance Directives (For Healthcare) Does Patient Have a Medical Advance Directive?: No Would patient like information on creating a medical advance directive?: No - Patient declined          Disposition: IP TREATMENT Disposition Initial Assessment Completed for this Encounter: Yes(consulted with Assunta FoundShuvon Rankin, NP)  This service was provided via telemedicine using a 2-way, interactive audio and Immunologistvideo technology.  Names of all persons participating in this telemedicine service and their role in this encounter.   Laddie AquasSamantha M Dung Prien 10/31/2017 11:25 AM

## 2017-10-31 NOTE — ED Notes (Signed)
Pt updated on plan of care [facility], pt stating her hair itches and she wants to shower. Pt told she can shower when a sitter arrives or when she is taken over to [facility].

## 2017-11-01 DIAGNOSIS — F419 Anxiety disorder, unspecified: Secondary | ICD-10-CM

## 2017-11-01 DIAGNOSIS — Z9114 Patient's other noncompliance with medication regimen: Secondary | ICD-10-CM

## 2017-11-01 DIAGNOSIS — F1420 Cocaine dependence, uncomplicated: Secondary | ICD-10-CM

## 2017-11-01 DIAGNOSIS — Z811 Family history of alcohol abuse and dependence: Secondary | ICD-10-CM

## 2017-11-01 DIAGNOSIS — F250 Schizoaffective disorder, bipolar type: Principal | ICD-10-CM

## 2017-11-01 DIAGNOSIS — F17210 Nicotine dependence, cigarettes, uncomplicated: Secondary | ICD-10-CM

## 2017-11-01 DIAGNOSIS — Z818 Family history of other mental and behavioral disorders: Secondary | ICD-10-CM

## 2017-11-01 DIAGNOSIS — G4700 Insomnia, unspecified: Secondary | ICD-10-CM

## 2017-11-01 DIAGNOSIS — R45851 Suicidal ideations: Secondary | ICD-10-CM

## 2017-11-01 DIAGNOSIS — R450 Nervousness: Secondary | ICD-10-CM

## 2017-11-01 MED ORDER — PSEUDOEPHEDRINE HCL 30 MG PO TABS
30.0000 mg | ORAL_TABLET | Freq: Three times a day (TID) | ORAL | Status: DC | PRN
  Administered 2017-11-01 – 2017-11-03 (×3): 30 mg via ORAL
  Filled 2017-11-01 (×4): qty 1

## 2017-11-01 MED ORDER — TRAZODONE HCL 50 MG PO TABS
50.0000 mg | ORAL_TABLET | Freq: Every evening | ORAL | Status: DC | PRN
  Administered 2017-11-02: 50 mg via ORAL
  Filled 2017-11-01: qty 1
  Filled 2017-11-01: qty 7

## 2017-11-01 MED ORDER — BENZTROPINE MESYLATE 1 MG PO TABS: 1.0000 mg | ORAL_TABLET | Freq: Two times a day (BID) | ORAL | Status: DC | PRN

## 2017-11-01 MED ORDER — ARIPIPRAZOLE 15 MG PO TABS
15.0000 mg | ORAL_TABLET | Freq: Every day | ORAL | Status: DC
  Administered 2017-11-01 – 2017-11-02 (×2): 15 mg via ORAL
  Filled 2017-11-01 (×4): qty 1

## 2017-11-01 MED ORDER — MIRTAZAPINE 15 MG PO TABS
15.0000 mg | ORAL_TABLET | Freq: Every day | ORAL | Status: DC
  Administered 2017-11-01 – 2017-11-02 (×2): 15 mg via ORAL
  Filled 2017-11-01 (×3): qty 1
  Filled 2017-11-01: qty 21
  Filled 2017-11-01: qty 1

## 2017-11-01 NOTE — Progress Notes (Signed)
Recreation Therapy Notes  Date: 4.10.19 Time: 10:00 a.m.  Location: [address] Dayroom    Group Topic: Goal Setting, Positivity    Goal Area(s) Addresses:  Goal 1.1: Patients will identify different skills they use to create a positive life style  Patient will identify at least one goal for a positive lifestyle   Patient will identify the importance of a positive lifestyle   Patient will participate in Recreation Therapy group tx.    Intervention: Arts and Crafts    Activity: For the first part of the activity, patients were asked to try to balance a plastic egg on a flat surface. Patients realized that the egg was not be able to stand on its own without any support. Recreation Therapy Intern then passed out a cup of salt to the patients and ask them to try the task again. Having the patient resort to using salt illustrates that many things are possible but may require some outside-the-box thinking to achieve, just like with goals. After processing with patients, they had the opportunity to decorate their egg using the paint provided.    Education: Goal Setting, Positivity    Education Outcome: Acknowledges Education   Clinical Observations/Feedback: Patient did not attend    Sheryle HailDarian Starnisha Batrez, Recreation Therapy Intern   Sheryle HailDarian Xiomara Sevillano 11/01/2017 11:30 AM

## 2017-11-01 NOTE — H&P (Addendum)
Psychiatric Admission Assessment Adult  Patient Identification: [name] MRN:  [id number]  Date of Evaluation:  11/01/2017  Chief Complaint: Suicidal ideations with plan to hurt herself.   Principal Diagnosis: Schizoaffective disorder, bipolar type ([place])  Diagnosis:   Patient Active Problem List   Diagnosis Date Noted  . Cocaine use disorder, severe, dependence ([place]) [F14.20] 06/15/2015    Priority: High  . Schizoaffective disorder, bipolar type ([place]) [F25.0] 06/15/2015    Priority: Medium  . MDD (major depressive disorder), recurrent, severe, with psychosis ([place]) [F33.3] 10/31/2017  . Hyperprolactinemia ([place]) [E22.1] 06/17/2015  . Psychosis ([place]) [F29] 06/15/2015  . Alcohol use disorder, moderate, dependence ([place]) [F10.20] 06/15/2015  . PTSD (post-traumatic stress disorder) [F43.10] 06/15/2015  . Cannabis abuse [F12.10] 06/15/2015  . Rectal ulceration [K62.6] 07/08/2014  . Rectal bleeding [K62.5] 07/05/2014  . Rectal bleed [K62.5] 07/05/2014   History of Present Illness: This is an admission assessment for this 27 year old AA female with hx of chronic mental illness, multiple inpatient psychiatric hospitalization, drug/alcohol use & noncompliance to medication regimen. She is well known in this hospital from previous hospitalization for mood stabilization treatments. [name] is currently being admitted to the [facility] from the [facility] with complaints of suicidal ideations with plans to cut her wrist. Her UDS was positive for cocaine with BAL of 22. She was admitted for mood stabilization treatments.  During this assessment, [name] reports, "I went to the [facility] ED 2 days ago. I went there to get my psych med straight. Also, my body has broken out in rashes, I wanted it checked out too. I took my last psych medicine a month ago. My medicaid got cut off & I did not have the money to refill them. I used to be on Zyprexa 5 mg, Depakote 500 mg bid, Cogentin  0.5 mg bid, Abilify 5 mg & Remeron 15 mg. After I stopped my medicines, I started acting crazy, I cut on my wrist  to kill myself that way. This happened last week. I have been living in [place] x 2 weeks, staying with my a friend. 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  Associated Signs/Symptoms: Depression Symptoms:  depressed mood, insomnia, psychomotor agitation, anxiety,  (Hypo) Manic Symptoms:  Hallucinations,  Anxiety Symptoms:  Excessive Worry,  Psychotic Symptoms:  Hallucinations: Auditory Tactile Visual  PTSD Symptoms: NA  Total Time spent with patient: 1 hour  Past Psychiatric History: Bipolar disorder  Is the patient at risk to self? No.  Has the patient been a risk to self in the past 6 months? Yes.    Has the patient been a risk to self within the distant past? Yes.    Is the patient a risk to others? No.  Has the patient been a risk to others in the past 6 months? No.  Has the patient been a risk to others within the distant past? No.   Prior Inpatient Therapy: Yes, multiple inpatient psychiatric hospitalizations in [place].  Prior Outpatient Therapy: Yes  Alcohol Screening: 1. How often do you have a drink containing alcohol?: 4 or more times a week 2. How many drinks containing alcohol do you have on a typical day when you are drinking?: 1 or 2 3. How often do you have six or more drinks on one occasion?: Never AUDIT-C Score: 4 4. How often during the last year have you found that you were not able to stop drinking once  you had started?: Never 5. How often during the last year have you failed to do what was normally expected from you becasue of drinking?: Never 6. How often during the last year have you needed a first drink in the morning to get yourself going after a heavy drinking session?: Never 7. How often during the last year have you had a feeling of guilt of remorse after drinking?: Never 8. How often  during the last year have you been unable to remember what happened the night before because you had been drinking?: Never 9. Have you or someone else been injured as a result of your drinking?: No 10. Has a relative or friend or a doctor or another health worker been concerned about your drinking or suggested you cut down?: No Alcohol Use Disorder Identification Test Final Score (AUDIT): 4 Intervention/Follow-up: AUDIT Score <7 follow-up not indicated  Substance Abuse History in the last 12 months:  Yes.    Consequences of Substance Abuse: Medical Consequences:  Liver damage, Possible death by overdose Legal Consequences:  Arrests, jail time, Loss of driving privilege. Family Consequences:  Family discord, divorce and or separation.  Previous Psychotropic Medications: Yes   Psychological Evaluations: No   Past Medical History:  Past Medical History:  Diagnosis Date  . ADHD (attention deficit hyperactivity disorder)   . Anxiety   . Anxiety disorder   . Constipation   . Depression   . Mental disorder   . Personality disorder ([place])   . Substance abuse [facility])     Past Surgical History:  Procedure Laterality Date  . CESAREAN SECTION    . FLEXIBLE SIGMOIDOSCOPY N/A 07/06/2014   Procedure: FLEXIBLE SIGMOIDOSCOPY;  Surgeon: Cleotis Nipper, MD;  Location: [facility] ENDOSCOPY;  Service: Endoscopy;  Laterality: N/A;  . HERNIA REPAIR     Family History:  Family History  Problem Relation Age of Onset  . Schizophrenia Mother   . Alcoholism Brother   . Hypertension Maternal Grandfather   . Diabetes Maternal Grandfather   . Alcohol abuse Neg Hx   . Arthritis Neg Hx   . Asthma Neg Hx   . Birth defects Neg Hx   . Cancer Neg Hx   . COPD Neg Hx   . Depression Neg Hx   . Drug abuse Neg Hx   . Early death Neg Hx   . Hearing loss Neg Hx   . Heart disease Neg Hx   . Hyperlipidemia Neg Hx   . Kidney disease Neg Hx   . Learning disabilities Neg Hx   . Mental illness Neg Hx   . Mental  retardation Neg Hx   . Miscarriages / Stillbirths Neg Hx   . Stroke Neg Hx   . Vision loss Neg Hx   . Varicose Veins Neg Hx    Family Psychiatric  History: Schizophrenia : Mother  Tobacco Screening: Have you used any form of tobacco in the last 30 days? (Cigarettes, Smokeless Tobacco, Cigars, and/or Pipes): Yes Tobacco use, Select all that apply: 5 or more cigarettes per day Are you interested in Tobacco Cessation Medications?: No, patient refused Counseled patient on smoking cessation including recognizing danger situations, developing coping skills and basic information about quitting provided: Refused/Declined practical counseling  Social History:  Social History   Substance and Sexual Activity  Alcohol Use Yes  . Alcohol/week: 1.2 oz  . Types: 2 Cans of beer per week   Comment: UTA     Social History   Substance and Sexual Activity  Drug Use Yes  . Types: Cocaine, "Crack" cocaine    Additional Social History:  Allergies:   Allergies  Allergen Reactions  . Latex Hives and Swelling    Patient states vaginal swelling occurs if a latex condom was used.  . Haldol [Haloperidol Lactate] Swelling  . Risperdal [Risperidone] Swelling   Lab Results:  Results for orders placed or performed during the hospital encounter of 10/31/17 (from the past 48 hour(s))  Comprehensive metabolic panel     Status: Abnormal   Collection Time: 10/31/17  2:23 AM  Result Value Ref Range   Sodium 141 135 - 145 mmol/L   Potassium 3.4 (L) 3.5 - 5.1 mmol/L   Chloride 111 101 - 111 mmol/L   CO2 20 (L) 22 - 32 mmol/L   Glucose, Bld 86 65 - 99 mg/dL   BUN 10 6 - 20 mg/dL   Creatinine, Ser 0.57 0.44 - 1.00 mg/dL   Calcium 8.6 (L) 8.9 - 10.3 mg/dL   Total Protein 7.4 6.5 - 8.1 g/dL   Albumin 3.8 3.5 - 5.0 g/dL   AST 80 (H) 15 - 41 U/L   ALT 67 (H) 14 - 54 U/L   Alkaline Phosphatase 122 38 - 126 U/L   Total Bilirubin 0.4 0.3 - 1.2 mg/dL   GFR calc non Af Amer >60 >60 mL/min   GFR calc Af Amer  >60 >60 mL/min    Comment: (NOTE) The eGFR has been calculated using the CKD EPI equation. This calculation has not been validated in all clinical situations. eGFR's persistently <60 mL/min signify possible Chronic Kidney Disease.    Anion gap 10 5 - 15    Comment: Performed at [facility] [address], [place], [place] [address]  cbc     Status: Abnormal   Collection Time: 10/31/17  2:23 AM  Result Value Ref Range   WBC 4.6 4.0 - 10.5 K/uL   RBC 3.98 3.87 - 5.11 MIL/uL   Hemoglobin 11.2 (L) 12.0 - 15.0 g/dL   HCT 34.4 (L) 36.0 - 46.0 %   MCV 86.4 78.0 - 100.0 fL   MCH 28.1 26.0 - 34.0 pg   MCHC 32.6 30.0 - 36.0 g/dL   RDW 14.6 11.5 - 15.5 %   Platelets 277 150 - 400 K/uL    Comment: Performed at [facility], [place] [address], [place], [place] [address]  Ethanol     Status: Abnormal   Collection Time: 10/31/17  2:24 AM  Result Value Ref Range   Alcohol, Ethyl (B) 22 (H) <10 mg/dL    Comment:        LOWEST DETECTABLE LIMIT FOR SERUM ALCOHOL IS 10 mg/dL FOR MEDICAL PURPOSES ONLY Performed at [facility], [place] [address] [place], [place] [address]   Salicylate level     Status: None   Collection Time: 10/31/17  2:24 AM  Result Value Ref Range   Salicylate Lvl <0.2 2.8 - 30.0 mg/dL    Comment: Performed at [facility] [address], [place], [place] [address]  Acetaminophen level     Status: Abnormal   Collection Time: 10/31/17  2:24 AM  Result Value Ref Range   Acetaminophen (Tylenol), Serum <10 (L) 10 - 30 ug/mL    Comment:        THERAPEUTIC CONCENTRATIONS VARY SIGNIFICANTLY. A RANGE OF 10-30 ug/mL MAY BE AN EFFECTIVE CONCENTRATION FOR MANY PATIENTS. HOWEVER, SOME ARE BEST TREATED AT CONCENTRATIONS OUTSIDE THIS RANGE.  ACETAMINOPHEN CONCENTRATIONS >150 ug/mL AT 4 HOURS AFTER INGESTION AND >50 ug/mL AT 12 HOURS AFTER INGESTION ARE OFTEN ASSOCIATED WITH TOXIC REACTIONS. Performed at [facility], [place] [address] [place],  [place] [address]   Rapid urine drug screen (hospital performed)     Status: Abnormal   Collection Time: 10/31/17  2:35 AM  Result Value Ref Range   Opiates NONE DETECTED NONE DETECTED   Cocaine POSITIVE (A) NONE DETECTED   Benzodiazepines NONE DETECTED NONE DETECTED   Amphetamines NONE DETECTED NONE DETECTED   Tetrahydrocannabinol NONE DETECTED NONE DETECTED   Barbiturates NONE DETECTED NONE DETECTED    Comment: (NOTE) DRUG SCREEN FOR MEDICAL PURPOSES ONLY.  IF CONFIRMATION IS NEEDED FOR ANY PURPOSE, NOTIFY LAB WITHIN 5 DAYS. LOWEST DETECTABLE LIMITS FOR URINE DRUG SCREEN Drug Class                     Cutoff (ng/mL) Amphetamine and metabolites    1000 Barbiturate and metabolites    200 Benzodiazepine                 563 Tricyclics and metabolites     300 Opiates and metabolites        300 Cocaine and metabolites        300 THC                            50 Performed at [facility], [place] [address] [place],  [address]   I-Stat beta hCG blood, ED     Status: None   Collection Time: 10/31/17  2:37 AM  Result Value Ref Range   I-stat hCG, quantitative <5.0 <5 mIU/mL   Comment 3            Comment:   GEST. AGE      CONC.  (mIU/mL)   <=1 WEEK        5 - 50     2 WEEKS       50 - 500     3 WEEKS       100 - 10,000     4 WEEKS     1,000 - 30,000        FEMALE AND NON-PREGNANT FEMALE:     LESS THAN 5 mIU/mL    Blood Alcohol level:  Lab Results  Component Value Date   ETH 22 (H) 10/31/2017   ETH <5 33/29/5188   Metabolic Disorder Labs:  Lab Results  Component Value Date   HGBA1C 5.1 08/07/2014   MPG 100 08/07/2014   Lab Results  Component Value Date   PROLACTIN 33.2 (H) 06/16/2015   Lab Results  Component Value Date   CHOL 151 08/07/2014   TRIG 99 08/07/2014   HDL 81 08/07/2014   CHOLHDL 1.9 08/07/2014   VLDL 20 08/07/2014   LDLCALC 50 08/07/2014   Current Medications: Current Facility-Administered Medications  Medication Dose Route Frequency Provider  Last Rate Last Dose  . alum & mag hydroxide-simeth (MAALOX/MYLANTA) 200-200-20 MG/5ML suspension 30 mL  30 mL Oral Q4H PRN Rankin, Shuvon B, NP      . ARIPiprazole (ABILIFY) tablet 15 mg  15 mg Oral Daily Nwoko, Agnes I, NP      . benztropine (COGENTIN) tablet 1 mg  1 mg Oral BID PRN Pennelope Bracken, MD      . hydrOXYzine (ATARAX/VISTARIL) tablet 50 mg  50 mg Oral Q6H PRN Rankin,  Shuvon B, NP   50 mg at 10/31/17 2004  . magnesium hydroxide (MILK OF MAGNESIA) suspension 30 mL  30 mL Oral Daily PRN Rankin, Shuvon B, NP      . mirtazapine (REMERON) tablet 15 mg  15 mg Oral QHS Nwoko, Agnes I, NP      . nicotine (NICODERM CQ - dosed in mg/24 hours) patch 21 mg  21 mg Transdermal Q0600 Rankin, Shuvon B, NP      . traZODone (DESYREL) tablet 50 mg  50 mg Oral QHS PRN Lindell Spar I, NP       PTA Medications: Medications Prior to Admission  Medication Sig Dispense Refill Last Dose  . acetaminophen (TYLENOL) 500 MG tablet Take 1,000 mg by mouth every 6 (six) hours as needed for mild pain.    prn  . ARIPiprazole (ABILIFY) 15 MG tablet Take 0.5 tablets (7.5 mg total) by mouth at bedtime. (Patient not taking: Reported on 07/13/2015) 22 tablet 0   . ARIPiprazole 400 MG SUSR Inject 400 mg into the muscle every 30 (thirty) days. (Patient not taking: Reported on 07/13/2015) 1 each 0   . benztropine (COGENTIN) 0.5 MG tablet Take 1 tablet (0.5 mg total) by mouth at bedtime. (Patient not taking: Reported on 07/13/2015) 30 tablet 0   . citalopram (CELEXA) 10 MG tablet Take 1 tablet (10 mg total) by mouth daily. (Patient not taking: Reported on 07/13/2015) 30 tablet 0   . divalproex (DEPAKOTE) 250 MG DR tablet Take 1 tablet (250 mg total) by mouth daily before breakfast. Take 1 tablet (250 mg) by mouth at 7:00 am, Take 2 tablets ('500mg'$ ) by mouth at bedtime 0900pm (Patient not taking: Reported on 07/13/2015) 60 tablet 0   . etonogestrel (NEXPLANON) 68 MG IMPL implant 1 each (68 mg total) by Subdermal route  once. 02/2013 1 each 0 02/2013  . ferrous sulfate 325 (65 FE) MG tablet Take 1 tablet (325 mg total) by mouth 2 (two) times daily with a meal. (Patient not taking: Reported on 06/15/2015) 60 tablet 3 Not Taking at Unknown time  . fluticasone (FLONASE) 50 MCG/ACT nasal spray Place 1 spray into both nostrils daily. (Patient not taking: Reported on 07/13/2015) 1 g 0   . hydrOXYzine (ATARAX/VISTARIL) 50 MG tablet Take 1 tablet (50 mg total) by mouth every 6 (six) hours as needed for anxiety. (Patient not taking: Reported on 07/13/2015) 10 tablet 0   . loratadine (CLARITIN) 10 MG tablet Take 1 tablet (10 mg total) by mouth daily. (Patient not taking: Reported on 07/13/2015) 30 tablet 0   . nicotine (NICODERM CQ - DOSED IN MG/24 HOURS) 21 mg/24hr patch Place 1 patch (21 mg total) onto the skin daily at 6 (six) AM. (Patient not taking: Reported on 07/13/2015) 28 patch 0   . oxymetazoline (AFRIN) 0.05 % nasal spray Place 1 spray into both nostrils daily as needed for congestion.   prn  . prazosin (MINIPRESS) 1 MG capsule Take 1 capsule (1 mg total) by mouth at bedtime. (Patient not taking: Reported on 07/13/2015) 30 capsule 0   . traZODone (DESYREL) 150 MG tablet Take 1 tablet (150 mg total) by mouth at bedtime. (Patient not taking: Reported on 07/13/2015) 30 tablet 0    Musculoskeletal: Strength & Muscle Tone: within normal limits Gait & Station: normal Patient leans: N/A  Psychiatric Specialty Exam: Physical Exam  Constitutional: She is oriented to person, place, and time. She appears well-developed.  HENT:  Head: Normocephalic.  Eyes: Pupils are equal, round,  and reactive to light.  Neck: Normal range of motion.  Cardiovascular: Normal rate.  Respiratory: Effort normal.  GI: Soft.  Genitourinary:  Genitourinary Comments: Deferred  Musculoskeletal: Normal range of motion.  Neurological: She is alert and oriented to person, place, and time.  Skin: Skin is warm and dry.    Review of Systems   Constitutional: Negative.   HENT: Positive for congestion.   Eyes: Negative.   Respiratory: Negative for cough, shortness of breath and wheezing.   Cardiovascular: Negative.  Negative for palpitations, orthopnea and leg swelling.  Gastrointestinal: Negative for abdominal pain, diarrhea, nausea and vomiting.  Genitourinary: Negative.   Musculoskeletal: Negative.   Skin: Positive for itching and rash.  Neurological: Negative for dizziness and headaches.  Endo/Heme/Allergies: Negative.   Psychiatric/Behavioral: Positive for depression (Rates depression #10), hallucinations (AVH, feels like bugs crawling on skin) and substance abuse (UDS (+) for Cocaine, BAL 22). Negative for memory loss and suicidal ideas. The patient is nervous/anxious and has insomnia.     Blood pressure 126/90, pulse 87, temperature 98.4 F (36.9 C), temperature source Oral, resp. rate 18, height '5\' 5"'$  (1.651 m), weight 73 kg (161 lb), SpO2 100 %.Body mass index is 26.79 kg/m.  General Appearance: Casual in a hospital scrub, sractching, pacing, restless.  Eye Contact: Fair  Speech:  Clear and Coherent and Slow  Volume:  Decreased  Mood:  Anxious and Depressed  Affect:  Constricted and Depressed  Thought Process:  Coherent, Goal Directed and Descriptions of Associations: Intact  Orientation:  Full (Time, Place, and Person)  Thought Content:  Hallucinations: Auditory Tactile Visual  Suicidal Thoughts:  Currently denies any thoughts, plans or intent. Hx. self-mutilating behavior.  Homicidal Thoughts:  Denies  Memory:  Immediate;   Good Recent;   Good Remote;   Good  Judgement:  Fair  Insight:  Fair  Psychomotor Activity:  Restless, fidgity  Concentration:  Concentration: Fair and Attention Span: Poor  Recall:  [employer] of Knowledge:  Fair  Language:  Good  Akathisia:  Negative  Handed:  Right  AIMS (if indicated):     Assets:  Communication Skills Desire for Improvement Physical Health  ADL's:  Intact   Cognition:  WNL  Sleep:  Number of Hours: 6.75   Treatment Plan Summary: Daily contact with patient to assess and evaluate symptoms and progress in treatment: See Md's SRA & Treatment plan.  Observation Level/Precautions:  15 minute checks  Laboratory:  Per ED, BAL 22, UDS (+) for Cocaine  Psychotherapy: Group sesssions   Medications: See MAR  Consultations: As needed.    Discharge Concerns: Safety, Mood stability   Estimated LOS: 5-7 days  Other:  Admit to the [facility].   Physician Treatment Plan for Primary Diagnosis: Schizoaffective disorder, bipolar type ([place])  Long Term Goal(s): Improvement in symptoms so as ready for discharge  Short Term Goals: Ability to identify changes in lifestyle to reduce recurrence of condition will improve and Ability to demonstrate self-control will improve  Physician Treatment Plan for Secondary Diagnosis: Principal Problem:   Schizoaffective disorder, bipolar type ([place]) Active Problems:   Cocaine use disorder, severe, dependence ([place])  Long Term Goal(s): Improvement in symptoms so as ready for discharge  Short Term Goals: Ability to identify and develop effective coping behaviors will improve, Compliance with prescribed medications will improve and Ability to identify triggers associated with substance abuse/mental health issues will improve  I certify that inpatient services furnished can reasonably be expected to improve the patient's condition.  Lindell Spar, NP, PMHNP, FNP-BC 4/10/201912:48 PM   I have reviewed NP's Note, assessement, diagnosis and plan, and agree. I have also met with patient and completed suicide risk assessment.  [name] is a 27 y/o F with history of schizoaffective disorder bipolar type and polysubstance abuse who was admitted voluntarily from [facility] after she presented with worsening depression, AH, and substance use of cocaine.   Upon initial interview, pt shares, "I came in to get my meds straight. I  thought bugs were on me. I'm really trying to get off drugs. I've been clean from heroin and meth for about a month, but I've been using cocaine every day." Pt shares that she came to the [place] area from [place] about 2 weeks ago and has been staying with a friend. During that time she has been using cocaine nearly daily. She reports worsened depression symptoms of depressed mood, low energy, guilty feelings, initial insomnia, poor concentration, and fluctuant appetite. She had episode of self-injurious behavior by cutting on her forearm last week with intention of killing herself, but she denies any current SI/HI. She is able to contract for safety while in the hospital. She endorses AH of voices telling her that she has bugs crawling on her and she has some tactile hallucinations of feeling bugs on her skin. She denies VH. She denies symptoms of bipolar mania, OCD, and PTSD. She has been using cocaine and alcohol daily in a binge pattern for the past 2 weeks, and she denies all other recent illicit substance use.  Discussed with patient about treatment options. She reports that she was hospitalized about 2 weeks ago in [place], [place] at which time she was restarted on zyprexa and remeron, but she was unable to fill her outpatient prescriptions and she has not been taking any medications since that time. She was started on abilify prior to transfer to [facility], and she is in agreement to continue on Abilify oral form with plan to transition to long-acting injectable form if she has good efficacy and tolerability. She would like to be restarted on remeron at bedtime for mood and insomnia symptoms. She is in agreement to have referral to substance use treatment.   PLAN OF CARE:   -Admit to inpatient level of care  -Schizoaffective disorder, bipolar type             -Change abilify 7.'5mg'$  qhs to abilify '15mg'$  po qDay             - DC celexa              - Start remeron '15mg'$  po qhs  -Anxiety              - Continue atarax '50mg'$  po q6h prn anxiety  - Insomnia             - Change trazodone '150mg'$  qhs to trazodone '50mg'$  po qhs prn insomnia  - EPS             - Change cogentin 0.'5mg'$  qhs to cogentin '1mg'$  po BID prn EPS  -Encourage participation in groups and therapeutic milieu  -Disposition planning will be ongoing     Maris Berger, MD

## 2017-11-01 NOTE — Progress Notes (Signed)
Recreation Therapy Notes  INPATIENT RECREATION THERAPY ASSESSMENT  Patient Details Name: Olivia RoesStanlesha Santos MRN: [id number] DOB: 02/08/1991 Today's Date: 11/01/2017       Information Obtained From: Patient  Able to Participate in Assessment/Interview: Yes  Patient Presentation: Responsive  Reason for Admission (Per Patient): Suicidal Ideation, Suicide Attempt  Patient Stressors: Patient was not able to identify   Coping Skills:   Write, Music, Art  Leisure Interests (2+):  Music - Listen  Frequency of Recreation/Participation: Weekly  Awareness of Community Resources:  No  [place]   Patient Main Form of Transportation: Therapist, musicublic Transportation  Patient Strengths:  Writing   Patient Identified Areas of Improvement:  "To get medications straight"  Patient Goal for Hospitalization:  "To get on the right medications"  Current SI (including self-harm):  No  Current HI:  No  Current AVH: No  Staff Intervention Plan: Group Attendance, Collaborate with Interdisciplinary Treatment Team  Consent to Intern Participation: Yes  Sheryle Hailarian Maham Quintin, Recreation Therapy Intern   Sheryle HailDarian Crystol [place] 11/01/2017, 1:35 PM

## 2017-11-01 NOTE — [facility] Group Notes (Signed)
LCSW Group Therapy Note   11/01/2017 1:15pm   Type of Therapy and Topic:  Group Therapy:  Trust and Honesty  Participation Level:  Active  Description of Group:    In this group patients will be asked to explore the value of being honest.  Patients will be guided to discuss their thoughts, feelings, and behaviors related to honesty and trusting in others. Patients will process together how trust and honesty relate to forming relationships with peers, family members, and self. Each patient will be challenged to identify and express feelings of being vulnerable. Patients will discuss reasons why people are dishonest and identify alternative outcomes if one was truthful (to self or others). This group will be process-oriented, with patients participating in exploration of their own experiences, giving and receiving support, and processing challenge from other group members.   Therapeutic Goals: 1. Patient will identify why honesty is important to relationships and how honesty overall affects relationships.  2. Patient will identify a situation where they lied or were lied too and the  feelings, thought process, and behaviors surrounding the situation 3. Patient will identify the meaning of being vulnerable, how that feels, and how that correlates to being honest with self and others. 4. Patient will identify situations where they could have told the truth, but instead lied and explain reasons of dishonesty.   Summary of Patient Progress  "My goal is to get out of here and go into a substance abuse rehab program. I need to talk to a counselor here about getting into a place.  I was accepted at a place [facility] in [place] Unable to identify what she would differently if she felt like leaving the program.  "I've lost everything already.  I have to make a change.  Relapse is not an option."  Therapeutic Modalities:   Cognitive Behavioral Therapy Solution Focused Therapy Motivational  Interviewing Brief Therapy  Olivia RogueRodney B Delontae Lamm, LCSW 11/01/2017 12:28 PM

## 2017-11-01 NOTE — Progress Notes (Signed)
Patient ID: [name], female   DOB: 05/26/1991, 27 y.o.   MRN: [id number] DAR Note: Pt observed pacing up and down the hallway. Pt denied depression, anxiety or pain. Pt also denies SI/HI/AVH. Pt was however, be seen reacting to some internal stimuli with occasional smile and nodding "I think there are things crawling on me." All Pt questions and concerns addressed. Pt was med compliant. Will continue to monitor for safety.

## 2017-11-01 NOTE — Tx Team (Addendum)
Interdisciplinary Treatment and Diagnostic Plan Update  11/01/2017 Time of Session: 2:45 PM  [name] MRN: [id number]  Principal Diagnosis: Schizoaffective disorder, bipolar type (HCC)  Secondary Diagnoses: Principal Problem:   Schizoaffective disorder, bipolar type (HCC) Active Problems:   Cocaine use disorder, severe, dependence (HCC)   Current Medications:  Current Facility-Administered Medications  Medication Dose Route Frequency Provider Last Rate Last Dose  . alum & mag hydroxide-simeth (MAALOX/MYLANTA) 200-200-20 MG/5ML suspension 30 mL  30 mL Oral Q4H PRN Rankin, Shuvon B, NP      . ARIPiprazole (ABILIFY) tablet 15 mg  15 mg Oral Daily Nwoko, Agnes I, NP      . benztropine (COGENTIN) tablet 1 mg  1 mg Oral BID PRN Micheal Likens, MD      . hydrOXYzine (ATARAX/VISTARIL) tablet 50 mg  50 mg Oral Q6H PRN Rankin, Shuvon B, NP   50 mg at 10/31/17 2004  . magnesium hydroxide (MILK OF MAGNESIA) suspension 30 mL  30 mL Oral Daily PRN Rankin, Shuvon B, NP      . mirtazapine (REMERON) tablet 15 mg  15 mg Oral QHS Nwoko, Agnes I, NP      . nicotine (NICODERM CQ - dosed in mg/24 hours) patch 21 mg  21 mg Transdermal Q0600 Rankin, Shuvon B, NP      . pseudoephedrine (SUDAFED) tablet 30 mg  30 mg Oral Q8H PRN Nwoko, Agnes I, NP      . traZODone (DESYREL) tablet 50 mg  50 mg Oral QHS PRN Armandina Stammer I, NP        PTA Medications: Medications Prior to Admission  Medication Sig Dispense Refill Last Dose  . acetaminophen (TYLENOL) 500 MG tablet Take 1,000 mg by mouth every 6 (six) hours as needed for mild pain.    prn  . ARIPiprazole (ABILIFY) 15 MG tablet Take 0.5 tablets (7.5 mg total) by mouth at bedtime. (Patient not taking: Reported on 07/13/2015) 22 tablet 0   . ARIPiprazole 400 MG SUSR Inject 400 mg into the muscle every 30 (thirty) days. (Patient not taking: Reported on 07/13/2015) 1 each 0   . benztropine (COGENTIN) 0.5 MG tablet Take 1 tablet (0.5 mg total) by  mouth at bedtime. (Patient not taking: Reported on 07/13/2015) 30 tablet 0   . citalopram (CELEXA) 10 MG tablet Take 1 tablet (10 mg total) by mouth daily. (Patient not taking: Reported on 07/13/2015) 30 tablet 0   . divalproex (DEPAKOTE) 250 MG DR tablet Take 1 tablet (250 mg total) by mouth daily before breakfast. Take 1 tablet (250 mg) by mouth at 7:00 am, Take 2 tablets (500mg ) by mouth at bedtime 0900pm (Patient not taking: Reported on 07/13/2015) 60 tablet 0   . etonogestrel (NEXPLANON) 68 MG IMPL implant 1 each (68 mg total) by Subdermal route once. 02/2013 1 each 0 02/2013  . ferrous sulfate 325 (65 FE) MG tablet Take 1 tablet (325 mg total) by mouth 2 (two) times daily with a meal. (Patient not taking: Reported on 06/15/2015) 60 tablet 3 Not Taking at Unknown time  . fluticasone (FLONASE) 50 MCG/ACT nasal spray Place 1 spray into both nostrils daily. (Patient not taking: Reported on 07/13/2015) 1 g 0   . hydrOXYzine (ATARAX/VISTARIL) 50 MG tablet Take 1 tablet (50 mg total) by mouth every 6 (six) hours as needed for anxiety. (Patient not taking: Reported on 07/13/2015) 10 tablet 0   . loratadine (CLARITIN) 10 MG tablet Take 1 tablet (10 mg total) by mouth daily. (Patient  not taking: Reported on 07/13/2015) 30 tablet 0   . nicotine (NICODERM CQ - DOSED IN MG/24 HOURS) 21 mg/24hr patch Place 1 patch (21 mg total) onto the skin daily at 6 (six) AM. (Patient not taking: Reported on 07/13/2015) 28 patch 0   . oxymetazoline (AFRIN) 0.05 % nasal spray Place 1 spray into both nostrils daily as needed for congestion.   prn  . prazosin (MINIPRESS) 1 MG capsule Take 1 capsule (1 mg total) by mouth at bedtime. (Patient not taking: Reported on 07/13/2015) 30 capsule 0   . traZODone (DESYREL) 150 MG tablet Take 1 tablet (150 mg total) by mouth at bedtime. (Patient not taking: Reported on 07/13/2015) 30 tablet 0     Treatment Modalities: Medication Management, Group therapy, Case management,  1 to 1 session  with clinician, Psychoeducation, Recreational therapy.  Patient Stressors: Medication change or noncompliance Substance abuse  Patient Strengths: General fund of knowledge Physical Health   Physician Treatment Plan for Primary Diagnosis: Schizoaffective disorder, bipolar type (HCC) Long Term Goal(s): Improvement in symptoms so as ready for discharge  Short Term Goals: Ability to identify changes in lifestyle to reduce recurrence of condition will improve Ability to demonstrate self-control will improve Ability to identify and develop effective coping behaviors will improve Compliance with prescribed medications will improve Ability to identify triggers associated with substance abuse/mental health issues will improve  Medication Management: Evaluate patient's response, side effects, and tolerance of medication regimen.  Therapeutic Interventions: 1 to 1 sessions, Unit Group sessions and Medication administration.  Evaluation of Outcomes: Progressing  Physician Treatment Plan for Secondary Diagnosis: Principal Problem:   Schizoaffective disorder, bipolar type (HCC) Active Problems:   Cocaine use disorder, severe, dependence (HCC)  Long Term Goal(s): Improvement in symptoms so as ready for discharge  Short Term Goals: Ability to identify changes in lifestyle to reduce recurrence of condition will improve Ability to demonstrate self-control will improve Ability to identify and develop effective coping behaviors will improve Compliance with prescribed medications will improve Ability to identify triggers associated with substance abuse/mental health issues will improve  Medication Management: Evaluate patient's response, side effects, and tolerance of medication regimen.  Therapeutic Interventions: 1 to 1 sessions, Unit Group sessions and Medication administration.  Evaluation of Outcomes: Progressing   RN Treatment Plan for Primary Diagnosis: Schizoaffective disorder, bipolar  type (HCC) Long Term Goal(s): Knowledge of disease and therapeutic regimen to maintain health will improve  Short Term Goals: Ability to remain free from injury will improve, Ability to participate in decision making will improve, Ability to verbalize feelings will improve, Ability to disclose and discuss suicidal ideas, Ability to identify and develop effective coping behaviors will improve and Compliance with prescribed medications will improve  Medication Management: RN will administer medications as ordered by provider, will assess and evaluate patient's response and provide education to patient for prescribed medication. RN will report any adverse and/or side effects to prescribing provider.  Therapeutic Interventions: 1 on 1 counseling sessions, Psychoeducation, Medication administration, Evaluate responses to treatment, Monitor vital signs and CBGs as ordered, Perform/monitor CIWA, COWS, AIMS and Fall Risk screenings as ordered, Perform wound care treatments as ordered.  Evaluation of Outcomes: Progressing   LCSW Treatment Plan for Primary Diagnosis: Schizoaffective disorder, bipolar type (HCC) Long Term Goal(s): Safe transition to appropriate next level of care at discharge, Engage patient in therapeutic group addressing interpersonal concerns.  Short Term Goals: Engage patient in aftercare planning with referrals and resources, Facilitate acceptance of mental health diagnosis and concerns,  Facilitate patient progression through stages of change regarding substance use diagnoses and concerns, Identify triggers associated with mental health/substance abuse issues and Increase skills for wellness and recovery  Therapeutic Interventions: Assess for all discharge needs, 1 to 1 time with Social worker, Explore available resources and support systems, Assess for adequacy in community support network, Educate family and significant other(s) on suicide prevention, Complete Psychosocial Assessment,  Interpersonal group therapy.  Evaluation of Outcomes: Progressing   Progress in Treatment: Attending groups: Yes Participating in groups: Yes Taking medication as prescribed: Yes Toleration of medication: Yes, no side effects reported at this time Family/Significant other contact made: Yes, Chaya Jan [phone] (Friend) Patient understands diagnosis: Yes AEB asking for inpatient treatment  Discussing patient identified problems/goals with staff: Yes Medical problems stabilized or resolved: Yes Denies suicidal/homicidal ideation: Yes Issues/concerns per patient self-inventory: None Other: N/A  New problem(s) identified: None identified at this time.   New Short Term/Long Term Goal(s): "I would like to get into a treatment facility for drug use".   Discharge Plan or Barriers:  Upon discharge pt will return home with her friend and will follow up at [facility] for possible inpatient rehabilitation.  Reason for Continuation of Hospitalization: Depression Medication stabilization Suicidal ideation   Estimated Length of Stay: 11/06/17  Attendees: Patient: [name]  11/01/2017  2:45 PM  Physician: Jolyne Loa, MD 11/01/2017  2:45 PM  Nursing: Estella Husk, RN 11/01/2017  2:45 PM  RN Care Manager: Onnie Boer, RN 11/01/2017  2:45 PM  Social Worker: Richelle Ito, LCSW; Melba Coon, Social Work Intern 11/01/2017  2:45 PM  Recreational Therapist: Caroll Rancher, LRT 11/01/2017  2:45 PM  Other: Tomasita Morrow, P4CC 11/01/2017  2:45 PM  Other:  11/01/2017  2:45 PM  Other: 11/01/2017  2:45 PM    Scribe for Treatment Team: Aram Beecham, Student-Social Work 11/01/2017 2:45 PM

## 2017-11-01 NOTE — [facility] Suicide Risk Assessment (Signed)
[facility] INPATIENT:  Family/Significant Other Suicide Prevention Education  Suicide Prevention Education:  Education Completed; [name] [phone] (Friend) has been identified by the patient as the family member/significant other with whom the patient will be residing, and identified as the person(s) who will aid the patient in the event of a mental health crisis (suicidal ideations/suicide attempt).  With written consent from the patient, the family member/significant other has been provided the following suicide prevention education, prior to the and/or following the discharge of the patient.  The suicide prevention education provided includes the following:  Suicide risk factors  Suicide prevention and interventions  National Suicide Hotline telephone number  [facility] assessment telephone number  [facility] Emergency Assistance 911  [facility] and/or Residential Mobile Crisis Unit telephone number  Request made of family/significant other to:  Remove weapons (e.g., guns, rifles, knives), all items previously/currently identified as safety concern.    Remove drugs/medications (over-the-counter, prescriptions, illicit drugs), all items previously/currently identified as a safety concern.  The family member/significant other verbalizes understanding of the suicide prevention education information provided.  The family member/significant other agrees to remove the items of safety concern listed above.  [name] states that she does not think [name]'s substance use is an issue and that sometimes she just gets in a depressed mood that is brought on by her parents bringing her down.  She feels that [name] needs her medication because this type of episode only occurs when she is off her medication.  She reports that they have known each other since they were children.  She reports no suicidal thoughts or attempts.   [name] 11/01/2017, 2:58  PM

## 2017-11-01 NOTE — Progress Notes (Signed)
Pt is on unit.  Pt is cooperative, pleasant, a bit flat and is med compliant.  Pt denies SI, HI and AVH.  Pt verbally contracts for safety.  Pt attends group and participates. Pt denies any pain or discomfort. Pt remains safe on unit with q 15 min checks.
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
## 2017-11-02 LAB — RAPID URINE DRUG SCREEN, HOSP PERFORMED
AMPHETAMINES: NOT DETECTED
BENZODIAZEPINES: NOT DETECTED
Barbiturates: NOT DETECTED
Cocaine: NOT DETECTED
Opiates: NOT DETECTED
TETRAHYDROCANNABINOL: NOT DETECTED

## 2017-11-02 MED ORDER — NICOTINE POLACRILEX 2 MG MT GUM
2.0000 mg | CHEWING_GUM | OROMUCOSAL | Status: DC | PRN
  Administered 2017-11-03: 2 mg via ORAL
  Filled 2017-11-02: qty 1

## 2017-11-02 MED ORDER — DIPHENHYDRAMINE HCL 50 MG/ML IJ SOLN
INTRAMUSCULAR | Status: AC
  Administered 2017-11-02: 50 mg via INTRAMUSCULAR
  Filled 2017-11-02: qty 1

## 2017-11-02 MED ORDER — DIPHENHYDRAMINE HCL 50 MG/ML IJ SOLN
50.0000 mg | Freq: Once | INTRAMUSCULAR | Status: AC
  Administered 2017-11-02: 50 mg via INTRAMUSCULAR
  Filled 2017-11-02: qty 1

## 2017-11-02 MED ORDER — BENZTROPINE MESYLATE 1 MG PO TABS
1.0000 mg | ORAL_TABLET | Freq: Two times a day (BID) | ORAL | Status: DC
  Administered 2017-11-02 – 2017-11-03 (×2): 1 mg via ORAL
  Filled 2017-11-02 (×2): qty 1
  Filled 2017-11-02: qty 42
  Filled 2017-11-02 (×3): qty 1
  Filled 2017-11-02: qty 42
  Filled 2017-11-02: qty 1

## 2017-11-02 MED ORDER — TRIAMCINOLONE ACETONIDE 0.1 % EX CREA
TOPICAL_CREAM | Freq: Two times a day (BID) | CUTANEOUS | Status: DC
  Administered 2017-11-03: 07:00:00 via TOPICAL
  Filled 2017-11-02: qty 15

## 2017-11-02 NOTE — Progress Notes (Signed)
Psychoeducational Group Note  Date:  11/02/2017 Time:  2038  Group Topic/Focus:  Wrap-Up Group:   The focus of this group is to help patients review their daily goal of treatment and discuss progress on daily workbooks.  Participation Level: Did Not Attend  Participation Quality:  Not Applicable  Affect:  Not Applicable  Cognitive:  Not Applicable  Insight:  Not Applicable  Engagement in Group: Not Applicable  Additional Comments: The patient did not attend group since she was in her bedroom.   Hazle CocaGOODMAN, Frieda Arnall S 11/02/2017, 8:38 PM

## 2017-11-02 NOTE — Progress Notes (Signed)
Patient ID: Olivia RoesStanlesha Santos, female   DOB: 12/01/1990, 27 y.o.   MRN: [id number]   D: Patient isolative to her room, but did come to the nursing station with encouragement to get medications. Pt with dull, flat affect endorsing depression. A: Encourage staff/peer interaction, medication compliance, and group participation. Administer medications as ordered, maintain Q 15 minute safety checks.R: Pt compliant with medications and but did not attend group session. Pt denies SI at this time and verbally contracts for safety. No signs/symptoms of distress noted.

## 2017-11-02 NOTE — Progress Notes (Signed)
Patient reports that she feels really hot and feels like something is crawling on the inside of her body.  Patient states that she feels hot.  Patient stated she felt like that before her tongue swell up and she is feeling it again now.

## 2017-11-02 NOTE — Progress Notes (Signed)
Patient denies SI, HI and AVH. Patient experienced a period of tongue swelling and difficulty swallowing.  Patient was given IM benadryl which effectively resolved the issue.  Patient's vital signs were found to be within normal limits. Patent currently denies SI, HI and AVH.   Assess patient for safety, offer medications as prescribed, engage patient in 1:1 staff talks.   Patient ablel to contract for safety continue to monitor as planned.

## 2017-11-02 NOTE — Progress Notes (Signed)
Patient attended wrap-up group and rated her day a 1 because she missed her daughters birthday. She stated her goal is to enter a drug rehab program upon discharge.

## 2017-11-02 NOTE — Progress Notes (Signed)
Recreation Therapy Notes  Date: 4.11.19 Time: 10:00 a.m.  Location: [address] Dayroom   Group Topic: Self-Awareness   Goal Area(s) Addresses:  Goal 1.1: To increase self-awareness   - Patient will identify the importance of self-awareness  - Patient will identify at least three things of how others view them  - Patient will identify at least three things of how they view themselves   Behavioral Response: Appropriate   Intervention: Craft   Activity: Patients were instructed to decorate the outside of their bag using the magazines provided to represent how other people see them and put things inside that represent how they really are on the inside. At the end of the session, patients shared their bags with one another.  Education: Chief Executive Officer Education  Clinical Observations/Feedback: Patient attended and participated appropriately during Recreation Therapy group treatment. Patient was able to identify at least three things of how others view them. Patient was able to identify at least three things of how they view themselves. Patient did not present bag in front of peers. Patient actively listened during opening and closing discussion. Patient adequately met Goal 1.1 (See Above).   [name], Recreation Therapy Intern   [name] 11/02/2017 12:49 PM

## 2017-11-02 NOTE — Progress Notes (Signed)
[facility] MD Progress Note  11/02/2017 2:43 PM [name]  MRN:  [id number]  Subjective: [name] reports, "I guess I feel some kinda of way. I feel sad. I called to talk to my daughter yesterday because it was her 23th birthday. There no answer. I feel tired too. I'm starting to feel okay so, so, may be. I'm still hearing the voices telling me that the bugs are on me. I have some rashes on my skin too. The groups are helping. I need a nicorette gum".  Shortly after the above follow-up care assessment, patient came back from eating lunch. She complained to the nurse about her tongue swollen. The RN reports the symptoms to the attending providers.  Assessment reveals patient's tongue appearing large, swollen & thick. Patient appears to have difficulty keeping or holding her tongue inside her mouth. Her speech some what presents distorted. Patient did not lose any consciousness. She was able to express herself. There were no SOB noted. She was given a dose of Benadryl 50 mg IM. Her symptoms were resolved with a 10-15 minutes time frame. Patient was started on Abilify yesterday. This was discontinued as patient is known to react adversely to other antipsychotic, haldol & Risperdal. Her Abilify is discontinued & added to her list of allergies. Patient appears to be in apparent distress at this time. She is being monitored closely by the staff.  [name] is a 27 y/o F with history of schizoaffective disorder bipolar type and polysubstance abuse who was admitted voluntarily from [facility] after she presented with worsening depression, AH, and substance use of cocaine. Upon initial interview, pt shares, "I came in to get my meds straight. I thought bugs were on me. I'm really trying to get off drugs. I've been clean from heroin and meth for about a month, but I've been using cocaine every day." Pt shares that she came to the [place] area from [place] about 2 weeks ago and has been staying with a friend.  During that time she has been using cocaine nearly daily.     Principal Problem: Schizoaffective disorder, bipolar type (HCC)  Diagnosis:   Patient Active Problem List   Diagnosis Date Noted  . Cocaine use disorder, severe, dependence (HCC) [F14.20] 06/15/2015    Priority: High  . Schizoaffective disorder, bipolar type (HCC) [F25.0] 06/15/2015    Priority: Medium  . MDD (major depressive disorder), recurrent, severe, with psychosis (HCC) [F33.3] 10/31/2017  . Hyperprolactinemia (HCC) [E22.1] 06/17/2015  . Psychosis (HCC) [F29] 06/15/2015  . Alcohol use disorder, moderate, dependence (HCC) [F10.20] 06/15/2015  . PTSD (post-traumatic stress disorder) [F43.10] 06/15/2015  . Cannabis abuse [F12.10] 06/15/2015  . Rectal ulceration [K62.6] 07/08/2014  . Rectal bleeding [K62.5] 07/05/2014  . Rectal bleed [K62.5] 07/05/2014   Total Time spent with patient: 30 minutes  Past Psychiatric History: See H&P.  Past Medical History:  Past Medical History:  Diagnosis Date  . ADHD (attention deficit hyperactivity disorder)   . Anxiety   . Anxiety disorder   . Constipation   . Depression   . Mental disorder   . Personality disorder (HCC)   . Substance abuse [facility])     Past Surgical History:  Procedure Laterality Date  . CESAREAN SECTION    . FLEXIBLE SIGMOIDOSCOPY N/A 07/06/2014   Procedure: FLEXIBLE SIGMOIDOSCOPY;  Surgeon: Florencia Reasons, MD;  Location: [facility] ENDOSCOPY;  Service: Endoscopy;  Laterality: N/A;  . HERNIA REPAIR     Family History:  Family History  Problem Relation Age of  Onset  . Schizophrenia Mother   . Alcoholism Brother   . Hypertension Maternal Grandfather   . Diabetes Maternal Grandfather   . Alcohol abuse Neg Hx   . Arthritis Neg Hx   . Asthma Neg Hx   . Birth defects Neg Hx   . Cancer Neg Hx   . COPD Neg Hx   . Depression Neg Hx   . Drug abuse Neg Hx   . Early death Neg Hx   . Hearing loss Neg Hx   . Heart disease Neg Hx   . Hyperlipidemia Neg Hx   .  Kidney disease Neg Hx   . Learning disabilities Neg Hx   . Mental illness Neg Hx   . Mental retardation Neg Hx   . Miscarriages / Stillbirths Neg Hx   . Stroke Neg Hx   . Vision loss Neg Hx   . Varicose Veins Neg Hx    Family Psychiatric  History: See H&P Social History:  Social History   Substance and Sexual Activity  Alcohol Use Yes  . Alcohol/week: 1.2 oz  . Types: 2 Cans of beer per week   Comment: UTA     Social History   Substance and Sexual Activity  Drug Use Yes  . Types: Cocaine, "Crack" cocaine    Social History   Socioeconomic History  . Marital status: Single    Spouse name: Not on file  . Number of children: Not on file  . Years of education: Not on file  . Highest education level: Not on file  Occupational History  . Not on file  Social Needs  . Financial resource strain: Not on file  . Food insecurity:    Worry: Not on file    Inability: Not on file  . Transportation needs:    Medical: Not on file    Non-medical: Not on file  Tobacco Use  . Smoking status: Current Every Day Smoker  . Smokeless tobacco: Never Used  Substance and Sexual Activity  . Alcohol use: Yes    Alcohol/week: 1.2 oz    Types: 2 Cans of beer per week    Comment: UTA  . Drug use: Yes    Types: Cocaine, "Crack" cocaine  . Sexual activity: Not Currently  Lifestyle  . Physical activity:    Days per week: Not on file    Minutes per session: Not on file  . Stress: Not on file  Relationships  . Social connections:    Talks on phone: Not on file    Gets together: Not on file    Attends religious service: Not on file    Active member of club or organization: Not on file    Attends meetings of clubs or organizations: Not on file    Relationship status: Not on file  Other Topics Concern  . Not on file  Social History Narrative  . Not on file   Additional Social History:   Sleep: Good  Appetite:  Good  Current Medications: Current Facility-Administered Medications   Medication Dose Route Frequency Provider Last Rate Last Dose  . alum & mag hydroxide-simeth (MAALOX/MYLANTA) 200-200-20 MG/5ML suspension 30 mL  30 mL Oral Q4H PRN Rankin, Shuvon B, NP      . benztropine (COGENTIN) tablet 1 mg  1 mg Oral BID PRN Micheal Likens, MD      . hydrOXYzine (ATARAX/VISTARIL) tablet 50 mg  50 mg Oral Q6H PRN Rankin, Shuvon B, NP   50 mg at 10/31/17  2004  . magnesium hydroxide (MILK OF MAGNESIA) suspension 30 mL  30 mL Oral Daily PRN Rankin, Shuvon B, NP      . mirtazapine (REMERON) tablet 15 mg  15 mg Oral QHS Serrina Minogue I, NP   15 mg at 11/01/17 2143  . nicotine (NICODERM CQ - dosed in mg/24 hours) patch 21 mg  21 mg Transdermal Q0600 Rankin, Shuvon B, NP      . pseudoephedrine (SUDAFED) tablet 30 mg  30 mg Oral Q8H PRN Armandina StammerNwoko, Taleyah Hillman I, NP   30 mg at 11/01/17 1719  . traZODone (DESYREL) tablet 50 mg  50 mg Oral QHS PRN Armandina StammerNwoko, Lariah Fleer I, NP       Lab Results: No results found for this or any previous visit (from the past 48 hour(s)).  Blood Alcohol level:  Lab Results  Component Value Date   ETH 22 (H) 10/31/2017   ETH <5 06/15/2015    Metabolic Disorder Labs: Lab Results  Component Value Date   HGBA1C 5.1 08/07/2014   MPG 100 08/07/2014   Lab Results  Component Value Date   PROLACTIN 33.2 (H) 06/16/2015   Lab Results  Component Value Date   CHOL 151 08/07/2014   TRIG 99 08/07/2014   HDL 81 08/07/2014   CHOLHDL 1.9 08/07/2014   VLDL 20 08/07/2014   LDLCALC 50 08/07/2014   Physical Findings: AIMS: Facial and Oral Movements Muscles of Facial Expression: None, normal Lips and Perioral Area: None, normal Jaw: None, normal Tongue: None, normal,Extremity Movements Upper (arms, wrists, hands, fingers): None, normal Lower (legs, knees, ankles, toes): None, normal, Trunk Movements Neck, shoulders, hips: None, normal, Overall Severity Severity of abnormal movements (highest score from questions above): None, normal Incapacitation due to  abnormal movements: None, normal Patient's awareness of abnormal movements (rate only patient's report): No Awareness, Dental Status Current problems with teeth and/or dentures?: No Does patient usually wear dentures?: No  CIWA:    COWS:     Musculoskeletal: Strength & Muscle Tone: within normal limits Gait & Station: normal Patient leans: N/A  Psychiatric Specialty Exam: Physical Exam  Nursing note and vitals reviewed.   Review of Systems  Psychiatric/Behavioral: Positive for depression, hallucinations (Tactile/auditory hallucinations.) and substance abuse (Hx. Cocaine & alcohol use disorder). Negative for memory loss and suicidal ideas. The patient is nervous/anxious. The patient does not have insomnia.     Blood pressure 134/89, pulse (!) 109, temperature 98.2 F (36.8 C), temperature source Oral, resp. rate 18, height 5\' 5"  (1.651 m), weight 73 kg (161 lb), SpO2 100 %.Body mass index is 26.79 kg/m.  General Appearance: Casual and Fairly Groomed  Eye Contact:  Good  Speech:  Clear and Coherent and Normal Rate  Volume:  Normal  Mood:  Anxious and Depressed  Affect:  Appropriate, Congruent and Flat  Thought Process:  Coherent, Goal Directed and Descriptions of Associations: Loose  Orientation:  Full (Time, Place, and Person)  Thought Content:  Delusions, Hallucinations: Auditory Tactile and Ideas of Reference:   Paranoia Delusions  Suicidal Thoughts:  No  Homicidal Thoughts:  No  Memory:  Immediate;   Fair Recent;   Fair Remote;   Fair  Judgement:  Poor  Insight:  Lacking  Psychomotor Activity:  Normal  Concentration:  Concentration: Fair  Recall:  Fis[employer]Fair  Fund of Knowledge:  Fair  Language:  Fair  Akathisia:  No  Handed:    AIMS (if indicated):     Assets:  Communication Skills Resilience Social Support  ADL's:  Intact  Cognition:  WNL  Sleep:  Number of Hours: 6.75     Treatment Plan Summary: Daily contact with patient to assess and evaluate symptoms and  progress in treatment.  - Continue inpatient hospitalization.  Schizoaffective disorder. - Discontinued Abilify due to possible adverse reactions. Will re-evaluate tomorrow 11-03-17. - Evaluate the need for a mood stabilizer.  Anxiety.-  - Continue Hydroxyzine 50 mg qid prn.  Depression. - Continue Mirtazapine 15 mg po Q hs.  - Discontinue Nicotine patch 21 mg. - Initiate Nicorette gum 2 mg for nicotine withdrawal symptoms.  Insomnia. - Continue Trazodone 50 mg po prn.  Skin rashes. - Initiate Triamcinolone cream bid topically.  Nasal congestion. - Continue Sudafed 30 mg prn tid prn.  - Patient to continue to participate in the group sessions.  - Discharge disposition ongoing.  Armandina Stammer, NP, PMHNP, FNP-BC. 11/02/2017, 2:43 PM

## 2017-11-02 NOTE — BHH Group Notes (Signed)
BHH LCSW Group Therapy 11/02/2017 1:15pm  Type of Therapy: Group Therapy- Feelings Around Discharge & Establishing a Supportive Framework  Participation Level:  Minimal  Description of Group:   What is a supportive framework? What does it look like feel like and how do I discern it from and unhealthy non-supportive network? Learn how to cope when supports are not helpful and don't support you. Discuss what to do when your family/friends are not supportive.  Summary of Patient Progress  Olivia CoopStanlesha came to group but came in late.  At the end of the group she shared that her 3 kids give her strength because someone else is raising them and she would like to have them back.  She states that her strength is in knowing that she can be a good mother to her children.   Therapeutic Modalities:   Cognitive Behavioral Therapy Person-Centered Therapy Motivational Interviewing   [name], Student-Social Work 11/02/2017 2:27 PM

## 2017-11-03 MED ORDER — DIVALPROEX SODIUM 250 MG PO DR TAB: DELAYED_RELEASE_TABLET | ORAL | 0 refills | Status: DC

## 2017-11-03 MED ORDER — NICOTINE POLACRILEX 2 MG MT GUM: 2.0000 mg | CHEWING_GUM | OROMUCOSAL | 0 refills | Status: DC | PRN

## 2017-11-03 MED ORDER — TRAZODONE HCL 150 MG PO TABS: 150.0000 mg | ORAL_TABLET | Freq: Every day | ORAL | 0 refills | Status: DC

## 2017-11-03 MED ORDER — PRAZOSIN HCL 1 MG PO CAPS: 1.0000 mg | ORAL_CAPSULE | Freq: Every day | ORAL | 0 refills | Status: DC

## 2017-11-03 MED ORDER — TRIAMCINOLONE ACETONIDE 0.1 % EX CREA: TOPICAL_CREAM | Freq: Two times a day (BID) | CUTANEOUS | 0 refills | Status: DC

## 2017-11-03 MED ORDER — MIRTAZAPINE 15 MG PO TABS: 15.0000 mg | ORAL_TABLET | Freq: Every day | ORAL | 0 refills | Status: DC

## 2017-11-03 MED ORDER — ARIPIPRAZOLE 10 MG PO TABS
10.0000 mg | ORAL_TABLET | Freq: Every day | ORAL | Status: DC
  Administered 2017-11-03: 10 mg via ORAL
  Filled 2017-11-03: qty 1
  Filled 2017-11-03: qty 21
  Filled 2017-11-03 (×2): qty 1

## 2017-11-03 MED ORDER — FERROUS SULFATE 325 (65 FE) MG PO TABS: 325.0000 mg | ORAL_TABLET | Freq: Two times a day (BID) | ORAL | 0 refills | Status: DC

## 2017-11-03 MED ORDER — FLUTICASONE PROPIONATE 50 MCG/ACT NA SUSP: 1.0000 | Freq: Every day | NASAL | 0 refills | Status: DC

## 2017-11-03 MED ORDER — ARIPIPRAZOLE 10 MG PO TABS: 10.0000 mg | ORAL_TABLET | Freq: Every day | ORAL | 0 refills | Status: DC

## 2017-11-03 MED ORDER — LORATADINE 10 MG PO TABS: 10.0000 mg | ORAL_TABLET | Freq: Every day | ORAL | 0 refills | Status: DC

## 2017-11-03 MED ORDER — BENZTROPINE MESYLATE 1 MG PO TABS: 1.0000 mg | ORAL_TABLET | Freq: Two times a day (BID) | ORAL | 0 refills | Status: DC | PRN

## 2017-11-03 MED ORDER — HYDROXYZINE HCL 50 MG PO TABS: 50.0000 mg | ORAL_TABLET | Freq: Four times a day (QID) | ORAL | 0 refills | Status: DC | PRN

## 2017-11-03 MED ORDER — ARIPIPRAZOLE ER 400 MG IM SUSR: 400.0000 mg | INTRAMUSCULAR | 0 refills | Status: DC

## 2017-11-03 MED ORDER — TRAZODONE HCL 50 MG PO TABS: 50.0000 mg | ORAL_TABLET | Freq: Every evening | ORAL | 0 refills | Status: DC | PRN

## 2017-11-03 MED ORDER — OXYMETAZOLINE HCL 0.05 % NA SOLN: 1.0000 | Freq: Every day | NASAL | 0 refills | Status: DC | PRN

## 2017-11-03 NOTE — Progress Notes (Signed)
  [facility] :  Will you be returning to the same living situation after discharge:  Yes,  Home At discharge, do you have transportation home?: Yes,  Friend  Do you have the ability to pay for your medications: Yes,  Insurance   Release of information consent forms completed and in the chart;  Patient's signature needed at discharge.  Patient to Follow up at: Follow-up Information    [facility] Follow up on 11/08/2017.   Why:  Wednesday at 8:15 for your hospital follow up appointment.  Bring along ID, hospital d/c paperwork and make sure to get your prescriptions filled there so you have a supply to [facility] Contact information: [address] [place] [address] [phone]        [facility] Follow up on 11/09/2017.   Why:  Thursday at 8AM for sharp for your screening for admission appointment.  You will need to bring [place] [facility] [place], proof that [place] [facility], and at least a 2 week supply of medication. Contact information: [address] [place] [address] [phone]           Next level of care provider has access to [facility] Link:no  Safety Planning and Suicide Prevention discussed: Yes,  Yes  Have you used any form of tobacco in the last 30 days? (Cigarettes, Smokeless Tobacco, Cigars, and/or Pipes): Yes  Has patient been referred to the Quitline?: Patient refused referral  Patient has been referred for addiction treatment: Pt. refused referral  Olivia BeechamAngel M Freda Santos, Student-Social Work 11/03/2017, 9:34 AM

## 2017-11-03 NOTE — [facility] Suicide Risk Assessment (Signed)
[facility] Discharge Suicide Risk Assessment   Principal Problem: Schizoaffective disorder, bipolar type [facility]) Discharge Diagnoses:  Patient Active Problem List   Diagnosis Date Noted  . MDD (major depressive disorder), recurrent, severe, with psychosis (HCC) [F33.3] 10/31/2017  . Hyperprolactinemia (HCC) [E22.1] 06/17/2015  . Psychosis (HCC) [F29] 06/15/2015  . Schizoaffective disorder, bipolar type (HCC) [F25.0] 06/15/2015  . Cocaine use disorder, severe, dependence (HCC) [F14.20] 06/15/2015  . Alcohol use disorder, moderate, dependence (HCC) [F10.20] 06/15/2015  . PTSD (post-traumatic stress disorder) [F43.10] 06/15/2015  . Cannabis abuse [F12.10] 06/15/2015  . Rectal ulceration [K62.6] 07/08/2014  . Rectal bleeding [K62.5] 07/05/2014  . Rectal bleed [K62.5] 07/05/2014    Total Time spent with patient: 30 minutes  Musculoskeletal: Strength & Muscle Tone: within normal limits Gait & Station: normal Patient leans: N/A  Psychiatric Specialty Exam: Review of Systems  Constitutional: Negative for chills and fever.  Respiratory: Negative for cough and shortness of breath.   Cardiovascular: Negative for chest pain.  Gastrointestinal: Negative for abdominal pain, heartburn, nausea and vomiting.  Psychiatric/Behavioral: Negative for depression, hallucinations and suicidal ideas. The patient is not nervous/anxious and does not have insomnia.     Blood pressure 134/89, pulse (!) 109, temperature 98.2 F (36.8 C), temperature source Oral, resp. rate 18, height 5\' 5"  (1.651 m), weight 73 kg (161 lb), SpO2 100 %.Body mass index is 26.79 kg/m.  General Appearance: Casual and Fairly Groomed  Patent attorney::  Good  Speech:  Clear and Coherent and Normal Rate  Volume:  Normal  Mood:  Euthymic  Affect:  Appropriate, Congruent and Flat  Thought Process:  Coherent and Goal Directed  Orientation:  Full (Time, Place, and Person)  Thought Content:  Logical  Suicidal Thoughts:  No  Homicidal  Thoughts:  No  Memory:  Immediate;   Fair Recent;   Fair Remote;   Fair  Judgement:  Fair  Insight:  Lacking  Psychomotor Activity:  Normal  Concentration:  Good  Recall:  [employer] of Knowledge:Fair  Language: Fair  Akathisia:  No  Handed:    AIMS (if indicated):     Assets:  Communication Skills Resilience Social Support  Sleep:  Number of Hours: 6  Cognition: WNL  ADL's:  Intact   Mental Status Per Nursing Assessment::   On Admission:  NA  Demographic Factors:  Adolescent or young adult, Low socioeconomic status and Unemployed  Loss Factors: Financial problems/change in socioeconomic status  Historical Factors: Family history of mental illness or substance abuse and Impulsivity  Risk Reduction Factors:   Positive social support, Positive therapeutic relationship and Positive coping skills or problem solving skills  Continued Clinical Symptoms:  Severe Anxiety and/or Agitation Bipolar Disorder:   Mixed State Alcohol/Substance Abuse/Dependencies Schizophrenia:   Paranoid or undifferentiated type More than one psychiatric diagnosis Unstable or Poor Therapeutic Relationship  Cognitive Features That Contribute To Risk:  None    Suicide Risk:  Minimal: No identifiable suicidal ideation.  Patients presenting with no risk factors but with morbid ruminations; may be classified as minimal risk based on the severity of the depressive symptoms  Follow-up Information    [facility] Follow up on 11/08/2017.   Why:  Wednesday at 8:15 for your hospital follow up appointment.  Bring along ID, hospital d/c paperwork and make sure to get your prescriptions filled there so you have a supply to take with you to [facility] information: [address] [place] [address] [phone]        [facility] Follow up on 11/09/2017.   Why:  Thursday at 8AM for sharp for your screening for admission appointment.  You will need to [place], proof  that [place] [facility] [place], and at least a 2 week supply of medication. Contact information: [place] [address] [place] [id number] [phone]         Subjective Data:  Olivia RoesStanlesha Santos is a 27 y/o F with history of schizoaffective disorder bipolar type and polysubstance abuse who was [facility] voluntarily from [facility] after she presented with worsening depression, AH, tactile hallucinations, and substance use of cocaine. She was started on abilify and remeron. She worked with SW team in regards to arranging [facility]. She reported incremental improvement of her presenting symptoms.  Today upon evaluation, pt states, "I'm feeling better today. I was feeling bugs on me yesterday, but that's gone now." Pt denies SI/HI/AH/VH. She has been sleeping well. Her appetite is good. She denies physical complaints today. Yesterday afternoon patient had possible reaction to medication of swelling of her tongue which was responsive to IM benadryl. Pt explains that she had similar reactions in the past to olanzapine, risperdal, and haldol - typically when re-initializing an antipsychotic medication. She notes that cogentin has been helpful for her in eliminating and preventing that symptom. Pt acknowledges the risks of continuing abilify, including that she may have additional reaction, and she confirms that she would like to continue taking abilify as she finds it is helpful for her mood and psychotic symptoms. Pt was started on cogentin 1mg  BID yesterday, and she notes it has been helpful in preventing any recurrence of her oral symptoms yesterday. Pt will have abilify restarted today at lower dose of Abilify 10mg . In regards to aftercare, pt requests to discharge today, and her plan is to obtain a [place] identification card so that she can [place], and then she [facility] a [facility]in for  substance use treatment. She is in agreement to continue her current regimen without changes, and if she has good efficacy and ongoing tolerability of abilify oral form she will discuss with her outpatient treatment team [facility] about transitioning to long-acting injectable form. She was able to engage in safety planning including plan to [facility] or contact emergency services if she feels unable to maintain her own safety or the safety of others. Pt had no further questions, comments, or concerns.   Plan Of Care/Follow-up recommendations:   -Discharge to outpatient level of care  -Schizoaffective disorder, bipolar type             -Restart abilify 10mg  po qDay             -Continue remeron 15mg  po qhs  -EPS   - Continue cogentin 1mg  po BID  -Anxiety             - Continue atarax 50mg  po q6h prn anxiety  - Insomnia             - Continue trazodone 50mg  po qhs prn insomnia  Activity:  as tolerated Diet:  normal Tests:  NA Other:  see above for DC plan  Micheal Likenshristopher T Aswad Wandrey, MD 11/03/2017, 9:46 AM

## 2017-11-03 NOTE — Progress Notes (Addendum)
Recreation Therapy Notes  Date: 4.12.19 Time: 10 a.m. Location: [address] Dayroom   Group Topic: Stress Management, Forgiveness    Goal Area(s) Addresses:  Goal 1.1: To reduce stress  -Patient will identify how forgiveness can be beneficial  -Patient will identify the importance of stress management  -Patient will participate during Recreation Therapy group tx.    Behavioral Response: Engaged   Intervention: Stress Management   Activity: Meditation- Patients were in a peaceful environment with soft lighting enhancing patients mood. Patients listened to a forgiveness meditation on the calm app to help decrease stress levels   Education: Stress Management, Discharge Planning.    Education Outcome: Acknowledges edcuation/In group clarification offered/Needs additional education   Clinical Observations/Feedback:: Patient attended and participated appropriately during Recreation Therapy group treatment. Patient actively listened during opening and closing discussion. Patient met Goal 1.1 (See Above)    Ranell Patrick, Recreation Therapy Intern   Ranell Patrick 11/03/2017 11:11 AM

## 2017-11-03 NOTE — Plan of Care (Signed)
4.12.19. Pt. Engaged in groups with a calm and appropriate mood at least 2x within 5 RT group sessions

## 2017-11-03 NOTE — Progress Notes (Signed)
Recreation Therapy Notes  INPATIENT RECREATION TR PLAN  Patient Details Name: [name] MRN: [id number] DOB: Aug 19, 1990 Today's Date: 11/03/2017  Rec Therapy Plan Is patient appropriate for Therapeutic Recreation?: Yes Treatment times per week: At least three  Estimated Length of Stay: 5-7 days  TR Treatment/Interventions: Group participation (Approrpiate participation in Recreation Therapy group tx.)  Discharge Criteria Pt will be discharged from therapy if:: Discharged Treatment plan/goals/alternatives discussed and agreed upon by:: Patient/family  Discharge Summary Short term goals set: See Care Plan  Short term goals met: Complete Progress toward goals comments: Groups attended Which groups?: Stress management, Self-Awareness  Therapeutic equipment acquired: Noane  Reason patient discharged from therapy: Discharge from hospital Pt/family agrees with progress & goals achieved: Yes Date patient discharged from therapy: 11/03/17  [name], Recreation Therapy Intern   [name] 11/03/2017, 2:57 PM

## 2017-11-03 NOTE — Progress Notes (Addendum)
Pt is prepared for dc as she completes her daily assessment and on this she wrote shedeneid SI today and she rated her depression, hopelessness and anxeity " 4/0/4", respectively. DC instructions are given to her, they were discusssed and she was allowed to ask quesitons and demonstrated understanding and then she is gvien cc of paperwork ( AVS, SSP, SRA and transiton record). She is escorted to bldg entrance and dc'd per poc.

## 2017-11-03 NOTE — Discharge Summary (Addendum)
Physician Discharge Summary Note  Patient:  [name] is an 27 y.o., female MRN:  [id number] DOB:  11/27/1990 Patient phone:  [phone] (home)  Patient address:   [address] [place] [id number],  Total Time spent with patient: Greater than 30 minutes  Date of Admission:  10/31/2017 Date of Discharge: 11-03-17  Reason for Admission: Worsening depression, AH, tactile hallucinations, and substance use of cocaine.   Principal Problem: Schizoaffective disorder, bipolar type [facility](HCC) Discharge Diagnoses: Patient Active Problem List   Diagnosis Date Noted  . Cocaine use disorder, severe, dependence (HCC) [F14.20] 06/15/2015    Priority: High  . Schizoaffective disorder, bipolar type (HCC) [F25.0] 06/15/2015    Priority: Medium  . MDD (major depressive disorder), recurrent, severe, with psychosis (HCC) [F33.3] 10/31/2017  . Hyperprolactinemia (HCC) [E22.1] 06/17/2015  . Psychosis (HCC) [F29] 06/15/2015  . Alcohol use disorder, moderate, dependence (HCC) [F10.20] 06/15/2015  . PTSD (post-traumatic stress disorder) [F43.10] 06/15/2015  . Cannabis abuse [F12.10] 06/15/2015  . Rectal ulceration [K62.6] 07/08/2014  . Rectal bleeding [K62.5] 07/05/2014  . Rectal bleed [K62.5] 07/05/2014   Past Psychiatric History: Schizoaffective disorder  Past Medical History:  Past Medical History:  Diagnosis Date  . ADHD (attention deficit hyperactivity disorder)   . Anxiety   . Anxiety disorder   . Constipation   . Depression   . Mental disorder   . Personality disorder (HCC)   . Substance abuse [facility](HCC)     Past Surgical History:  Procedure Laterality Date  . CESAREAN SECTION    . FLEXIBLE SIGMOIDOSCOPY N/A 07/06/2014   Procedure: FLEXIBLE SIGMOIDOSCOPY;  Surgeon: Florencia Reasonsobert Buccini V, MD;  [facility];  Service: Endoscopy;  Laterality: N/A;  . HERNIA REPAIR     Family History:  Family History  Problem Relation Age of Onset  . Schizophrenia Mother   . Alcoholism Brother    . Hypertension Maternal Grandfather   . Diabetes Maternal Grandfather   . Alcohol abuse Neg Hx   . Arthritis Neg Hx   . Asthma Neg Hx   . Birth defects Neg Hx   . Cancer Neg Hx   . COPD Neg Hx   . Depression Neg Hx   . Drug abuse Neg Hx   . Early death Neg Hx   . Hearing loss Neg Hx   . Heart disease Neg Hx   . Hyperlipidemia Neg Hx   . Kidney disease Neg Hx   . Learning disabilities Neg Hx   . Mental illness Neg Hx   . Mental retardation Neg Hx   . Miscarriages / Stillbirths Neg Hx   . Stroke Neg Hx   . Vision loss Neg Hx   . Varicose Veins Neg Hx    Family Psychiatric  History: See H&P Social History:  Social History   Substance and Sexual Activity  Alcohol Use Yes  . Alcohol/week: 1.2 oz  . Types: 2 Cans of beer per week   Comment: UTA     Social History   Substance and Sexual Activity  Drug Use Yes  . Types: Cocaine, "Crack" cocaine    Social History   Socioeconomic History  . Marital status: Single    Spouse name: Not on file  . Number of children: Not on file  . Years of education: Not on file  . Highest education level: Not on file  Occupational History  . Not on file  Social Needs  . Financial resource strain: Not on file  . Food insecurity:  Worry: Not on file    Inability: Not on file  . Transportation needs:    Medical: Not on file    Non-medical: Not on file  Tobacco Use  . Smoking status: Current Every Day Smoker  . Smokeless tobacco: Never Used  Substance and Sexual Activity  . Alcohol use: Yes    Alcohol/week: 1.2 oz    Types: 2 Cans of beer per week    Comment: UTA  . Drug use: Yes    Types: Cocaine, "Crack" cocaine  . Sexual activity: Not Currently  Lifestyle  . Physical activity:    Days per week: Not on file    Minutes per session: Not on file  . Stress: Not on file  Relationships  . Social connections:    Talks on phone: Not on file    Gets together: Not on file    Attends religious service: Not on file    Active  member of club or organization: Not on file    Attends meetings of clubs or organizations: Not on file    Relationship status: Not on file  Other Topics Concern  . Not on file  Social History Narrative  . Not on file   Hospital Course: (Per Md's discharge SRA):  [name] is a 27 y/o F with history of schizoaffective disorder bipolar type and polysubstance abuse who was [facility] from [facility] after she presented with worsening depression, AH, tactile hallucinations, and substance use of cocaine. She was started on abilify and remeron. She worked with SW team in regards to arranging for substance use treatment at [facility] next week. She reported incremental improvement of her presenting symptoms.  Besides the use of Abilify 10 mg for mood control & Remeron 15 mg for depression/insomnia, Stanilesha also received & was discharged on; Cogentin 1 mg for EPS, Hydroxyzine 50 mg prn for anxiety, Nicorette gum 2 mg for smoking cessation & Trazodone 50 mg for insomnia. She was enrolled & participated in the group counseling sessions being offered & held on this unit, she learned coping skills. Other than some skin rashes of which she received some Triamcinolone cream application, she tolerated her treatment regimen without any adverse effects or reactions reported.  Today upon her discharge evaluation, pt states, "I'm feeling better today. I was feeling bugs on me yesterday, but that's gone now." Pt denies SI/HI/AH/VH. She has been sleeping well. Her appetite is good. She denies physical complaints today. Yesterday afternoon patient had possible reaction to medication of swelling of her tongue which was responsive to IM benadryl. Pt explains that she had similar reactions in the past to olanzapine, risperdal, and haldol - typically when re-initializing an antipsychotic medication. She notes that cogentin has been helpful for her in eliminating and preventing that symptom. Pt acknowledges the risks  of continuing abilify, including that she may have additional reaction, and she confirms that she would like to continue taking abilify as she finds it is helpful for her mood and psychotic symptoms. Pt was started on cogentin 1mg  BID yesterday, and she notes it has been helpful in preventing any recurrence of her oral symptoms yesterday. Pt will have abilify restarted today at lower dose of Abilify 10mg . In regards to aftercare, pt requests to discharge today, and her plan is to obtain a [employer] identification card so that she can switch her [place] from [place] to [place], and then she plans to go to [facility] next week as a walk-in for substance use treatment. She is in  agreement to continue her current regimen without changes, and if she has good efficacy and ongoing tolerability of abilify oral form she will discuss with her outpatient treatment team at [facility] about transitioning to long-acting injectable form. She was able to engage in safety planning including plan to return to [facility] or contact emergency services if she feels unable to maintain her own safety or the safety of others. Pt had no further questions, comments, or concerns.  Upon discharge, Dante Gang presents mentally & medically stable. She will continue mental health care on an outpatient basis as noted below. She received from the [facility] pharmacy, a 7 days worth, supply samples of her [facility] discharge medications. She left [facility] with all personal belongings in no apparent distress. Transportation per friend.  Physical Findings: AIMS: Facial and Oral Movements Muscles of Facial Expression: None, normal Lips and Perioral Area: None, normal Jaw: None, normal Tongue: None, normal,Extremity Movements Upper (arms, wrists, hands, fingers): None, normal Lower (legs, knees, ankles, toes): None, normal, Trunk Movements Neck, shoulders, hips: None, normal, Overall Severity Severity of abnormal movements (highest score from questions above): None,  normal Incapacitation due to abnormal movements: None, normal Patient's awareness of abnormal movements (rate only patient's report): No Awareness, Dental Status Current problems with teeth and/or dentures?: No Does patient usually wear dentures?: No  CIWA:    COWS:     Musculoskeletal: Strength & Muscle Tone: within normal limits Gait & Station: normal Patient leans: N/A  Psychiatric Specialty Exam: Physical Exam  Constitutional: She appears well-developed.  HENT:  Head: Normocephalic.  Eyes: Pupils are equal, round, and reactive to light.  Neck: Normal range of motion.  Cardiovascular: Normal rate.  Respiratory: Effort normal.  GI: Soft.  Genitourinary:  Genitourinary Comments: Deferred  Musculoskeletal: Normal range of motion.  Neurological: She is alert.  Skin: Skin is warm.    Review of Systems  Constitutional: Negative.   HENT: Negative.   Eyes: Negative.   Respiratory: Negative.   Cardiovascular: Negative.   Gastrointestinal: Negative.   Genitourinary: Negative.   Musculoskeletal: Negative.   Skin: Negative.   Neurological: Negative.   Endo/Heme/Allergies: Negative.   Psychiatric/Behavioral: Positive for depression (Stable), hallucinations (Hx. AVH & tactile hallucinations) and substance abuse (Hx. Alcohol & Cocaine use disorder). Negative for memory loss and suicidal ideas. The patient has insomnia (Stable). The patient is not nervous/anxious (Stable).     Blood pressure 134/89, pulse (!) 109, temperature 98.2 F (36.8 C), temperature source Oral, resp. rate 18, height 5\' 5"  (1.651 m), weight 73 kg (161 lb), SpO2 100 %.Body mass index is 26.79 kg/m.  See Md's SRA   Have you used any form of tobacco in the last 30 days? (Cigarettes, Smokeless Tobacco, Cigars, and/or Pipes): Yes  Has this patient used any form of tobacco in the last 30 days? (Cigarettes, Smokeless Tobacco, Cigars, and/or Pipes): Yes, an FDA-approved tobacco cessation medication was offered at  discharge.  Blood Alcohol level:  Lab Results  Component Value Date   ETH 22 (H) 10/31/2017   ETH <5 06/15/2015   Metabolic Disorder Labs:  Lab Results  Component Value Date   HGBA1C 5.1 08/07/2014   MPG 100 08/07/2014   Lab Results  Component Value Date   PROLACTIN 33.2 (H) 06/16/2015   Lab Results  Component Value Date   CHOL 151 08/07/2014   TRIG 99 08/07/2014   HDL 81 08/07/2014   CHOLHDL 1.9 08/07/2014   VLDL 20 08/07/2014   LDLCALC 50 08/07/2014   See Psychiatric Specialty  Exam and Suicide Risk Assessment completed by Attending Physician prior to discharge.  Discharge destination:  Home  Is patient on multiple antipsychotic therapies at discharge:  No   Has Patient had three or more failed trials of antipsychotic monotherapy by history:  No  Recommended Plan for Multiple Antipsychotic Therapies: NA   Allergies as of 11/03/2017      Reactions   Latex Hives, Swelling   Patient states vaginal swelling occurs if a latex condom was used.   Haldol [haloperidol Lactate] Swelling   Risperdal [risperidone] Swelling      Medication List    STOP taking these medications   acetaminophen 500 MG tablet Commonly known as:  TYLENOL   citalopram 10 MG tablet Commonly known as:  CELEXA   divalproex 250 MG DR tablet Commonly known as:  DEPAKOTE   etonogestrel 68 MG Impl implant Commonly known as:  NEXPLANON   ferrous sulfate 325 (65 FE) MG tablet   fluticasone 50 MCG/ACT nasal spray Commonly known as:  FLONASE   loratadine 10 MG tablet Commonly known as:  CLARITIN   nicotine 21 mg/24hr patch Commonly known as:  NICODERM CQ - dosed in mg/24 hours   oxymetazoline 0.05 % nasal spray Commonly known as:  AFRIN   prazosin 1 MG capsule Commonly known as:  MINIPRESS     TAKE these medications     Indication  ARIPiprazole 10 MG tablet Commonly known as:  ABILIFY Take 1 tablet (10 mg total) by mouth daily. For mood control What changed:    medication  strength  how much to take  when to take this  additional instructions  Another medication with the same name was removed. Continue taking this medication, and follow the directions you see here.  Indication:  Mood control   benztropine 1 MG tablet Commonly known as:  COGENTIN Take 1 tablet (1 mg total) by mouth 2 (two) times daily as needed (EPS). What changed:    medication strength  how much to take  when to take this  reasons to take this  Indication:  Extrapyramidal Reaction caused by Medications   hydrOXYzine 50 MG tablet Commonly known as:  ATARAX/VISTARIL Take 1 tablet (50 mg total) by mouth every 6 (six) hours as needed for anxiety.  Indication:  Feeling Anxious, anxiety   mirtazapine 15 MG tablet Commonly known as:  REMERON Take 1 tablet (15 mg total) by mouth at bedtime. For depression/sleep  Indication:  Major Depressive Disorder, Insomnia   nicotine polacrilex 2 MG gum Commonly known as:  NICORETTE Take 1 each (2 mg total) by mouth as needed for smoking cessation. (May buy from over the counter at the pharmacy): For smoking cessation  Indication:  Nicotine Addiction   traZODone 50 MG tablet Commonly known as:  DESYREL Take 1 tablet (50 mg total) by mouth at bedtime as needed for sleep. What changed:    medication strength  how much to take  when to take this  reasons to take this  Indication:  Trouble Sleeping   triamcinolone cream 0.1 % Commonly known as:  KENALOG Apply topically 2 (two) times daily. For rashes  Indication:  Atopic Dermatitis      Follow-up Information    [facility] Follow up on 11/08/2017.   Why:  Wednesday at 8:15 for your hospital follow up appointment.  Bring along ID, hospital d/c paperwork and make sure to get your prescriptions filled there so you have a supply to take with you to [facility] Contact information:  [address] [place] [address] [phone]        Services, [facility] Follow up on  11/09/2017.   Why:  Thursday at 8AM for sharp for your screening for admission appointment.  You will need to bring your [facility] ID, proof that you have applied for [facility] transfer to [facility], and at least a 2 week supply of medication. Contact information: [address] [place] [address] [phone]          Follow-up recommendations: Activity:  As tolerated Diet: As recommended by your primary care doctor. Keep all scheduled follow-up appointments as recommended.   Comments: Patient is instructed prior to discharge to: Take all medications as prescribed by his/her mental healthcare provider. Report any adverse effects and or reactions from the medicines to his/her outpatient provider promptly. Patient has been instructed & cautioned: To not engage in alcohol and or illegal drug use while on prescription medicines. In the event of worsening symptoms, patient is instructed to call the crisis hotline, 911 and or go to the nearest ED for appropriate evaluation and treatment of symptoms. To follow-up with his/her primary care provider for your other medical issues, concerns and or health care needs.   Signed: Armandina Stammer, NP, PMHNP, FNP-BC 11/03/2017, 9:50 AM   Patient seen, Suicide Assessment Completed.  Disposition Plan Reviewed

## 2018-04-26 ENCOUNTER — Inpatient Hospital Stay (HOSPITAL_COMMUNITY)
Admission: AD | Admit: 2018-04-26 | Discharge: 2018-04-30 | DRG: 885 | Disposition: A | Discharge status: Home / Self Care | Payer: Medicaid Other | Source: Intra-hospital | Attending: Psychiatry | Admitting: Psychiatry

## 2018-04-26 ENCOUNTER — Encounter (HOSPITAL_COMMUNITY): Payer: Self-pay

## 2018-04-26 ENCOUNTER — Encounter (HOSPITAL_COMMUNITY): Payer: Self-pay | Admitting: *Deleted

## 2018-04-26 ENCOUNTER — Emergency Department (HOSPITAL_COMMUNITY)
Admission: EM | Admit: 2018-04-26 | Discharge: 2018-04-26 | Disposition: A | Discharge status: Other Acute Inpatient Hospital | Payer: Medicaid - Out of State | Attending: Emergency Medicine | Admitting: Emergency Medicine

## 2018-04-26 ENCOUNTER — Other Ambulatory Visit: Payer: Self-pay

## 2018-04-26 ENCOUNTER — Emergency Department (HOSPITAL_COMMUNITY)
Admission: EM | Admit: 2018-04-26 | Discharge: 2018-04-26 | Discharge status: Absent without leave | Payer: Medicaid - Out of State

## 2018-04-26 ENCOUNTER — Encounter (HOSPITAL_COMMUNITY): Payer: Self-pay | Admitting: Emergency Medicine

## 2018-04-26 DIAGNOSIS — F250 Schizoaffective disorder, bipolar type: Principal | ICD-10-CM | POA: Diagnosis present

## 2018-04-26 DIAGNOSIS — G4700 Insomnia, unspecified: Secondary | ICD-10-CM | POA: Diagnosis present

## 2018-04-26 DIAGNOSIS — R443 Hallucinations, unspecified: Secondary | ICD-10-CM | POA: Diagnosis present

## 2018-04-26 DIAGNOSIS — F4310 Post-traumatic stress disorder, unspecified: Secondary | ICD-10-CM | POA: Diagnosis present

## 2018-04-26 DIAGNOSIS — Z79899 Other long term (current) drug therapy: Secondary | ICD-10-CM | POA: Insufficient documentation

## 2018-04-26 DIAGNOSIS — Z23 Encounter for immunization: Secondary | ICD-10-CM | POA: Diagnosis not present

## 2018-04-26 DIAGNOSIS — Z8249 Family history of ischemic heart disease and other diseases of the circulatory system: Secondary | ICD-10-CM | POA: Diagnosis not present

## 2018-04-26 DIAGNOSIS — Z91040 Latex allergy status: Secondary | ICD-10-CM | POA: Diagnosis not present

## 2018-04-26 DIAGNOSIS — Z915 Personal history of self-harm: Secondary | ICD-10-CM

## 2018-04-26 DIAGNOSIS — R45851 Suicidal ideations: Secondary | ICD-10-CM | POA: Diagnosis present

## 2018-04-26 DIAGNOSIS — Z888 Allergy status to other drugs, medicaments and biological substances status: Secondary | ICD-10-CM

## 2018-04-26 DIAGNOSIS — F909 Attention-deficit hyperactivity disorder, unspecified type: Secondary | ICD-10-CM | POA: Diagnosis present

## 2018-04-26 DIAGNOSIS — F1410 Cocaine abuse, uncomplicated: Secondary | ICD-10-CM | POA: Diagnosis not present

## 2018-04-26 DIAGNOSIS — F17210 Nicotine dependence, cigarettes, uncomplicated: Secondary | ICD-10-CM | POA: Diagnosis present

## 2018-04-26 DIAGNOSIS — Z818 Family history of other mental and behavioral disorders: Secondary | ICD-10-CM

## 2018-04-26 DIAGNOSIS — Z590 Homelessness: Secondary | ICD-10-CM | POA: Diagnosis not present

## 2018-04-26 DIAGNOSIS — Z885 Allergy status to narcotic agent status: Secondary | ICD-10-CM

## 2018-04-26 DIAGNOSIS — L209 Atopic dermatitis, unspecified: Secondary | ICD-10-CM | POA: Diagnosis present

## 2018-04-26 DIAGNOSIS — F1420 Cocaine dependence, uncomplicated: Secondary | ICD-10-CM | POA: Diagnosis present

## 2018-04-26 DIAGNOSIS — F1490 Cocaine use, unspecified, uncomplicated: Secondary | ICD-10-CM | POA: Diagnosis not present

## 2018-04-26 DIAGNOSIS — Z62810 Personal history of physical and sexual abuse in childhood: Secondary | ICD-10-CM | POA: Diagnosis present

## 2018-04-26 DIAGNOSIS — J301 Allergic rhinitis due to pollen: Secondary | ICD-10-CM | POA: Diagnosis present

## 2018-04-26 DIAGNOSIS — Z811 Family history of alcohol abuse and dependence: Secondary | ICD-10-CM | POA: Diagnosis not present

## 2018-04-26 DIAGNOSIS — F419 Anxiety disorder, unspecified: Secondary | ICD-10-CM | POA: Diagnosis not present

## 2018-04-26 DIAGNOSIS — Z7982 Long term (current) use of aspirin: Secondary | ICD-10-CM | POA: Diagnosis not present

## 2018-04-26 DIAGNOSIS — Z833 Family history of diabetes mellitus: Secondary | ICD-10-CM

## 2018-04-26 DIAGNOSIS — F1290 Cannabis use, unspecified, uncomplicated: Secondary | ICD-10-CM | POA: Diagnosis not present

## 2018-04-26 LAB — COMPREHENSIVE METABOLIC PANEL
ALT: 18 U/L (ref 0–44)
ANION GAP: 10 (ref 5–15)
AST: 24 U/L (ref 15–41)
Albumin: 4.2 g/dL (ref 3.5–5.0)
Alkaline Phosphatase: 107 U/L (ref 38–126)
BUN: 10 mg/dL (ref 6–20)
CHLORIDE: 107 mmol/L (ref 98–111)
CO2: 23 mmol/L (ref 22–32)
CREATININE: 0.87 mg/dL (ref 0.44–1.00)
Calcium: 9.3 mg/dL (ref 8.9–10.3)
GFR calc Af Amer: >60 mL/min (ref >60)
Glucose, Bld: 83 mg/dL (ref 70–99)
Potassium: 3.3 mmol/L — ABNORMAL LOW (ref 3.5–5.1)
SODIUM: 140 mmol/L (ref 135–145)
Total Bilirubin: 0.7 mg/dL (ref 0.3–1.2)
Total Protein: 8.3 g/dL — ABNORMAL HIGH (ref 6.5–8.1)

## 2018-04-26 LAB — CBC
HCT: 38.2 % (ref 36.0–46.0)
Hemoglobin: 12.5 g/dL (ref 12.0–15.0)
MCH: 29.3 pg (ref 26.0–34.0)
MCHC: 32.7 g/dL (ref 30.0–36.0)
MCV: 89.7 fL (ref 78.0–100.0)
Platelets: 365 10*3/uL (ref 150–400)
RBC: 4.26 MIL/uL (ref 3.87–5.11)
RDW: 13.6 % (ref 11.5–15.5)
WBC: 6.6 10*3/uL (ref 4.0–10.5)

## 2018-04-26 LAB — WET PREP, GENITAL
CLUE CELLS WET PREP: NONE SEEN
Sperm: NONE SEEN
Trich, Wet Prep: NONE SEEN
YEAST WET PREP: NONE SEEN

## 2018-04-26 LAB — RAPID URINE DRUG SCREEN, HOSP PERFORMED
AMPHETAMINES: NOT DETECTED
Barbiturates: NOT DETECTED
Benzodiazepines: POSITIVE — AB
Cocaine: POSITIVE — AB
OPIATES: NOT DETECTED
Tetrahydrocannabinol: NOT DETECTED

## 2018-04-26 LAB — I-STAT BETA HCG BLOOD, ED (MC, WL, AP ONLY): I-stat hCG, quantitative: <5 m[IU]/mL (ref <5)

## 2018-04-26 LAB — SALICYLATE LEVEL: Salicylate Lvl: <7 mg/dL (ref 2.8–30.0)

## 2018-04-26 LAB — ACETAMINOPHEN LEVEL: Acetaminophen (Tylenol), Serum: <10 ug/mL — ABNORMAL LOW (ref 10–30)

## 2018-04-26 LAB — ETHANOL: Alcohol, Ethyl (B): <10 mg/dL (ref <10)

## 2018-04-26 MED ORDER — STERILE WATER FOR INJECTION IJ SOLN
INTRAMUSCULAR | Status: AC
  Administered 2018-04-26: 10 mL
  Filled 2018-04-26: qty 10

## 2018-04-26 MED ORDER — MIRTAZAPINE 15 MG PO TABS
15.0000 mg | ORAL_TABLET | Freq: Every day | ORAL | Status: DC
  Administered 2018-04-27: 15 mg via ORAL
  Filled 2018-04-26 (×2): qty 1

## 2018-04-26 MED ORDER — HYDROXYZINE HCL 50 MG PO TABS
50.0000 mg | ORAL_TABLET | Freq: Four times a day (QID) | ORAL | Status: DC | PRN
  Administered 2018-04-26 – 2018-04-29 (×3): 50 mg via ORAL
  Filled 2018-04-26 (×4): qty 1

## 2018-04-26 MED ORDER — ALUM & MAG HYDROXIDE-SIMETH 200-200-20 MG/5ML PO SUSP: 30.0000 mL | ORAL | Status: DC | PRN

## 2018-04-26 MED ORDER — CEFTRIAXONE SODIUM 250 MG IJ SOLR
250.0000 mg | Freq: Once | INTRAMUSCULAR | Status: AC
  Administered 2018-04-26: 250 mg via INTRAMUSCULAR
  Filled 2018-04-26: qty 250

## 2018-04-26 MED ORDER — AZITHROMYCIN 250 MG PO TABS
1000.0000 mg | ORAL_TABLET | Freq: Once | ORAL | Status: AC
  Administered 2018-04-26: 1000 mg via ORAL
  Filled 2018-04-26: qty 4

## 2018-04-26 MED ORDER — TRAZODONE HCL 50 MG PO TABS
50.0000 mg | ORAL_TABLET | Freq: Every evening | ORAL | Status: DC | PRN
  Administered 2018-04-29 (×2): 50 mg via ORAL
  Filled 2018-04-26 (×2): qty 1

## 2018-04-26 MED ORDER — INFLUENZA VAC SPLIT QUAD 0.5 ML IM SUSY
0.5000 mL | PREFILLED_SYRINGE | INTRAMUSCULAR | Status: AC
  Administered 2018-04-27: 0.5 mL via INTRAMUSCULAR
  Filled 2018-04-26: qty 0.5

## 2018-04-26 MED ORDER — NICOTINE POLACRILEX 2 MG MT GUM
2.0000 mg | CHEWING_GUM | OROMUCOSAL | Status: DC | PRN
  Administered 2018-04-26 – 2018-04-29 (×4): 2 mg via ORAL

## 2018-04-26 MED ORDER — MAGNESIUM HYDROXIDE 400 MG/5ML PO SUSP: 30.0000 mL | Freq: Every day | ORAL | Status: DC | PRN

## 2018-04-26 MED ORDER — ARIPIPRAZOLE 10 MG PO TABS
10.0000 mg | ORAL_TABLET | Freq: Every day | ORAL | Status: DC
Start: 2018-04-26 — End: 2018-04-27
  Administered 2018-04-27: 10 mg via ORAL
  Filled 2018-04-26 (×3): qty 1

## 2018-04-26 MED ORDER — BENZTROPINE MESYLATE 1 MG PO TABS: 1.0000 mg | ORAL_TABLET | Freq: Two times a day (BID) | ORAL | Status: DC | PRN

## 2018-04-26 MED ORDER — ACETAMINOPHEN 325 MG PO TABS
650.0000 mg | ORAL_TABLET | Freq: Four times a day (QID) | ORAL | Status: DC | PRN
  Administered 2018-04-28: 650 mg via ORAL
  Filled 2018-04-26: qty 2
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
## 2018-04-26 NOTE — Tx Team (Signed)
Initial Treatment Plan 04/26/2018 4:54 PM [name] ZOX:[id number]    PATIENT STRESSORS: Medication change or noncompliance Substance abuse   PATIENT STRENGTHS: Ability for insight Motivation for treatment/growth   PATIENT IDENTIFIED PROBLEMS: Get back on medications  Find Alternative living arrangements                   DISCHARGE CRITERIA:  Ability to meet basic life and health needs Adequate post-discharge living arrangements Improved stabilization in mood, thinking, and/or behavior Withdrawal symptoms are absent or subacute and managed without 24-hour nursing intervention  PRELIMINARY DISCHARGE PLAN: Outpatient therapy Placement in alternative living arrangements  PATIENT/FAMILY INVOLVEMENT: This treatment plan has been presented to and reviewed with the patient, [name], and/or family member.  The patient and family have been given the opportunity to ask questions and make suggestions.  Sharin Mons, RN 04/26/2018, 4:54 PM
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
## 2018-04-26 NOTE — ED Notes (Signed)
Patient aware of the need for urine sample. Patient states she cannot give a sample at this time. Patient pacing in the room, turning out all the lights, and sitting in the corner on the floor.

## 2018-04-26 NOTE — ED Notes (Signed)
Bed: WA29 Expected date:  Expected time:  Means of arrival:  Comments: [name]

## 2018-04-26 NOTE — BH Assessment (Signed)
[facility] Assessment Progress Note  Per Juanetta Beets, DO, this pt requires psychiatric hospitalization at this time.  Malva Limes, RN, [facility] has assigned pt to [facility] Rm 501-1.  Pt has signed Voluntary Admission and Consent for Treatment, as well as Consent to Release Information to no one, and signed forms have been faxed to [facility]  Pt's nurse, Kendal Hymen, has been notified, and agrees to send original paperwork along with pt via Juel Burrow, and to call report to [phone].  Doylene Canning, [place] [facility] Coordinator [phone]

## 2018-04-26 NOTE — ED Notes (Signed)
Pt discharged safely with [place] driver.  Pt was calm and cooperative.  All belongings were sent with patient. 

## 2018-04-26 NOTE — BHH Group Notes (Signed)
Adult Psychoeducational Group Note  Date:  04/26/2018 Time:  8:41 PM  Group Topic/Focus:  Wrap-Up Group:   The focus of this group is to help patients review their daily goal of treatment and discuss progress on daily workbooks.  Participation Level:  Minimal  Participation Quality:  Appropriate  Affect:  Appropriate  Cognitive:  Alert and Appropriate  Insight: Lacking  Engagement in Group:  Engaged  Modes of Intervention:  Discussion and Education  Additional Comments:  Pt attended and participated in wrap up group this evening. Pt did not have much to share in group, being that it's their first day here at [facility]. Pt did share with Clinical research associate that they wanted to find a new environment to be in.   Chrisandra Netters 04/26/2018, 8:41 PM

## 2018-04-26 NOTE — ED Triage Notes (Signed)
Pt states she is depressed, anxious, and doesn't want to live anymore. Pt states she ran out of her normal psychiatric medications a couple of days ago.

## 2018-04-26 NOTE — ED Notes (Signed)
TTS at bedside. 

## 2018-04-27 DIAGNOSIS — F17210 Nicotine dependence, cigarettes, uncomplicated: Secondary | ICD-10-CM

## 2018-04-27 DIAGNOSIS — F4310 Post-traumatic stress disorder, unspecified: Secondary | ICD-10-CM

## 2018-04-27 DIAGNOSIS — R45851 Suicidal ideations: Secondary | ICD-10-CM

## 2018-04-27 DIAGNOSIS — G4700 Insomnia, unspecified: Secondary | ICD-10-CM

## 2018-04-27 DIAGNOSIS — F1410 Cocaine abuse, uncomplicated: Secondary | ICD-10-CM

## 2018-04-27 DIAGNOSIS — F419 Anxiety disorder, unspecified: Secondary | ICD-10-CM

## 2018-04-27 DIAGNOSIS — F250 Schizoaffective disorder, bipolar type: Principal | ICD-10-CM

## 2018-04-27 LAB — GC/CHLAMYDIA PROBE AMP (~~LOC~~) NOT AT ARMC
CHLAMYDIA, DNA PROBE: NEGATIVE
NEISSERIA GONORRHEA: NEGATIVE

## 2018-04-27 MED ORDER — QUETIAPINE FUMARATE 50 MG PO TABS
150.0000 mg | ORAL_TABLET | Freq: Every day | ORAL | Status: DC
  Administered 2018-04-27 – 2018-04-29 (×3): 150 mg via ORAL
  Filled 2018-04-27 (×5): qty 1

## 2018-04-27 MED ORDER — SERTRALINE HCL 50 MG PO TABS
50.0000 mg | ORAL_TABLET | Freq: Every day | ORAL | Status: DC
Start: 2018-04-27 — End: 2018-04-30
  Administered 2018-04-27 – 2018-04-30 (×4): 50 mg via ORAL
  Filled 2018-04-27 (×7): qty 1

## 2018-04-27 MED ORDER — PSEUDOEPHEDRINE HCL ER 120 MG PO TB12
120.0000 mg | ORAL_TABLET | Freq: Two times a day (BID) | ORAL | Status: DC | PRN
  Administered 2018-04-28: 120 mg via ORAL
  Filled 2018-04-27: qty 1
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
## 2018-04-27 NOTE — Tx Team (Addendum)
Interdisciplinary Treatment and Diagnostic Plan Update  04/30/2018 Time of Session: [place] MRN: [id number]  Principal Diagnosis: Schizoaffective disorder, bipolar type [facility])  Secondary Diagnoses: Principal Problem:   Schizoaffective disorder, bipolar type ([place]) Active Problems:   Cocaine use disorder ([place])   PTSD (post-traumatic stress disorder)   Current Medications:  Current Facility-Administered Medications  Medication Dose Route Frequency Provider Last Rate Last Dose  . acetaminophen (TYLENOL) tablet 650 mg  650 mg Oral Q6H PRN Ethelene Hal, NP   650 mg at 04/28/18 2012  . alum & mag hydroxide-simeth (MAALOX/MYLANTA) 200-200-20 MG/5ML suspension 30 mL  30 mL Oral Q4H PRN Ethelene Hal, NP      . benztropine (COGENTIN) tablet 1 mg  1 mg Oral BID PRN Ethelene Hal, NP      . hydrOXYzine (ATARAX/VISTARIL) tablet 50 mg  50 mg Oral Q6H PRN Ethelene Hal, NP   50 mg at 04/29/18 1810  . magnesium hydroxide (MILK OF MAGNESIA) suspension 30 mL  30 mL Oral Daily PRN Ethelene Hal, NP      . nicotine polacrilex (NICORETTE) gum 2 mg  2 mg Oral PRN Ethelene Hal, NP   2 mg at 04/29/18 1400  . pseudoephedrine (SUDAFED) 12 hr tablet 120 mg  120 mg Oral Q12H PRN Pennelope Bracken, MD   120 mg at 04/28/18 1813  . QUEtiapine (SEROQUEL) tablet 150 mg  150 mg Oral QHS Pennelope Bracken, MD   150 mg at 04/29/18 2105  . sertraline (ZOLOFT) tablet 50 mg  50 mg Oral Daily Pennelope Bracken, MD   50 mg at 04/29/18 0903  . traZODone (DESYREL) tablet 50 mg  50 mg Oral QHS PRN Ethelene Hal, NP   50 mg at 04/29/18 2105   PTA Medications: Medications Prior to Admission  Medication Sig Dispense Refill Last Dose  . ARIPiprazole (ABILIFY) 10 MG tablet Take 1 tablet (10 mg total) by mouth daily. For mood control 15 tablet 0 Over a month ago  . aspirin EC 81 MG tablet Take 243-405 mg by mouth daily as needed for moderate  pain.   04/25/2018 at Unknown time  . benztropine (COGENTIN) 1 MG tablet Take 1 tablet (1 mg total) by mouth 2 (two) times daily as needed (EPS). 60 tablet 0 Over a month ago  . fluticasone (FLONASE) 50 MCG/ACT nasal spray Place 2 sprays into both nostrils daily as needed for allergies or rhinitis.   Over a month ago  . hydrOXYzine (ATARAX/VISTARIL) 50 MG tablet Take 1 tablet (50 mg total) by mouth every 6 (six) hours as needed for anxiety. (Patient not taking: Reported on 04/26/2018) 60 tablet 0 Not Taking at Unknown time  . loratadine (CLARITIN) 10 MG tablet Take 10 mg by mouth daily as needed for allergies.   Over a month ago  . mirtazapine (REMERON) 15 MG tablet Take 1 tablet (15 mg total) by mouth at bedtime. For depression/sleep 30 tablet 0 Past Week at Unknown time  . nicotine polacrilex (NICORETTE) 2 MG gum Take 1 each (2 mg total) by mouth as needed for smoking cessation. (May buy from over the counter at the pharmacy): For smoking cessation 100 tablet 0 Past Month at Unknown time  . traZODone (DESYREL) 50 MG tablet Take 1 tablet (50 mg total) by mouth at bedtime as needed for sleep. 30 tablet 0 Over a month ago  . triamcinolone cream (KENALOG) 0.1 % Apply topically 2 (two) times daily. For rashes (  Patient not taking: Reported on 04/26/2018) 30 g 0 Not Taking at Unknown time    Patient Stressors: Medication change or noncompliance Substance abuse  Patient Strengths: Ability for insight Motivation for treatment/growth  Treatment Modalities: Medication Management, Group therapy, Case management,  1 to 1 session with clinician, Psychoeducation, Recreational therapy.   Physician Treatment Plan for Primary Diagnosis: Schizoaffective disorder, bipolar type ([place]) Long Term Goal(s): Improvement in symptoms so as ready for discharge Improvement in symptoms so as ready for discharge   Short Term Goals: Ability to demonstrate self-control will improve Ability to identify triggers associated with  substance abuse/mental health issues will improve  Medication Management: Evaluate patient's response, side effects, and tolerance of medication regimen.  Therapeutic Interventions: 1 to 1 sessions, Unit Group sessions and Medication administration.  Evaluation of Outcomes: Not Met  Physician Treatment Plan for Secondary Diagnosis: Principal Problem:   Schizoaffective disorder, bipolar type ([place]) Active Problems:   Cocaine use disorder ([place])   PTSD (post-traumatic stress disorder)  Long Term Goal(s): Improvement in symptoms so as ready for discharge Improvement in symptoms so as ready for discharge   Short Term Goals: Ability to demonstrate self-control will improve Ability to identify triggers associated with substance abuse/mental health issues will improve     Medication Management: Evaluate patient's response, side effects, and tolerance of medication regimen.  Therapeutic Interventions: 1 to 1 sessions, Unit Group sessions and Medication administration.  Evaluation of Outcomes: Not Met   RN Treatment Plan for Primary Diagnosis: Schizoaffective disorder, bipolar type ([place]) Long Term Goal(s): Knowledge of disease and therapeutic regimen to maintain health will improve  Short Term Goals: Ability to identify and develop effective coping behaviors will improve and Compliance with prescribed medications will improve  Medication Management: RN will administer medications as ordered by provider, will assess and evaluate patient's response and provide education to patient for prescribed medication. RN will report any adverse and/or side effects to prescribing provider.  Therapeutic Interventions: 1 on 1 counseling sessions, Psychoeducation, Medication administration, Evaluate responses to treatment, Monitor vital signs and CBGs as ordered, Perform/monitor CIWA, COWS, AIMS and Fall Risk screenings as ordered, Perform wound care treatments as ordered.  Evaluation of Outcomes: Not  Met   LCSW Treatment Plan for Primary Diagnosis: Schizoaffective disorder, bipolar type ([place]) Long Term Goal(s): Safe transition to appropriate next level of care at discharge, Engage patient in therapeutic group addressing interpersonal concerns.  Short Term Goals: Engage patient in aftercare planning with referrals and resources, Increase social support and Increase skills for wellness and recovery  Therapeutic Interventions: Assess for all discharge needs, 1 to 1 time with Social worker, Explore available resources and support systems, Assess for adequacy in community support network, Educate family and significant other(s) on suicide prevention, Complete Psychosocial Assessment, Interpersonal group therapy.  Evaluation of Outcomes: Not Met   Progress in Treatment: Attending groups: No. Participating in groups: No. Taking medication as prescribed: Yes. Toleration medication: Yes. Family/Significant other contact made: No, will contact:  when given permission Patient understands diagnosis: Yes. Discussing patient identified problems/goals with staff: Yes. Medical problems stabilized or resolved: Yes. Denies suicidal/homicidal ideation: Yes. Issues/concerns per patient self-inventory: No. Other: none  New problem(s) identified: No, Describe:  none  New Short Term/Long Term Goal(s):  Patient Goals:  "to get into a new home environment"  Discharge Plan or Barriers:   Reason for Continuation of Hospitalization: Depression Hallucinations Medication stabilization  Estimated Length of Stay: 5-7 days.  Attendees: Patient:[name] 04/30/2018   Physician: Dr. Nancy Fetter,  MD 04/30/2018   Nursing:  04/30/2018   RN Care Manager: 04/30/2018   Social Worker: Lurline Idol, LCSW 04/30/2018   Recreational Therapist:  04/30/2018   Other:  04/30/2018   Other:  04/30/2018   Other: 04/30/2018     Scribe for Treatment Team: Joanne Chars, [place] 04/30/2018 8:05 AM

## 2018-04-27 NOTE — Progress Notes (Signed)
Recreation Therapy Notes  Date: 10.4.19 Time: 1000 Location: 500 Hall Dayroom  Group Topic: Communication, Team Building, Problem Solving  Goal Area(s) Addresses:  Patient will effectively work with peer towards shared goal.  Patient will identify skill used to make activity successful.  Patient will identify how skills used during activity can be used to reach post d/c goals.   Intervention: STEM Activity   Activity: [employer]. In teams, patients were asked to build the tallest freestanding tower possible out of 15 pipe cleaners. Systematically resources were removed, for example patient ability to use both hands and patient ability to verbally communicate.    Education: Pharmacist, community, Building control surveyor.   Education Outcome: Acknowledges education/In group clarification offered/Needs additional education.   Clinical Observations/Feedback: Pt did not attend group.     Caroll Rancher, LRT/CTRS         Caroll Rancher A 04/27/2018 11:16 AM
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
## 2018-04-27 NOTE — [facility] Group Notes (Signed)
°  Group Therapy Chaplain Matt Spiritual Supports  Did not attend   Marian Sorrow, JMSW Intern 04/27/18

## 2018-04-27 NOTE — [facility] Suicide Risk Assessment (Signed)
[facility] INPATIENT:  Family/Significant Other Suicide Prevention Education  Suicide Prevention Education:  Patient Refusal for Family/Significant Other Suicide Prevention Education: The patient [name] has refused to provide written consent for family/significant other to be provided Family/Significant Other Suicide Prevention Education during admission and/or prior to discharge.  Physician notified.  Lorri Frederick, LCSW 04/27/2018, 2:54 PM
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
## 2018-04-27 NOTE — Plan of Care (Signed)
  Problem: Safety: Goal: Periods of time without injury will increase Outcome: Progressing Patient has not engaged in self harm. Denies at this time thoughts to do so.   Problem: Education: Goal: Verbalization of understanding the information provided will improve Outcome: Not Progressing Patient displays no evidence of learning from information provided this evening.

## 2018-04-27 NOTE — [facility] Suicide Risk Assessment (Signed)
[facility] Admission Suicide Risk Assessment   Nursing information obtained from:    Demographic factors:  Adolescent or young adult, Unemployed, Low socioeconomic status Current Mental Status:  Suicidal ideation indicated by patient Loss Factors:  NA(pt does not disclose information) Historical Factors:  NA(UTA, pt does not disclose this information) Risk Reduction Factors:  NA(UTA, pt does not disclose this information)  Total Time spent with patient: 1 hour Principal Problem: Schizoaffective disorder, bipolar type (HCC) Diagnosis:   Patient Active Problem List   Diagnosis Date Noted  . MDD (major depressive disorder), recurrent, severe, with psychosis (HCC) [F33.3] 10/31/2017  . Hyperprolactinemia (HCC) [E22.1] 06/17/2015  . Cocaine use disorder (HCC) [F14.10] 06/15/2015  . Schizoaffective disorder, bipolar type (HCC) [F25.0] 06/15/2015  . Cocaine use disorder, severe, dependence (HCC) [F14.20] 06/15/2015  . Alcohol use disorder, moderate, dependence (HCC) [F10.20] 06/15/2015  . PTSD (post-traumatic stress disorder) [F43.10] 06/15/2015  . Cannabis abuse [F12.10] 06/15/2015  . Rectal ulceration [K62.6] 07/08/2014  . Rectal bleeding [K62.5] 07/05/2014  . Rectal bleed [K62.5] 07/05/2014   Subjective Data: see H&P  Continued Clinical Symptoms:  Alcohol Use Disorder Identification Test Final Score (AUDIT): 34 The "Alcohol Use Disorders Identification Test", Guidelines for Use in Primary Care, Second Edition.  World Science writer [facility]). Score between 0-7:  no or low risk or alcohol related problems. Score between 8-15:  moderate risk of alcohol related problems. Score between 16-19:  high risk of alcohol related problems. Score 20 or above:  warrants further diagnostic evaluation for alcohol dependence and treatment.  Psychiatric Specialty Exam: Physical Exam  Nursing note and vitals reviewed.     Blood pressure (!) 120/56, pulse 74, temperature 98.3 F (36.8 C), temperature  source Oral, height 5\' 5"  (1.651 m), SpO2 100 %.Body mass index is 26.79 kg/m.   COGNITIVE FEATURES THAT CONTRIBUTE TO RISK:  None    SUICIDE RISK:   Moderate:  Frequent suicidal ideation with limited intensity, and duration, some specificity in terms of plans, no associated intent, good self-control, limited dysphoria/symptomatology, some risk factors present, and identifiable protective factors, including available and accessible social support.  PLAN OF CARE: see H&P  I certify that inpatient services furnished can reasonably be expected to improve the patient's condition.   Micheal Likens, MD 04/27/2018, 4:17 PM

## 2018-04-27 NOTE — Progress Notes (Signed)
D: Patient observed resting in bed. Slow to respond as this Clinical research associate approached. Minimal eye contact given. Patient very slow to respond to basic questions. Appears confused. Patient's affect flat, mood preoccupied, paranoid, ambivalent.   Denies pain, physical complaints.   A: Medicated per orders, no prns requested or needed. Medication education provided. Level III obs in place for safety. Emotional support offered. Patient encouraged to complete Suicide Safety Plan before discharge. Encouraged to attend and participate in unit programming.  R: Patient shows no evidence of learning from simple, basic information provided. Will continue to re-educate. While patient endorsed passive SI earlier today, she does deny at this time.  No HI/AVH per patient and remains safe on level III obs. Will continue to monitor throughout the night.

## 2018-04-27 NOTE — Plan of Care (Signed)
  Problem: Coping: Goal: Ability to verbalize frustrations and anger appropriately will improve Outcome: Progressing   Problem: Safety: Goal: Periods of time without injury will increase Outcome: Progressing   

## 2018-04-27 NOTE — BHH Counselor (Signed)
CSW Intern met with Cyndi Bender to discuss options upon discharge. She was difficult to engage during PSA, however, willing to "try something different".  CSW Intern asked if she wanted to discuss hospital follow up services through [facility] or residential treatment. She was groggy and reluctant to respond. Advised her that CSW would follow up over the weekend to which she stated, "Ok".     Lawana Pai, JMSW Intern 04/27/18 3:45pm

## 2018-04-28 DIAGNOSIS — F1490 Cocaine use, unspecified, uncomplicated: Secondary | ICD-10-CM

## 2018-04-28 DIAGNOSIS — F1290 Cannabis use, unspecified, uncomplicated: Secondary | ICD-10-CM

## 2018-04-28 NOTE — Progress Notes (Addendum)
[facility] MD Progress Note  04/28/2018 10:32 AM Ashleynicole Mcclees  MRN:  [id number]  Evaluation: Nolon Stalls observed resting in bed.  Patient presents flat, guarded and depressed.  Patient has minimal interaction during this assessment.  She denies homicidal suicidal ideations with head-nodding left to right.  Did not respond to questions regarding auditory or visual hallucinations.  Attempted reassessment patient remains isolate to room.  Staring blankly. Patient seen eating lunch and resting in bed.  Per staff patient is taking medications as prescribed.  Support, encouragement and reassurance was provided  History: Loxley Cibrian is a 27 y/o F with history of schizoaffective disorder, PTSD, and cocaine use disorder who was admitted voluntarily from [facility] with worsening symptoms of depression, SI with plan to hang self, AH, VH, cocaine use, and poor medication adherence. Discussed with patient about treatment options. She has recent relevant history of admission to [facility] in April 2019 at which time she was stabilized on abilify. Pt reports she is not taking any medications currently. She does not want to resume abilify because she does not feel it was effective for her. We discussed previous medication trials of risperdal (mouth swelling), haldol (mouth swelling), zyprexa (initially helpful but then stopped working), and seroquel (helpful in the past but pt stopped taking it). We discussed starting trial of seroquel for psychotic symptoms and zoloft for mood symptoms  Principal Problem: Schizoaffective disorder, bipolar type (HCC) Diagnosis:   Patient Active Problem List   Diagnosis Date Noted  . MDD (major depressive disorder), recurrent, severe, with psychosis (HCC) [F33.3] 10/31/2017  . Hyperprolactinemia (HCC) [E22.1] 06/17/2015  . Cocaine use disorder (HCC) [F14.10] 06/15/2015  . Schizoaffective disorder, bipolar type (HCC) [F25.0] 06/15/2015  . Cocaine use disorder, severe, dependence (HCC)  [F14.20] 06/15/2015  . Alcohol use disorder, moderate, dependence (HCC) [F10.20] 06/15/2015  . PTSD (post-traumatic stress disorder) [F43.10] 06/15/2015  . Cannabis abuse [F12.10] 06/15/2015  . Rectal ulceration [K62.6] 07/08/2014  . Rectal bleeding [K62.5] 07/05/2014  . Rectal bleed [K62.5] 07/05/2014   Total Time spent with patient: 20 minutes  Past Psychiatric History:   Past Medical History:  Past Medical History:  Diagnosis Date  . ADHD (attention deficit hyperactivity disorder)   . Anxiety   . Anxiety disorder   . Constipation   . Depression   . Mental disorder   . Personality disorder (HCC)   . Substance abuse [facility])     Past Surgical History:  Procedure Laterality Date  . CESAREAN SECTION    . FLEXIBLE SIGMOIDOSCOPY N/A 07/06/2014   Procedure: FLEXIBLE SIGMOIDOSCOPY;  Surgeon: Florencia Reasons, MD;  Location: [facility] ENDOSCOPY;  Service: Endoscopy;  Laterality: N/A;  . HERNIA REPAIR     Family History:  Family History  Problem Relation Age of Onset  . Schizophrenia Mother   . Alcoholism Brother   . Hypertension Maternal Grandfather   . Diabetes Maternal Grandfather   . Alcohol abuse Neg Hx   . Arthritis Neg Hx   . Asthma Neg Hx   . Birth defects Neg Hx   . Cancer Neg Hx   . COPD Neg Hx   . Depression Neg Hx   . Drug abuse Neg Hx   . Early death Neg Hx   . Hearing loss Neg Hx   . Heart disease Neg Hx   . Hyperlipidemia Neg Hx   . Kidney disease Neg Hx   . Learning disabilities Neg Hx   . Mental illness Neg Hx   . Mental retardation Neg Hx   .  Miscarriages / Stillbirths Neg Hx   . Stroke Neg Hx   . Vision loss Neg Hx   . Varicose Veins Neg Hx    Family Psychiatric  History:  Social History:  Social History   Substance and Sexual Activity  Alcohol Use Yes  . Alcohol/week: 2.0 standard drinks  . Types: 2 Cans of beer per week   Comment: Per pt 1 pint of liquor daily     Social History   Substance and Sexual Activity  Drug Use Yes  . Types:  Cocaine, "Crack" cocaine, Marijuana    Social History   Socioeconomic History  . Marital status: Single    Spouse name: Not on file  . Number of children: 3  . Years of education: Not on file  . Highest education level: Not on file  Occupational History  . Not on file  Social Needs  . Financial resource strain: Not on file  . Food insecurity:    Worry: Not on file    Inability: Not on file  . Transportation needs:    Medical: Not on file    Non-medical: Not on file  Tobacco Use  . Smoking status: Current Every Day Smoker    Packs/day: 1.00    Types: Cigarettes  . Smokeless tobacco: Never Used  Substance and Sexual Activity  . Alcohol use: Yes    Alcohol/week: 2.0 standard drinks    Types: 2 Cans of beer per week    Comment: Per pt 1 pint of liquor daily  . Drug use: Yes    Types: Cocaine, "Crack" cocaine, Marijuana  . Sexual activity: Yes  Lifestyle  . Physical activity:    Days per week: Not on file    Minutes per session: Not on file  . Stress: Not on file  Relationships  . Social connections:    Talks on phone: Not on file    Gets together: Not on file    Attends religious service: Not on file    Active member of club or organization: Not on file    Attends meetings of clubs or organizations: Not on file    Relationship status: Not on file  Other Topics Concern  . Not on file  Social History Narrative  . Not on file   Additional Social History:                         Sleep: Fair  Appetite:  Fair  Current Medications: Current Facility-Administered Medications  Medication Dose Route Frequency Provider Last Rate Last Dose  . acetaminophen (TYLENOL) tablet 650 mg  650 mg Oral Q6H PRN Laveda Abbe, NP      . alum & mag hydroxide-simeth (MAALOX/MYLANTA) 200-200-20 MG/5ML suspension 30 mL  30 mL Oral Q4H PRN Laveda Abbe, NP      . benztropine (COGENTIN) tablet 1 mg  1 mg Oral BID PRN Laveda Abbe, NP      . hydrOXYzine  (ATARAX/VISTARIL) tablet 50 mg  50 mg Oral Q6H PRN Laveda Abbe, NP   50 mg at 04/26/18 1829  . magnesium hydroxide (MILK OF MAGNESIA) suspension 30 mL  30 mL Oral Daily PRN Laveda Abbe, NP      . nicotine polacrilex (NICORETTE) gum 2 mg  2 mg Oral PRN Laveda Abbe, NP   2 mg at 04/26/18 2012  . pseudoephedrine (SUDAFED) 12 hr tablet 120 mg  120 mg Oral Q12H PRN Jolyne Loa  T, MD      . QUEtiapine (SEROQUEL) tablet 150 mg  150 mg Oral QHS Micheal Likens, MD   150 mg at 04/27/18 2020  . sertraline (ZOLOFT) tablet 50 mg  50 mg Oral Daily Micheal Likens, MD   50 mg at 04/28/18 1009  . traZODone (DESYREL) tablet 50 mg  50 mg Oral QHS PRN Laveda Abbe, NP        Lab Results: No results found for this or any previous visit (from the past 48 hour(s)).  Blood Alcohol level:  Lab Results  Component Value Date   ETH <10 04/26/2018   ETH 22 (H) 10/31/2017    Metabolic Disorder Labs: Lab Results  Component Value Date   HGBA1C 5.1 08/07/2014   MPG 100 08/07/2014   Lab Results  Component Value Date   PROLACTIN 33.2 (H) 06/16/2015   Lab Results  Component Value Date   CHOL 151 08/07/2014   TRIG 99 08/07/2014   HDL 81 08/07/2014   CHOLHDL 1.9 08/07/2014   VLDL 20 08/07/2014   LDLCALC 50 08/07/2014    Physical Findings: AIMS: Facial and Oral Movements Muscles of Facial Expression: None, normal Lips and Perioral Area: None, normal Jaw: None, normal Tongue: None, normal,Extremity Movements Upper (arms, wrists, hands, fingers): None, normal Lower (legs, knees, ankles, toes): None, normal, Trunk Movements Neck, shoulders, hips: None, normal, Overall Severity Severity of abnormal movements (highest score from questions above): None, normal Incapacitation due to abnormal movements: None, normal Patient's awareness of abnormal movements (rate only patient's report): No Awareness, Dental Status Current problems with teeth  and/or dentures?: No Does patient usually wear dentures?: No  CIWA:  CIWA-Ar Total: 4 COWS:  COWS Total Score: 4  Musculoskeletal: Strength & Muscle Tone: within normal limits Gait & Station: normal Patient leans: N/A  Psychiatric Specialty Exam: Physical Exam  Neurological: She is alert.  Psychiatric: She has a normal mood and affect. Her behavior is normal.    Review of Systems  Psychiatric/Behavioral: Positive for hallucinations. The patient is nervous/anxious.   All other systems reviewed and are negative.   Blood pressure 104/86, pulse 87, temperature 98.2 F (36.8 C), temperature source Oral, resp. rate 18, height 5\' 5"  (1.651 m), SpO2 100 %.Body mass index is 26.79 kg/m.  General Appearance: Guarded  Eye Contact:  Minimal  Speech:  Clear and Coherent  Volume:  Normal  Mood:  Depressed and Dysphoric  Affect:  Depressed and Flat  Thought Process:  Linear  Orientation:  Full (Time, Place, and Person)  Thought Content:  Paranoid Ideation  Suicidal Thoughts:  Yes.  with intent/plan  Homicidal Thoughts:  No  Memory:  Immediate;   Fair Recent;   Fair  Judgement:  Poor  Insight:  Lacking  Psychomotor Activity:  Normal  Concentration:  Concentration: Fair  Recall:  [employer] of Knowledge:  Fair  Language:  Fair  Akathisia:  No  Handed:  Right  AIMS (if indicated):     Assets:  Communication Skills Desire for Improvement Social Support  ADL's:  Intact  Cognition:  WNL  Sleep:  Number of Hours: 5.75     Treatment Plan Summary: Daily contact with patient to assess and evaluate symptoms and progress in treatment and Medication management   Continue with current treatment plan on 04/28/2018 as listed below except where noted  Mood Stabilization:   Continue with Seroquel 150 mg   Continue Zoloft 50 mg    Continue with Trazodone 50 mg  Will continue to monitor vitals ,medication compliance and treatment side effects while patient is here.   Reviewed labs:  ,BAL -, UDS - pos for cocaine and benzodizpines.  CSW will continue working on disposition.  Patient to participate in therapeutic milieu  Oneta Rack, NP 04/28/2018, 10:32 AM    ..Agree with NP Progress Note

## 2018-04-28 NOTE — Plan of Care (Signed)
Patient was depressed and flat upon approach this morning. Patient denied SI HI AVH, but was talking very quietly and slowly. Patient has isolated herself for most of the day and doesn't participate in group therapies. Safety is maintained with 15 minute checks as well as environmental checks. Will continue to monitor. Patient is compliant with medications prescribed per provider.  Problem: Education: Goal: Emotional status will improve Outcome: Progressing   Problem: Education: Goal: Mental status will improve Outcome: Progressing   Problem: Education: Goal: Verbalization of understanding the information provided will improve Outcome: Progressing

## 2018-04-28 NOTE — Plan of Care (Deleted)
Patient was confused upon approach. Patient denied SI HI AVH although seemed a bit preoccupied when walking around the unit. Patient is compliant with medications prescribed per provider. Safety is maintained with 15 minute checks as well as environmental checks. Patient has been participating in group therapies, as well as attending meals and going to recreation therapy. Will continue to monitor.   Problem: Education: Goal: Knowledge of [facility] Education information/materials will improve 04/28/2018 1702 by Dewayne Shorter, RN Outcome: Progressing 04/28/2018 1256 by Dewayne Shorter, RN Outcome: Progressing   Problem: Education: Goal: Emotional status will improve 04/28/2018 1702 by Dewayne Shorter, RN Outcome: Progressing 04/28/2018 1256 by Dewayne Shorter, RN Outcome: Progressing   Problem: Education: Goal: Mental status will improve 04/28/2018 1702 by Dewayne Shorter, RN Outcome: Progressing 04/28/2018 1256 by Dewayne Shorter, RN Outcome: Progressing

## 2018-04-28 NOTE — BHH Group Notes (Signed)
  B[facility] LCSW Group Therapy Note  Date/Time:  04/28/2018 11:15AM-12:00PM  Type of Therapy and Topic:  Group Therapy:  Feelings About Hospitalization  Participation Level:  Did Not Attend   Description of Group This process group involved patients discussing their feelings related to being hospitalized, as well as the benefits they see to being in the hospital.  These feelings and benefits were itemized.  The group then brainstormed specific ways in which they could seek those same benefits when they discharge and return home.  Therapeutic Goals 1. Patient will identify and describe positive and negative feelings related to hospitalization 2. Patient will verbalize benefits of hospitalization to themselves personally 3. Patients will brainstorm together ways they can obtain similar benefits in the outpatient setting, identify barriers to wellness and possible solutions  Summary of Patient Progress:  N/A  Therapeutic Modalities Cognitive Behavioral Therapy Motivational Interviewing    Ambrose Mantle, LCSW 04/28/2018, 8:36 AM

## 2018-04-29 NOTE — Progress Notes (Signed)
Adult Psychoeducational Group Note  Date:  04/29/2018 Time:  8:49 PM  Group Topic/Focus:  Wrap-Up Group:   The focus of this group is to help patients review their daily goal of treatment and discuss progress on daily workbooks.  Participation Level:  Active  Participation Quality:  Appropriate  Affect:  Appropriate  Cognitive:  Appropriate  Insight: Lacking  Engagement in Group:  Engaged  Modes of Intervention:  Socialization and Support  Additional Comments:  Patient attended and participated in group tonight. She reports having an OK day. She went for her groups and meals. She went outside with the group. Patient advised that she is ready to leave.  Lita Mains [facility] 04/29/2018, 8:49 PM

## 2018-04-29 NOTE — Plan of Care (Signed)
  Problem: Education: Goal: Emotional status will improve Outcome: Not Progressing   Problem: Activity: Goal: Interest or engagement in activities will improve Outcome: Not Progressing   Problem: Safety: Goal: Periods of time without injury will increase Outcome: Progressing   D: Pt alert and oriented on the unit. Pt engaging with RN staff and other pts. Pt denies SI/HI, A/VH, and any pain. Pt's affect was flat with depressed mood. Pt is still isolated and withdrawn to her room. Pt attended social work group. Pt is pleasant and cooperative. A: Education, support and encouragement provided, q15 minute safety checks remain in effect. Medications administered per MD orders. R: No reactions/side effects to medicine noted. Pt denies any concerns at this time, and verbally contracts for safety. Pt ambulating on and off the unit with no issues. Pt remains safe on and off the unit.

## 2018-04-29 NOTE — Progress Notes (Addendum)
[facility] MD Progress Note  04/29/2018 11:27 AM Shakiah Wester  MRN:  [id number]  Evaluation: Nolon Stalls seen resting in bed.  She is awake alert and oriented x3 continues to deny suicidal or homicidal ideations.  Denies auditory or visual hallucinations with head nodding.  Continues to be minimal with responses.  Reports she slept well on last night.  Patient continues to isolate to her room.  Chart review minimal participation with daily group sessions.  Reports she is taking her medication and tolerating them well.  Denies oversedation with medication.  Patient appears depressed, flat and guarded.  Encouraged to attend therapy sessions patient was agreeable.  Support encouragement reassurance was provided.  History: Niara Bunker is a 27 y/o F with history of schizoaffective disorder, PTSD, and cocaine use disorder who was [facility] voluntarily from [facility] with worsening symptoms of depression, SI with plan to hang self, AH, VH, cocaine use, and poor medication adherence. Discussed with patient about treatment options. She has recent relevant history of admission to [facility] in April 2019 at which time she was stabilized on abilify. Pt reports she is not taking any medications currently. She does not want to resume abilify because she does not feel it was effective for her. We discussed previous medication trials of risperdal (mouth swelling), haldol (mouth swelling), zyprexa (initially helpful but then stopped working), and seroquel (helpful in the past but pt stopped taking it). We discussed starting trial of seroquel for psychotic symptoms and zoloft for mood symptoms  Principal Problem: Schizoaffective disorder, bipolar type (HCC) Diagnosis:   Patient Active Problem List   Diagnosis Date Noted  . MDD (major depressive disorder), recurrent, severe, with psychosis (HCC) [F33.3] 10/31/2017  . Hyperprolactinemia (HCC) [E22.1] 06/17/2015  . Cocaine use disorder (HCC) [F14.10] 06/15/2015  . Schizoaffective  disorder, bipolar type (HCC) [F25.0] 06/15/2015  . Cocaine use disorder, severe, dependence (HCC) [F14.20] 06/15/2015  . Alcohol use disorder, moderate, dependence (HCC) [F10.20] 06/15/2015  . PTSD (post-traumatic stress disorder) [F43.10] 06/15/2015  . Cannabis abuse [F12.10] 06/15/2015  . Rectal ulceration [K62.6] 07/08/2014  . Rectal bleeding [K62.5] 07/05/2014  . Rectal bleed [K62.5] 07/05/2014   Total Time spent with patient: 20 minutes  Past Psychiatric History:   Past Medical History:  Past Medical History:  Diagnosis Date  . ADHD (attention deficit hyperactivity disorder)   . Anxiety   . Anxiety disorder   . Constipation   . Depression   . Mental disorder   . Personality disorder (HCC)   . Substance abuse [facility])     Past Surgical History:  Procedure Laterality Date  . CESAREAN SECTION    . FLEXIBLE SIGMOIDOSCOPY N/A 07/06/2014   Procedure: FLEXIBLE SIGMOIDOSCOPY;  Surgeon: Florencia Reasons, MD;  Location: [facility] ENDOSCOPY;  Service: Endoscopy;  Laterality: N/A;  . HERNIA REPAIR     Family History:  Family History  Problem Relation Age of Onset  . Schizophrenia Mother   . Alcoholism Brother   . Hypertension Maternal Grandfather   . Diabetes Maternal Grandfather   . Alcohol abuse Neg Hx   . Arthritis Neg Hx   . Asthma Neg Hx   . Birth defects Neg Hx   . Cancer Neg Hx   . COPD Neg Hx   . Depression Neg Hx   . Drug abuse Neg Hx   . Early death Neg Hx   . Hearing loss Neg Hx   . Heart disease Neg Hx   . Hyperlipidemia Neg Hx   . Kidney disease Neg Hx   .  Learning disabilities Neg Hx   . Mental illness Neg Hx   . Mental retardation Neg Hx   . Miscarriages / Stillbirths Neg Hx   . Stroke Neg Hx   . Vision loss Neg Hx   . Varicose Veins Neg Hx    Family Psychiatric  History:  Social History:  Social History   Substance and Sexual Activity  Alcohol Use Yes  . Alcohol/week: 2.0 standard drinks  . Types: 2 Cans of beer per week   Comment: Per pt 1 pint of  liquor daily     Social History   Substance and Sexual Activity  Drug Use Yes  . Types: Cocaine, "Crack" cocaine, Marijuana    Social History   Socioeconomic History  . Marital status: Single    Spouse name: Not on file  . Number of children: 3  . Years of education: Not on file  . Highest education level: Not on file  Occupational History  . Not on file  Social Needs  . Financial resource strain: Not on file  . Food insecurity:    Worry: Not on file    Inability: Not on file  . Transportation needs:    Medical: Not on file    Non-medical: Not on file  Tobacco Use  . Smoking status: Current Every Day Smoker    Packs/day: 1.00    Types: Cigarettes  . Smokeless tobacco: Never Used  Substance and Sexual Activity  . Alcohol use: Yes    Alcohol/week: 2.0 standard drinks    Types: 2 Cans of beer per week    Comment: Per pt 1 pint of liquor daily  . Drug use: Yes    Types: Cocaine, "Crack" cocaine, Marijuana  . Sexual activity: Yes  Lifestyle  . Physical activity:    Days per week: Not on file    Minutes per session: Not on file  . Stress: Not on file  Relationships  . Social connections:    Talks on phone: Not on file    Gets together: Not on file    Attends religious service: Not on file    Active member of club or organization: Not on file    Attends meetings of clubs or organizations: Not on file    Relationship status: Not on file  Other Topics Concern  . Not on file  Social History Narrative  . Not on file   Additional Social History:                         Sleep: Fair  Appetite:  Fair  Current Medications: Current Facility-Administered Medications  Medication Dose Route Frequency Provider Last Rate Last Dose  . acetaminophen (TYLENOL) tablet 650 mg  650 mg Oral Q6H PRN Laveda Abbe, NP   650 mg at 04/28/18 2012  . alum & mag hydroxide-simeth (MAALOX/MYLANTA) 200-200-20 MG/5ML suspension 30 mL  30 mL Oral Q4H PRN Laveda Abbe, NP      . benztropine (COGENTIN) tablet 1 mg  1 mg Oral BID PRN Laveda Abbe, NP      . hydrOXYzine (ATARAX/VISTARIL) tablet 50 mg  50 mg Oral Q6H PRN Laveda Abbe, NP   50 mg at 04/28/18 1846  . magnesium hydroxide (MILK OF MAGNESIA) suspension 30 mL  30 mL Oral Daily PRN Laveda Abbe, NP      . nicotine polacrilex (NICORETTE) gum 2 mg  2 mg Oral PRN Laveda Abbe, NP  2 mg at 04/29/18 0903  . pseudoephedrine (SUDAFED) 12 hr tablet 120 mg  120 mg Oral Q12H PRN Micheal Likens, MD   120 mg at 04/28/18 1813  . QUEtiapine (SEROQUEL) tablet 150 mg  150 mg Oral QHS Micheal Likens, MD   150 mg at 04/28/18 2012  . sertraline (ZOLOFT) tablet 50 mg  50 mg Oral Daily Micheal Likens, MD   50 mg at 04/29/18 0903  . traZODone (DESYREL) tablet 50 mg  50 mg Oral QHS PRN Laveda Abbe, NP   50 mg at 04/29/18 0150    Lab Results: No results found for this or any previous visit (from the past 48 hour(s)).  Blood Alcohol level:  Lab Results  Component Value Date   ETH <10 04/26/2018   ETH 22 (H) 10/31/2017    Metabolic Disorder Labs: Lab Results  Component Value Date   HGBA1C 5.1 08/07/2014   MPG 100 08/07/2014   Lab Results  Component Value Date   PROLACTIN 33.2 (H) 06/16/2015   Lab Results  Component Value Date   CHOL 151 08/07/2014   TRIG 99 08/07/2014   HDL 81 08/07/2014   CHOLHDL 1.9 08/07/2014   VLDL 20 08/07/2014   LDLCALC 50 08/07/2014    Physical Findings: AIMS: Facial and Oral Movements Muscles of Facial Expression: None, normal Lips and Perioral Area: None, normal Jaw: None, normal Tongue: None, normal,Extremity Movements Upper (arms, wrists, hands, fingers): None, normal Lower (legs, knees, ankles, toes): None, normal, Trunk Movements Neck, shoulders, hips: None, normal, Overall Severity Severity of abnormal movements (highest score from questions above): None, normal Incapacitation due to  abnormal movements: None, normal Patient's awareness of abnormal movements (rate only patient's report): No Awareness, Dental Status Current problems with teeth and/or dentures?: No Does patient usually wear dentures?: No  CIWA:  CIWA-Ar Total: 4 COWS:  COWS Total Score: 4  Musculoskeletal: Strength & Muscle Tone: within normal limits Gait & Station: normal Patient leans: N/A  Psychiatric Specialty Exam: Physical Exam  Neurological: She is alert.  Psychiatric: She has a normal mood and affect. Her behavior is normal.    Review of Systems  Psychiatric/Behavioral: Positive for hallucinations. The patient is nervous/anxious.   All other systems reviewed and are negative.   Blood pressure 118/79, pulse 99, temperature 98.2 F (36.8 C), temperature source Oral, resp. rate 18, height 5\' 5"  (1.651 m), SpO2 100 %.Body mass index is 26.79 kg/m.  General Appearance: Guarded and Flat and minimal interaction during assessment  Eye Contact:  Minimal  Speech:  Clear and Coherent  Volume:  Normal  Mood:  Depressed and Dysphoric  Affect:  Depressed and Flat  Thought Process:  Linear  Orientation:  Full (Time, Place, and Person)  Thought Content:  Paranoid Ideation  Suicidal Thoughts:  Yes.  with intent/plan  Homicidal Thoughts:  No  Memory:  Immediate;   Fair Recent;   Fair  Judgement:  Poor  Insight:  Lacking  Psychomotor Activity:  Normal  Concentration:  Concentration: Fair  Recall:  [employer] of Knowledge:  Fair  Language:  Fair  Akathisia:  No  Handed:  Right  AIMS (if indicated):     Assets:  Communication Skills Desire for Improvement Social Support  ADL's:  Intact  Cognition:  WNL  Sleep:  Number of Hours: 4.75     Treatment Plan Summary: Daily contact with patient to assess and evaluate symptoms and progress in treatment and Medication management   Continue with  current treatment plan on 04/29/2018 as listed below except were noted  Mood Stabilization:    Continue with Seroquel 150 mg   Continue Zoloft 50 mg    Continue with Trazodone 50 mg   Will continue to monitor vitals ,medication compliance and treatment side effects while patient is here.   Reviewed labs: ,BAL -, UDS - pos for cocaine and benzodizpines.  CSW will continue working on disposition.  Patient to participate in therapeutic milieu  Oneta Rack, NP 04/29/2018, 11:27 AM    ..Agree with NP Progress Note

## 2018-04-29 NOTE — [facility] Group Notes (Signed)
[facility] Group Notes:  (Nursing/MHT/Case Management/Adjunct)  Date:  04/29/2018  Time:  4:00 PM  Type of Therapy:  Nurse Education  Participation Level:  Active  Participation Quality:  Appropriate and Attentive  Affect:  Appropriate  Cognitive:  Alert and Appropriate  Insight:  Appropriate  Engagement in Group:  Engaged  Modes of Intervention:  Discussion and Education  Summary of Progress/Problems: pt's discussed and practiced relaxation techniques they can utilize throughout the day when coping.    Suszanne Conners Darene Nappi 04/29/2018, 4:43 PM

## 2018-04-29 NOTE — Progress Notes (Signed)
Patient ID: [name], female   DOB: 09-Jul-1991, 27 y.o.   MRN: [id number] DAR Note: Pt observed pacing up and down the hallway. Pt observed to be a little anxious and confuse or could be reacting to some internal voices. Pt denied depression, anxiety or pain. Pt also denied SI/HI/AVH. Support, encouragement, and safe environment provided. All Pt questions and concerns addressed. Pt was med compliant. Will continue to monitor for safety.

## 2018-04-29 NOTE — B[facility] Group Notes (Signed)
[facility] LCSW Group Therapy Note  Date/Time:  04/29/2018  11:00AM-12:00PM  Type of Therapy and Topic:  Group Therapy:  Music and Mood  Participation Level:  Active   Description of Group: In this process group, members listened to a variety of genres of music and identified that different types of music evoke different responses.  Patients were encouraged to identify music that was soothing for them and music that was energizing for them.  Patients discussed how this knowledge can help with wellness and recovery in various ways including managing depression and anxiety as well as encouraging healthy sleep habits.    Therapeutic Goals: 1. Patients will explore the impact of different varieties of music on mood 2. Patients will verbalize the thoughts they have when listening to different types of music 3. Patients will identify music that is soothing to them as well as music that is energizing to them 4. Patients will discuss how to use this knowledge to assist in maintaining wellness and recovery 5. Patients will explore the use of music as a coping skill  Summary of Patient Progress:  At the beginning of group, patient expressed that she felt "alright" at the beginning of group and "normal" at the end of group.  She was the only participant who was fully engaged and did not ask for various songs to be changed.  Therapeutic Modalities: Solution Focused Brief Therapy Activity   Ambrose Mantle, LCSW

## 2018-04-30 MED ORDER — QUETIAPINE FUMARATE 50 MG PO TABS: 150.0000 mg | ORAL_TABLET | Freq: Every day | ORAL | 0 refills | Status: DC

## 2018-04-30 MED ORDER — SERTRALINE HCL 50 MG PO TABS: 50.0000 mg | ORAL_TABLET | Freq: Every day | ORAL | 0 refills | Status: DC

## 2018-04-30 NOTE — Plan of Care (Signed)
Pt was able to identify some positive coping skills at completion of recreation therapy group session.   Anayelli Lai, LRT/CTRS 

## 2018-04-30 NOTE — Progress Notes (Signed)
  [facility] Adult Case Management Discharge Plan :  Will you be returning to the same living situation after discharge:  No. Stated she will go to a hotel on [address] At discharge, do you have transportation home?: Yes,  friend Do you have the ability to pay for your medications: Yes,  mental health  Release of information consent forms completed and in the chart;  Patient's signature needed at discharge.  Patient to Follow up at: Follow-up Information    [facility] Follow up.   Specialty:  Professional Counselor Why:  Go to the walk-in clinic with-in 3 days of d/c from the hospital for your hospital follow up appointment. They are open M-F between 8:30 and 11:00 and 1 and 4. Contact information: [facility] [place] [address] [place] [address] [phone]           Next level of care provider has access to [facility] Link:no  Safety Planning and Suicide Prevention discussed: Yes,  yes  Have you used any form of tobacco in the last 30 days? (Cigarettes, Smokeless Tobacco, Cigars, and/or Pipes): Yes  Has patient been referred to the Quitline?: Patient refused referral  Patient has been referred for addiction treatment: Pt. refused referral  Ida Rogue, LCSW 04/30/2018, 9:34 AM
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
## 2018-04-30 NOTE — Progress Notes (Signed)
Patient ID: [name], female   DOB: Jul 06, 1991, 27 y.o.   MRN: [id number] Patient discharged to home selfcare in the presence of friends.  Patient denies SI, HI and AVH upon discharge.  Patient acknowledged understanding of all discharge instructions and receipt of personal belongings.

## 2018-04-30 NOTE — Progress Notes (Signed)
Recreation Therapy Notes  Date: 10.7.19 Time: 1000 Location: 500 Hall Dayroom  Group Topic: Coping Skills  Goal Area(s) Addresses:  Patient will be able to identify coping skills. Patient will be able to identify benefit of using coping skills post d/c.  Behavioral Response: Engaged  Intervention: Worksheet, pencils  Activity: Orthoptist.  Patients were to identify the things that they feel are holding them back and write them inside the spider web.  Patients were to then identify their coping skills and write them on the outside of the web.  Education: Pharmacologist, Building control surveyor.   Education Outcome: Acknowledges understanding/In group clarification offered/Needs additional education.   Clinical Observations/Feedback: Pt is being discharged today and stated the biggest thing facing her when she leaves is "cocaine".  Pt stated her coping skills for dealing with it were to "stay away from it, don't think about it, draw and listen to music".  Pt admitted that she likes to do cocaine and isn't ready to stop using.  Pt also admitted she was going to do some cocaine once she is discharged.  Pt expressed she doesn't smoke weed because it makes her feel like the television is talking to her.    Caroll Rancher, LRT/CTRS     Caroll Rancher A 04/30/2018 10:43 AM

## 2018-04-30 NOTE — Progress Notes (Signed)
Recreation Therapy Notes  INPATIENT RECREATION TR PLAN  Patient Details Name: Early Ord MRN: [id number] DOB: 03/23/1991 Today's Date: 04/30/2018  Rec Therapy Plan Is patient appropriate for Therapeutic Recreation?: Yes Treatment times per week: about 3 days Estimated Length of Stay: 5-7 days TR Treatment/Interventions: Group participation (Comment)  Discharge Criteria Pt will be discharged from therapy if:: Discharged Treatment plan/goals/alternatives discussed and agreed upon by:: Patient/family  Discharge Summary Short term goals set: See patient care plan Short term goals met: Adequate for discharge Progress toward goals comments: Groups attended Which groups?: Coping skills Reason goals not met: None Therapeutic equipment acquired: None Reason patient discharged from therapy: Discharge from hospital Pt/family agrees with progress & goals achieved: Yes Date patient discharged from therapy: 04/30/18   Victorino Sparrow, LRT/CTRS   Ria Comment, Khadim Lundberg A 04/30/2018, 11:02 AM
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
## 2018-06-25 ENCOUNTER — Encounter: Payer: Self-pay | Admitting: Emergency Medicine

## 2018-06-25 ENCOUNTER — Emergency Department (HOSPITAL_COMMUNITY)
Admission: EM | Admit: 2018-06-25 | Discharge: 2018-06-26 | Disposition: A | Discharge status: Other Acute Inpatient Hospital | Payer: Medicaid Other | Attending: Emergency Medicine | Admitting: Emergency Medicine

## 2018-06-25 DIAGNOSIS — F1490 Cocaine use, unspecified, uncomplicated: Secondary | ICD-10-CM | POA: Insufficient documentation

## 2018-06-25 DIAGNOSIS — F32A Depression, unspecified: Secondary | ICD-10-CM

## 2018-06-25 DIAGNOSIS — F333 Major depressive disorder, recurrent, severe with psychotic symptoms: Secondary | ICD-10-CM | POA: Insufficient documentation

## 2018-06-25 DIAGNOSIS — Z91040 Latex allergy status: Secondary | ICD-10-CM | POA: Insufficient documentation

## 2018-06-25 DIAGNOSIS — F329 Major depressive disorder, single episode, unspecified: Secondary | ICD-10-CM

## 2018-06-25 DIAGNOSIS — F259 Schizoaffective disorder, unspecified: Secondary | ICD-10-CM | POA: Insufficient documentation

## 2018-06-25 DIAGNOSIS — F17210 Nicotine dependence, cigarettes, uncomplicated: Secondary | ICD-10-CM | POA: Insufficient documentation

## 2018-06-25 DIAGNOSIS — Z79899 Other long term (current) drug therapy: Secondary | ICD-10-CM | POA: Insufficient documentation

## 2018-06-25 DIAGNOSIS — R45851 Suicidal ideations: Secondary | ICD-10-CM | POA: Insufficient documentation

## 2018-06-25 DIAGNOSIS — Z915 Personal history of self-harm: Secondary | ICD-10-CM | POA: Insufficient documentation

## 2018-06-25 LAB — I-STAT BETA HCG BLOOD, ED (MC, WL, AP ONLY): I-stat hCG, quantitative: <5 m[IU]/mL (ref <5)

## 2018-06-25 LAB — CBC
HCT: 43.3 % (ref 36.0–46.0)
Hemoglobin: 13.6 g/dL (ref 12.0–15.0)
MCH: 29.1 pg (ref 26.0–34.0)
MCHC: 31.4 g/dL (ref 30.0–36.0)
MCV: 92.5 fL (ref 80.0–100.0)
Platelets: 357 10*3/uL (ref 150–400)
RBC: 4.68 MIL/uL (ref 3.87–5.11)
RDW: 12.9 % (ref 11.5–15.5)
WBC: 8.3 10*3/uL (ref 4.0–10.5)
nRBC: 0 % (ref 0.0–0.2)

## 2018-06-25 LAB — COMPREHENSIVE METABOLIC PANEL
ALT: 19 U/L (ref 0–44)
AST: 26 U/L (ref 15–41)
Albumin: 4.8 g/dL (ref 3.5–5.0)
Alkaline Phosphatase: 134 U/L — ABNORMAL HIGH (ref 38–126)
Anion gap: 13 (ref 5–15)
BUN: 8 mg/dL (ref 6–20)
CO2: 22 mmol/L (ref 22–32)
Calcium: 8.9 mg/dL (ref 8.9–10.3)
Chloride: 105 mmol/L (ref 98–111)
Creatinine, Ser: 0.82 mg/dL (ref 0.44–1.00)
GFR calc Af Amer: >60 mL/min (ref >60)
GFR calc non Af Amer: >60 mL/min (ref >60)
Glucose, Bld: 80 mg/dL (ref 70–99)
Potassium: 3.3 mmol/L — ABNORMAL LOW (ref 3.5–5.1)
Sodium: 140 mmol/L (ref 135–145)
Total Bilirubin: 0.4 mg/dL (ref 0.3–1.2)
Total Protein: 8.6 g/dL — ABNORMAL HIGH (ref 6.5–8.1)

## 2018-06-25 LAB — ETHANOL: Alcohol, Ethyl (B): <10 mg/dL (ref <10)

## 2018-06-25 LAB — SALICYLATE LEVEL: Salicylate Lvl: 12.6 mg/dL (ref 2.8–30.0)

## 2018-06-25 LAB — ACETAMINOPHEN LEVEL: Acetaminophen (Tylenol), Serum: <10 ug/mL — ABNORMAL LOW (ref 10–30)

## 2018-06-25 MED ORDER — ONDANSETRON HCL 4 MG PO TABS: 4.0000 mg | ORAL_TABLET | Freq: Three times a day (TID) | ORAL | Status: DC | PRN

## 2018-06-25 MED ORDER — ACETAMINOPHEN 325 MG PO TABS: 650.0000 mg | ORAL_TABLET | ORAL | Status: DC | PRN

## 2018-06-25 NOTE — ED Notes (Signed)
Patient has a red top in the main lab 

## 2018-06-25 NOTE — ED Notes (Signed)
Pt had a ring stuck on her finger. Ring was removed by ortho, without any complication. Ring placed in belongings

## 2018-06-25 NOTE — ED Notes (Signed)
Pt alert and oriented. Pt c/o of SI,HI, AND AVH  at this time. Pt contract to safety. Pt states " im hear and seeing things that are not there" Pt explains that's the voices are saying mean things about her. Pt cooperative and answers questions approprietly.Pt safe will continue to monitor.

## 2018-06-25 NOTE — ED Triage Notes (Signed)
Patient tried to hang herself in the bathroom 1 week ago. Patient explains she just wanted to die. Patient still having SI in triage. Patient unsure of how she will try to end her life next time. Patient here today wanting help.   A/Ox4 Ambulatory in triage.

## 2018-06-25 NOTE — BH Assessment (Signed)
Assessment Note  [name] is an 27 y.o. female that presents this date with S/I. Patient voices intent although does not verbalize a plan. Patient states "I have ways" although will not elaborate. Patient is very disorganized and renders limited history. Patient will not stay in her room as this writer attempts to assess. Patient displays flight of ideas and is difficult to redirect. Patient cannot explain why she is here today and is very paranoid looking under her bed and in her bathroom. Patient has a history of SA and per note review was last seen at  [facility] on 04/26/18 when she presented with similar symptoms requiring a psychiatric admission at that time. Patient tested positive on that date for cocaine. Patient's UDS this date is pending and patient will not answer when asked if she is currently using any illicit substances. Per chart review, patient has a history of schizoaffective disorder, anxiety and depression. It is unclear if patient is currently on any medications to assist with symptom management. Patient is not oriented to time or place and will not answer any questions beyond voicing thoughts of self harm. Patient shakes her head no when asked in reference to H/I or AVH although this writer is uncertain if patient is processing the content of this writer's questions. Per notes patient has a history of depression and suicidal thoughts. She has been having thoughts of hanging herself and tried last week. She is very tearful. Patient  cannot verbalizes why she is hear. Patient attempted to hang herself in the bathroom 1 week ago. Patient explains she just wanted to die.Patient still having SI in triage. Patient unsure of how she will try to end her life next time. Patient here today wanting help. Case was staffed with Shaune Pollack DNP who recommended a inpatient admission to assist with stabilization.    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 ADHD (attention deficit hyperactivity disorder)   . Anxiety   . Anxiety disorder   . Constipation   . Depression   . Mental disorder   . Personality disorder (HCC)   . Substance abuse [facility])     Past Surgical History:  Procedure Laterality Date  . CESAREAN SECTION    . FLEXIBLE SIGMOIDOSCOPY N/A 07/06/2014   Procedure: FLEXIBLE SIGMOIDOSCOPY;  Surgeon: Florencia Reasons, MD;  Location: [facility] ENDOSCOPY;  Service: Endoscopy;  Laterality: N/A;  . HERNIA REPAIR      Family History:  Family History  Problem Relation Age of Onset  . Schizophrenia Mother   . Alcoholism Brother   . Hypertension Maternal Grandfather   . Diabetes Maternal Grandfather   . Alcohol abuse Neg Hx   . Arthritis Neg Hx   . Asthma Neg Hx   . Birth defects Neg Hx   . Cancer Neg Hx   . COPD Neg Hx   . Depression Neg Hx   . Drug abuse Neg Hx   . Early death Neg Hx   . Hearing loss Neg Hx   . Heart disease Neg Hx   . Hyperlipidemia Neg Hx   . Kidney disease Neg Hx   . Learning disabilities Neg Hx   . Mental illness Neg Hx   . Mental retardation Neg Hx   . Miscarriages / Stillbirths Neg Hx   . Stroke Neg Hx   . Vision loss Neg Hx   . Varicose Veins Neg Hx  Social History:  reports that she has been smoking cigarettes. She has been smoking about 1.00 pack per day. She has never used smokeless tobacco. She reports that she drinks about 2.0 standard drinks of alcohol per week. She reports that she has current or past drug history. Drugs: Cocaine, "Crack" cocaine, and Marijuana.  Additional Social History:  Alcohol / Drug Use Pain Medications: see MAR Prescriptions: see MAR Over the Counter: see MAR History of alcohol / drug use?: Yes Longest period of sobriety (when/how long): unknown Negative Consequences of Use: (Denies) Withdrawal Symptoms: (Denies) Substance #1 Name of Substance 1: Cocaine per hx 1 - Age of First Use: UTA 1 - Amount (size/oz):  UTA 1 - Frequency: UTA 1 - Duration: UTA 1 - Last Use / Amount: UTA UDS pending   CIWA: CIWA-Ar BP: (!) 149/129 Pulse Rate: 73 COWS:    Allergies:  Allergies  Allergen Reactions  . Latex Hives and Swelling    Patient states vaginal swelling occurs if a latex condom was used.  . Haldol [Haloperidol Lactate] Swelling  . Risperdal [Risperidone] Swelling    Home Medications:  (Not in a hospital admission)  OB/GYN Status:  Patient's last menstrual period was 06/06/2018.  General Assessment Data [facility] of Assessment: [facility] TTS Assessment: In system Is this a Tele or Face-to-Face Assessment?: Face-to-Face Is this an Initial Assessment or a Re-assessment for this encounter?: Initial Assessment Patient Accompanied by:: N/A Language Other than English: No Living Arrangements: (Alone) What gender do you identify as?: Female Marital status: Single Maiden name: NA Pregnancy Status: Unknown Living Arrangements: Alone Can pt return to current living arrangement?: Yes Admission Status: Voluntary Is patient capable of signing voluntary admission?: Yes Referral Source: Self/Family/Friend Insurance type: Self Pay  Medical Screening Exam [facility] Walk-in ONLY) Medical Exam completed: Yes  Crisis Care Plan Living Arrangements: Alone Legal Guardian: (NA) Name of Psychiatrist: None Name of Therapist: None  Education Status Is patient currently in school?: No Is the patient employed, unemployed or receiving disability?: Receiving disability income  Risk to self with the past 6 months Suicidal Ideation: Yes-Currently Present Has patient been a risk to self within the past 6 months prior to admission? : Yes Suicidal Intent: Yes-Currently Present Has patient had any suicidal intent within the past 6 months prior to admission? : Yes Is patient at risk for suicide?: Yes Suicidal Plan?: No Has patient had any suicidal plan within the past 6 months prior to admission? : No Access to  Means: No What has been your use of drugs/alcohol within the last 12 months?: Current use Previous Attempts/Gestures: Yes How many times?: 2(Per notes) Other Self Harm Risks: NA Triggers for Past Attempts: Unknown Intentional Self Injurious Behavior: None Family Suicide History: No Recent stressful life event(s): Other (Comment)(UNK) Persecutory voices/beliefs?: No Depression: Yes Depression Symptoms: (UTA) Substance abuse history and/or treatment for substance abuse?: No Suicide prevention information given to non-admitted patients: Not applicable  Risk to Others within the past 6 months Homicidal Ideation: No Does patient have any lifetime risk of violence toward others beyond the six months prior to admission? : No Thoughts of Harm to Others: No Current Homicidal Intent: No Current Homicidal Plan: No Access to Homicidal Means: No Identified Victim: NA History of harm to others?: No Assessment of Violence: None Noted Violent Behavior Description: NA Does patient have access to weapons?: No Criminal Charges Pending?: No Does patient have a court date: No Is patient on probation?: No  Psychosis Hallucinations: None noted Delusions: None  noted  Mental Status Report Appearance/Hygiene: In scrubs Eye Contact: Poor Motor Activity: Agitation Speech: Slow, Slurred Level of Consciousness: Irritable Mood: Anxious Affect: Flat Anxiety Level: Moderate Thought Processes: Flight of Ideas Judgement: Impaired Orientation: Unable to assess Obsessive Compulsive Thoughts/Behaviors: None  Cognitive Functioning Concentration: Unable to Assess Memory: Unable to Assess Is patient IDD: No Insight: Unable to Assess Impulse Control: Unable to Assess Appetite: (UTA) Have you had any weight changes? : (UTA) Sleep: (UTA) Total Hours of Sleep: (UTA) Vegetative Symptoms: Unable to Assess  ADLScreening [facility] Assessment Services) Patient's cognitive ability adequate to safely complete  daily activities?: Yes Patient able to express need for assistance with ADLs?: No Independently performs ADLs?: Yes (appropriate for developmental age)  Prior Inpatient Therapy Prior Inpatient Therapy: Yes Prior Therapy Dates: 2019 [facility] Reason for Treatment: MH issues  Prior Outpatient Therapy Prior Outpatient Therapy: No Does patient have an ACCT team?: No Does patient have Intensive In-House Services?  : No Does patient have [facility] services? : No Does patient have P4CC services?: No  ADL Screening (condition at time of admission) Patient's cognitive ability adequate to safely complete daily activities?: Yes Is the patient deaf or have difficulty hearing?: No Does the patient have difficulty seeing, even when wearing glasses/contacts?: No Does the patient have difficulty concentrating, remembering, or making decisions?: Yes Patient able to express need for assistance with ADLs?: No Does the patient have difficulty dressing or bathing?: No Independently performs ADLs?: Yes (appropriate for developmental age) Does the patient have difficulty walking or climbing stairs?: No Weakness of Legs: None Weakness of Arms/Hands: None  Home Assistive Devices/Equipment Home Assistive Devices/Equipment: None  Therapy Consults (therapy consults require a physician order) PT Evaluation Needed: No OT Evalulation Needed: No SLP Evaluation Needed: No Abuse/Neglect Assessment (Assessment to be complete while patient is alone) Physical Abuse: Denies Verbal Abuse: Denies Sexual Abuse: Denies Exploitation of patient/patient's resources: Denies Self-Neglect: Denies Values / Beliefs Cultural Requests During Hospitalization: None Spiritual Requests During Hospitalization: None Consults Spiritual Care Consult Needed: No Social Work Consult Needed: No Merchant navy officerAdvance Directives (For Healthcare) Does Patient Have a Medical Advance Directive?: No Would patient like  information on creating a medical advance directive?: No - Patient declined          Disposition: Case was staffed with Shaune PollackLord DNP who recommended a inpatient admission to assist with stabilization.    Disposition Initial Assessment Completed for this Encounter: Yes Disposition of Patient: Admit Type of inpatient treatment program: Adult Patient refused recommended treatment: No Mode of transportation if patient is discharged/movement?: (Unk)  On Site Evaluation by:   Reviewed with Physician:    Alfredia Fergusonavid L Ladeana Laplant 06/25/2018 3:53 PM

## 2018-06-25 NOTE — BH Assessment (Signed)
[facility] Assessment Progress Note Case was staffed with Lord DNP who recommended a inpatient admission to assist with stabilization.       

## 2018-06-25 NOTE — ED Provider Notes (Signed)
[facility]-EMERGENCY DEPT Provider Note   CSN: [id number] Arrival date & time: 06/25/18  1221     History   Chief Complaint Chief Complaint  Patient presents with  . Suicidal    HPI [name] is a 27 y.o. female.  HPI   27 year old female with depression and suicidal thoughts.  She states that she just does not want to work.  She has been having thoughts of hanging herself and tried last week. She is very tearful. She has a past hx of depression. She cannot tell me why   Past Medical History:  Diagnosis Date  . ADHD (attention deficit hyperactivity disorder)   . Anxiety   . Anxiety disorder   . Constipation   . Depression   . Mental disorder   . Personality disorder (HCC)   . Substance abuse [facility](HCC)     Patient Active Problem List   Diagnosis Date Noted  . MDD (major depressive disorder), recurrent, severe, with psychosis (HCC) 10/31/2017  . Hyperprolactinemia (HCC) 06/17/2015  . Cocaine use disorder (HCC) 06/15/2015  . Schizoaffective disorder, bipolar type (HCC) 06/15/2015  . Cocaine use disorder, severe, dependence (HCC) 06/15/2015  . Alcohol use disorder, moderate, dependence (HCC) 06/15/2015  . PTSD (post-traumatic stress disorder) 06/15/2015  . Cannabis abuse 06/15/2015  . Rectal ulceration 07/08/2014  . Rectal bleeding 07/05/2014  . Rectal bleed 07/05/2014    Past Surgical History:  Procedure Laterality Date  . CESAREAN SECTION    . FLEXIBLE SIGMOIDOSCOPY N/A 07/06/2014   Procedure: FLEXIBLE SIGMOIDOSCOPY;  Surgeon: Florencia Reasonsobert Buccini V, MD;  [facility];  Service: Endoscopy;  Laterality: N/A;  . HERNIA REPAIR       OB History    Gravida  3   Para  3   Term      Preterm  3   AB      Living  3     SAB      TAB      Ectopic      Multiple      Live Births               Home Medications    Prior to Admission medications   Medication Sig Start Date End Date Taking? Authorizing Provider    benztropine (COGENTIN) 1 MG tablet Take 1 tablet (1 mg total) by mouth 2 (two) times daily as needed (EPS). 11/03/17   Nwoko, Nelda MarseilleAgnes I, NP  fluticasone (FLONASE) 50 MCG/ACT nasal spray Place 2 sprays into both nostrils daily as needed for allergies or rhinitis.    [provider]  loratadine (CLARITIN) 10 MG tablet Take 10 mg by mouth daily as needed for allergies.    [provider]  nicotine polacrilex (NICORETTE) 2 MG gum Take 1 each (2 mg total) by mouth as needed for smoking cessation. (May buy from over the counter at the pharmacy): For smoking cessation 11/03/17   Armandina StammerNwoko, Agnes I, NP  QUEtiapine (SEROQUEL) 50 MG tablet Take 3 tablets (150 mg total) by mouth at bedtime. 04/30/18   Oneta RackLewis, Tanika N, NP  sertraline (ZOLOFT) 50 MG tablet Take 1 tablet (50 mg total) by mouth daily. 05/01/18   Oneta RackLewis, Tanika N, NP  triamcinolone cream (KENALOG) 0.1 % Apply topically 2 (two) times daily. For rashes Patient not taking: Reported on 04/26/2018 11/03/17   Sanjuana KavaNwoko, Agnes I, NP    Family History Family History  Problem Relation Age of Onset  . Schizophrenia Mother   . Alcoholism Brother   .  Hypertension Maternal Grandfather   . Diabetes Maternal Grandfather   . Alcohol abuse Neg Hx   . Arthritis Neg Hx   . Asthma Neg Hx   . Birth defects Neg Hx   . Cancer Neg Hx   . COPD Neg Hx   . Depression Neg Hx   . Drug abuse Neg Hx   . Early death Neg Hx   . Hearing loss Neg Hx   . Heart disease Neg Hx   . Hyperlipidemia Neg Hx   . Kidney disease Neg Hx   . Learning disabilities Neg Hx   . Mental illness Neg Hx   . Mental retardation Neg Hx   . Miscarriages / Stillbirths Neg Hx   . Stroke Neg Hx   . Vision loss Neg Hx   . Varicose Veins Neg Hx     Social History Social History   Tobacco Use  . Smoking status: Current Every Day Smoker    Packs/day: 1.00    Types: Cigarettes  . Smokeless tobacco: Never Used  Substance Use Topics  . Alcohol use: Yes    Alcohol/week: 2.0 standard  drinks    Types: 2 Cans of beer per week    Comment: Per pt 1 pint of liquor daily  . Drug use: Yes    Types: Cocaine, "Crack" cocaine, Marijuana     Allergies   Latex; Haldol [haloperidol lactate]; and Risperdal [risperidone]   Review of Systems Review of Systems All systems reviewed and negative, other than as noted in HPI.   Physical Exam Updated Vital Signs BP (!) 149/129 (BP Location: Left Arm)   Pulse 73   Temp (!) 97.3 F (36.3 C) (Oral) Comment: chewig gum  Resp 19   LMP 06/06/2018   SpO2 99%   Physical Exam  Constitutional: She appears well-developed and well-nourished. No distress.  HENT:  Head: Normocephalic and atraumatic.  Eyes: Conjunctivae are normal. Right eye exhibits no discharge. Left eye exhibits no discharge.  Neck: Neck supple.  Cardiovascular: Normal rate, regular rhythm and normal heart sounds. Exam reveals no gallop and no friction rub.  No murmur heard. Pulmonary/Chest: Effort normal and breath sounds normal. No respiratory distress.  Abdominal: Soft. She exhibits no distension. There is no tenderness.  Musculoskeletal: She exhibits no edema or tenderness.  Neurological: She is alert.  Skin: Skin is warm and dry.  Psychiatric:  Flat affect.  Poor eye contact.  Crying.  Nursing note and vitals reviewed.    ED Treatments / Results  Labs (all labs ordered are listed, but only abnormal results are displayed) Labs Reviewed  COMPREHENSIVE METABOLIC PANEL - Abnormal; Notable for the following components:      Result Value   Potassium 3.3 (*)    Total Protein 8.6 (*)    Alkaline Phosphatase 134 (*)    All other components within normal limits  ACETAMINOPHEN LEVEL - Abnormal; Notable for the following components:   Acetaminophen (Tylenol), Serum <10 (*)    All other components within normal limits  ETHANOL  SALICYLATE LEVEL  CBC  RAPID URINE DRUG SCREEN, HOSP PERFORMED  I-STAT BETA HCG BLOOD, ED ([facility], [facility], [facility] ONLY)     EKG None  Radiology No results found.  Procedures Procedures (including critical care time)  Medications Ordered in ED Medications - No data to display   Initial Impression / Assessment and Plan / ED Course  I have reviewed the triage vital signs and the nursing notes.  Pertinent labs & imaging results that  were available during my care of the patient were reviewed by me and considered in my medical decision making (see chart for details).    27 year old female with increasing depression and suicidal thoughts.  She is extremely tearful on exam today.  Will medically cleared.  TTS evaluation.  Final Clinical Impressions(s) / ED Diagnoses   Final diagnoses:  Depression, unspecified depression type  Suicidal thoughts    ED Discharge Orders    None       Raeford Razor, MD 06/25/18 1545

## 2018-06-25 NOTE — Progress Notes (Signed)
[facility] Nanine MeansJamison Lord, NP is the accepting provider.  Dr. Jeannine KittenFarah is the accepting/attending provider.   Call report to [phone] F[facility] KneeSue [facility].    Pt is voluntary and can be transported by [facility].  [facility].   Wells GuilesSarah Daphne Karrer, LCSW, LCAS Disposition CSW [facility]S [phone]

## 2018-06-25 NOTE — ED Notes (Signed)
Patient unable to give a urine sample at this time.

## 2018-06-26 ENCOUNTER — Inpatient Hospital Stay (HOSPITAL_COMMUNITY)
Admission: AD | Admit: 2018-06-26 | Discharge: 2018-06-29 | DRG: 885 | Disposition: A | Discharge status: Home / Self Care | Payer: Medicaid Other | Source: Intra-hospital | Attending: Psychiatry | Admitting: Psychiatry

## 2018-06-26 ENCOUNTER — Other Ambulatory Visit: Payer: Self-pay

## 2018-06-26 ENCOUNTER — Encounter (HOSPITAL_COMMUNITY): Payer: Self-pay | Admitting: *Deleted

## 2018-06-26 DIAGNOSIS — R45851 Suicidal ideations: Secondary | ICD-10-CM | POA: Diagnosis present

## 2018-06-26 DIAGNOSIS — F609 Personality disorder, unspecified: Secondary | ICD-10-CM | POA: Diagnosis present

## 2018-06-26 DIAGNOSIS — Z91040 Latex allergy status: Secondary | ICD-10-CM | POA: Diagnosis not present

## 2018-06-26 DIAGNOSIS — F1210 Cannabis abuse, uncomplicated: Secondary | ICD-10-CM

## 2018-06-26 DIAGNOSIS — F909 Attention-deficit hyperactivity disorder, unspecified type: Secondary | ICD-10-CM | POA: Diagnosis present

## 2018-06-26 DIAGNOSIS — F419 Anxiety disorder, unspecified: Secondary | ICD-10-CM | POA: Diagnosis present

## 2018-06-26 DIAGNOSIS — Z79899 Other long term (current) drug therapy: Secondary | ICD-10-CM

## 2018-06-26 DIAGNOSIS — F250 Schizoaffective disorder, bipolar type: Secondary | ICD-10-CM | POA: Diagnosis present

## 2018-06-26 DIAGNOSIS — F332 Major depressive disorder, recurrent severe without psychotic features: Secondary | ICD-10-CM | POA: Diagnosis present

## 2018-06-26 DIAGNOSIS — Z888 Allergy status to other drugs, medicaments and biological substances status: Secondary | ICD-10-CM

## 2018-06-26 DIAGNOSIS — F1410 Cocaine abuse, uncomplicated: Secondary | ICD-10-CM | POA: Diagnosis present

## 2018-06-26 DIAGNOSIS — Z818 Family history of other mental and behavioral disorders: Secondary | ICD-10-CM

## 2018-06-26 DIAGNOSIS — Z91419 Personal history of unspecified adult abuse: Secondary | ICD-10-CM

## 2018-06-26 DIAGNOSIS — Z7982 Long term (current) use of aspirin: Secondary | ICD-10-CM

## 2018-06-26 DIAGNOSIS — F17210 Nicotine dependence, cigarettes, uncomplicated: Secondary | ICD-10-CM | POA: Diagnosis present

## 2018-06-26 LAB — LIPID PANEL
Cholesterol: 168 mg/dL (ref 0–200)
HDL: 83 mg/dL (ref >40)
LDL Cholesterol: 78 mg/dL (ref 0–99)
Total CHOL/HDL Ratio: 2 RATIO
Triglycerides: 36 mg/dL (ref <150)
VLDL: 7 mg/dL (ref 0–40)

## 2018-06-26 LAB — HEMOGLOBIN A1C
Hgb A1c MFr Bld: 5 % (ref 4.8–5.6)
Mean Plasma Glucose: 96.8 mg/dL

## 2018-06-26 LAB — TSH: TSH: 0.844 u[IU]/mL (ref 0.350–4.500)

## 2018-06-26 MED ORDER — FLUOXETINE HCL 20 MG PO CAPS
20.0000 mg | ORAL_CAPSULE | Freq: Every day | ORAL | Status: DC
  Administered 2018-06-26 – 2018-06-29 (×4): 20 mg via ORAL
  Filled 2018-06-26 (×6): qty 1

## 2018-06-26 MED ORDER — NICOTINE POLACRILEX 2 MG MT GUM
2.0000 mg | CHEWING_GUM | OROMUCOSAL | Status: DC | PRN
  Administered 2018-06-29: 2 mg via ORAL
  Filled 2018-06-26 (×2): qty 1

## 2018-06-26 MED ORDER — TRAZODONE HCL 50 MG PO TABS
50.0000 mg | ORAL_TABLET | Freq: Every evening | ORAL | Status: DC | PRN
  Administered 2018-06-26: 50 mg via ORAL
  Filled 2018-06-26: qty 1

## 2018-06-26 MED ORDER — HYDROXYZINE HCL 25 MG PO TABS
25.0000 mg | ORAL_TABLET | Freq: Three times a day (TID) | ORAL | Status: DC | PRN
  Administered 2018-06-26 – 2018-06-29 (×4): 25 mg via ORAL
  Filled 2018-06-26 (×2): qty 1

## 2018-06-26 MED ORDER — MAGNESIUM HYDROXIDE 400 MG/5ML PO SUSP: 30.0000 mL | Freq: Every day | ORAL | Status: DC | PRN

## 2018-06-26 MED ORDER — ACETAMINOPHEN 325 MG PO TABS: 650.0000 mg | ORAL_TABLET | Freq: Four times a day (QID) | ORAL | Status: DC | PRN

## 2018-06-26 MED ORDER — ALUM & MAG HYDROXIDE-SIMETH 200-200-20 MG/5ML PO SUSP: 30.0000 mL | ORAL | Status: DC | PRN

## 2018-06-26 MED ORDER — SALINE SPRAY 0.65 % NA SOLN
1.0000 | NASAL | Status: DC | PRN
  Administered 2018-06-26: 1 via NASAL
  Filled 2018-06-26: qty 44

## 2018-06-26 MED ORDER — QUETIAPINE FUMARATE ER 200 MG PO TB24
200.0000 mg | ORAL_TABLET | Freq: Every day | ORAL | Status: DC
  Administered 2018-06-26: 200 mg via ORAL
  Filled 2018-06-26 (×2): qty 1
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
## 2018-06-26 NOTE — Progress Notes (Signed)
CSW attempted to meet with patient to complete assessment. Patient sleeping soundly, did not respond to door knock or name being called. Patient arrived after midnight, CSW will attempt to complete assessment later in the day.  Enid Cutterharlotte Jaziah Goeller, MSW, [employer]CSWA Clinical Social Worker

## 2018-06-26 NOTE — Progress Notes (Signed)
Recreation Therapy Notes  Patient admitted to unit 12.3.19. Due to admission within last year, no new assessment conducted at this time. Last assessment conducted 10.4.19. Patient reports her boyfriend as her only stressor.  Pt stated she was having HI towards her boyfriend and rated it as a 10.   Patient denies SI, HI, AVH at this time. Patient reports goal of getting back on medication.  Information found below from assessment conducted 10.4.19:   Coping Skills:  Isolation, Journal, Arguments, Aggression, Music, Exercise, Meditate, Deep Breathing, Substance Abuse, Impulsivity, Art, Avoidance, Read, Dance, Hot Bath/Shower  Leisure Interests:  Nothing  Patient Strengths:  "I don't know"  Areas of Improvement: Addiction, Talk to her kids more     Ichelle Harral Lillia AbedLindsay, LRT/CTRS   Caroll RancherLindsay, Vidal Lampkins A 06/26/2018 1:21 PM
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
## 2018-06-26 NOTE — Progress Notes (Signed)
Patient ID: [name], female   DOB: 09/09/1990, 27 y.o.   MRN: [id number]  D: Patient has a flat affect on approach tonight. Denies any active SI but was passive earlier today. Reports some passive HI toward "someone outside the hospital". Reports hearing voices at times. Started on Seroquel XR tonight. A: Staff will monitor on q 15 minute checks, follow treatment plan, and give medications as ordered. R: Cooperative on the unit
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
## 2018-06-26 NOTE — ED Notes (Signed)
[facility]. Pt belongings given to transport. Pt alert and oriented. Pt stable and denies any discomfort at this time.

## 2018-06-26 NOTE — Plan of Care (Signed)
Progress note  D: pt found in bed; non compliant with medication administration. Pt states she slept well. Pt is reserved and isolative. Pt is guarded with questioning. Pt denies any si/hi/ah/vh and verbally agrees to approach staff if these become apparent or before harming herself [facility]. A: pt provided support and encouragement. Pt provided medication per protocol and standing orders. Q3466m safety checks implemented and continued. R: pt safe on the unit. Will continue to monitor.   Pt progressing in the following metrics  Problem: Education: Goal: Verbalization of understanding the information provided will improve Outcome: Progressing   Problem: Activity: Goal: Sleeping patterns will improve Outcome: Progressing   Problem: Coping: Goal: Ability to demonstrate self-control will improve Outcome: Progressing

## 2018-06-26 NOTE — [facility] Group Notes (Signed)
LCSW Group Therapy Note 06/26/2018 11:38 AM  Type of Therapy and Topic: Group Therapy: DBT House  Participation Level: Did Not Attend   Description of Group:  In this group patients will be encouraged to explore  their values, behaviors they want to change, emotions they wish to increase, protective factors, supports, coping skills, and motivational factors. They will be guided to discuss their thoughts, feelings, and behaviors related to these obstacles. The group will be asked to individually process the activity and share their insights with the group. This group will be process-oriented, with patients participating in exploration of their own experiences as well as giving and receiving support and challenge from other group members.   Therapeutic Goals: 1. Patient will identify their values 2. Patient will identify behaviors they wish to modify  3. Patient will identify feelings and emotions they wish to increase 4. Patient will identify strengths, supports, protective factors 5. Patient will identify coping skills 6. Patient will identify goals and motivating factors for change   Summary of Patient Progress Patient did not attend group.  Therapeutic Modalities:  Dialectical Behavioral Therapy  Motivational Interviewing Relapse Prevention Therapy 

## 2018-06-26 NOTE — Progress Notes (Signed)
Recreation Therapy Notes  Date: 12.3.19 Time: 1000 Location: 500 Hall Dayroom  Group Topic: Self-Esteem  Goal Area(s) Addresses:  Patient will successfully identify positive attributes about themselves.  Patient will successfully identify benefit of improved self-esteem.   Intervention: Paper, colored pencils  Activity: Personalized Plates.  Patients were to create a personalized license plate the highlighted positive characteristics, important dates, things they like or just things that set them apart from everyone else.   Education:  Self-Esteem, Building control surveyorDischarge Planning.   Education Outcome: Acknowledges education/In group clarification offered/Needs additional education  Clinical Observations/Feedback: Pt did not attend group.     Caroll RancherMarjette Shalece Staffa, LRT/CTRS         Caroll RancherLindsay, Kyarah Enamorado A 06/26/2018 11:23 AM
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
## 2018-06-27 MED ORDER — QUETIAPINE FUMARATE ER 50 MG PO TB24
100.0000 mg | ORAL_TABLET | Freq: Every day | ORAL | Status: DC
  Administered 2018-06-27 – 2018-06-28 (×2): 100 mg via ORAL
  Filled 2018-06-27 (×5): qty 2

## 2018-06-27 MED ORDER — PERPHENAZINE 2 MG PO TABS
2.0000 mg | ORAL_TABLET | Freq: Two times a day (BID) | ORAL | Status: DC
  Administered 2018-06-27 – 2018-06-29 (×5): 2 mg via ORAL
  Filled 2018-06-27 (×10): qty 1

## 2018-06-27 NOTE — Progress Notes (Signed)
Recreation Therapy Notes  Date: 12.4.19 Time: 1000 Location: 500 Hall Dayroom  Group Topic: Communication, Team Building, Problem Solving  Goal Area(s) Addresses:  Patient will effectively work with peer towards shared goal.  Patient will identify skill used to make activity successful.  Patient will identify how skills used during activity can be used to reach post d/c goals.   Intervention: STEM Activity   Activity: In team's, using 20 small plastic cups, patients were asked to build the tallest free standing tower possible.    Education: Social Skills, Discharge Planning.   Education Outcome: Acknowledges education/In group clarification offered/Needs additional education.   Clinical Observations/Feedback: Pt did not attend group.     Maha Fischel, LRT/CTRS         Colisha Redler A 06/27/2018 11:54 AM 
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
## 2018-06-27 NOTE — Progress Notes (Signed)
Did not attend group 
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
## 2018-06-28 NOTE — Progress Notes (Signed)
Recreation Therapy Notes  Date: 12.5.19 Time: 1000 Location: 500 Hall Dayroom  Group Topic: Goal Setting  Goal Area(s) Addresses:  Patient will be able to identify at least 3 life goals.  Patient will be able to identify benefit of investing in life goals.  Patient will be able to identify benefit of setting life goals.   Intervention: Worksheet  Activity: Goal Setting.  Patients were to set goals they want to accomplish within the next week, month, year and five years.  Patients were to also identify the obstacles they would face when trying to achieve their goals, what they need in order to be successful and what they could do immediately to get started towards their goal.  Education:  Discharge Planning, Coping Skills   Education Outcome: Acknowledges Education/In Group Clarification Provided/Needs Additional Education  Clinical Observations: Pt did not attend group.     Caroll RancherMarjette Khyri Hinzman, LRT/CTRS       Caroll RancherLindsay, Macall Mccroskey A 06/28/2018 12:13 PM
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
## 2018-06-28 NOTE — Progress Notes (Addendum)
Nursing Progress Note: 7p-7a D: Pt currently presents with a anxious/irritable affect and behavior. Interacting minimally with the milieu. Pt reports fair sleep during the previous night with current medication regimen. Pt did not attend wrap-up group.  A: Pt provided with medications per providers orders. Pt's labs and vitals were monitored throughout the night. Pt supported emotionally and encouraged to express concerns and questions. Pt educated on medications.  R: Pt's safety ensured with 15 minute and environmental checks. Pt currently denies SI, HI, and endorses AVH. Pt verbally contracts to seek staff if SI,HI, or AVH occurs and to consult with staff before acting on any harmful thoughts. Will continue to monitor.
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
## 2018-06-28 NOTE — Progress Notes (Signed)
Nursing Progress Note: 7p-7a D: Pt currently presents with a anxious/irritable affect and behavior. Interacting minimally with the milieu. Pt reports fair sleep during the previous night with current medication regimen. Pt did attend wrap-up group.  A: Pt provided with medications per providers orders. Pt's labs and vitals were monitored throughout the night. Pt supported emotionally and encouraged to express concerns and questions. Pt educated on medications.  R: Pt's safety ensured with 15 minute and environmental checks. Pt currently denies SI, HI, and endorses AVH. Pt verbally contracts to seek staff if SI,HI, or AVH occurs and to consult with staff before acting on any harmful thoughts. Will continue to monitor.

## 2018-06-29 MED ORDER — PERPHENAZINE 2 MG PO TABS: 2.0000 mg | ORAL_TABLET | Freq: Two times a day (BID) | ORAL | 0 refills | Status: DC

## 2018-06-29 MED ORDER — QUETIAPINE FUMARATE ER 50 MG PO TB24: 100.0000 mg | ORAL_TABLET | Freq: Every day | ORAL | 0 refills | Status: DC

## 2018-06-29 MED ORDER — FLUOXETINE HCL 20 MG PO CAPS: 20.0000 mg | ORAL_CAPSULE | Freq: Every day | ORAL | 0 refills | Status: DC

## 2018-06-29 MED ORDER — HYDROXYZINE HCL 25 MG PO TABS: 25.0000 mg | ORAL_TABLET | Freq: Three times a day (TID) | ORAL | 0 refills | Status: DC | PRN

## 2018-06-29 MED ORDER — SALINE SPRAY 0.65 % NA SOLN: 1.0000 | NASAL | 0 refills | Status: DC | PRN

## 2018-06-29 MED ORDER — NICOTINE POLACRILEX 2 MG MT GUM: 2.0000 mg | CHEWING_GUM | OROMUCOSAL | 0 refills | Status: DC | PRN

## 2018-06-29 NOTE — [facility] Suicide Risk Assessment (Signed)
[facility]   Principal Problem: Chronic substance abuse/poor compliance/affect of instability Discharge Diagnoses: Active Problems:   Severe recurrent major depression without psychotic features (HCC)   Total Time spent with patient: 30 minutes  Musculoskeletal: Strength & Muscle Tone: within normal limits Gait & Station: normal Patient leans: N/A  Psychiatric Specialty Exam: ROS  Blood pressure 132/87, pulse 89, temperature 98.3 F (36.8 C), temperature source Oral, resp. rate 18, height 5\' 5"  (1.651 m), weight 72.6 kg, last menstrual period 06/06/2018, SpO2 100 %.Body mass index is 26.63 kg/m.  General Appearance: Casual  Eye Contact::  Fair  Speech:  Clear and Coherent409  Volume:  Decreased  Mood:  Dysphoric  Affect:  Flat  Thought Process:  Coherent  Orientation:  Full (Time, Place, and Person)  Thought Content:  Logical  Suicidal Thoughts:  No  Homicidal Thoughts:  No  Memory:  NA  Judgement:  Fair  Insight:  Good  Psychomotor Activity:  Normal  Concentration:  Good  Recall:  Good  Fund of Knowledge:Good  Language: Good  Akathisia:  Negative  Handed:  Right  AIMS (if indicated):     Assets:  Communication Skills  Sleep:  Number of Hours: 6.25  Cognition: WNL  ADL's:  Intact   Mental Status Per Nursing Assessment::   On Admission:  Thoughts of violence towards others, Self-harm thoughts, Self-harm behaviors  Demographic Factors:  Low socioeconomic status  Loss Factors: Decrease in vocational status  Historical Factors: Impulsivity  Risk Reduction Factors:   Religious beliefs about death  Continued Clinical Symptoms:  Dysthymia  Cognitive Features That Contribute To Risk:  None    Suicide Risk:  Minimal: No identifiable suicidal ideation.  Patients presenting with no risk factors but with morbid ruminations; may be classified as minimal risk based on the severity of the depressive symptoms  Follow-up Information    [facility] on 07/09/2018.   Why:  Your next hospital appointment is Monday, 07/09/18 at 8:30a. Please bring: photo ID, proof of insurance, social security card, and any discharge paperwork from this hospitalization.  Contact information: [address] [place] [id number] [phone]           Plan Of Care/Follow-up recommendations:  Activity:  nl  Malvin JohnsFARAH,Campbell Agramonte, MD 06/29/2018, 9:33 AM
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
## 2018-06-29 NOTE — Progress Notes (Signed)
  [facility] Discharge [facility] :  Will you be returning to the same living situation after discharge:  Yes,  home At discharge, do you have transportation home?: Yes,  bus Do you have the ability to pay for your medications: Yes,  [place] medicaid  Release of information consent forms completed and submitted to medical records by  CSW.   Patient to Follow up at: Follow-up Information    [facility]. Go on 07/09/2018.   Why:  Your next hospital appointment is Monday, 07/09/18 at 8:30a. Please bring: photo ID, proof of insurance, social security card, and any discharge paperwork from this hospitalization.  Contact information: [address] [place] [id number] [phone]          Next level of care provider has access to [facility] Link:no  Safety Planning and Suicide Prevention discussed: Yes,  SPE completed with pt; pt declined to consent to collateral contact. SPI pamphlet and mobile crisis information provided to pt.   Have you used any form of tobacco in the last 30 days? (Cigarettes, Smokeless Tobacco, Cigars, and/or Pipes): Yes  Has patient been referred to the Quitline?: Patient refused referral  Patient has been referred for addiction treatment: Yes  Rona RavensHeather S Hend Mccarrell, LCSW 06/29/2018, 11:54 AM

## 2018-06-29 NOTE — [facility] Suicide Risk Assessment (Signed)
[facility] INPATIENT:  Family/Significant Other Suicide Prevention Education  Suicide Prevention Education:  Patient Refusal for Family/Significant Other Suicide Prevention Education: The patient [name] has refused to provide written consent for family/significant other to be provided Family/Significant Other Suicide Prevention Education during admission and/or prior to discharge.  Physician notified.  SPE completed with pt, as pt refused to consent to family contact. SPI pamphlet provided to pt and pt was encouraged to share information with support network, ask questions, and talk about any concerns relating to SPE. Pt denies access to guns/firearms and verbalized understanding of information provided. Mobile Crisis information also provided to pt.   Rona RavensHeather S Draylen Lobue LCSW 06/29/2018, 11:53 AM

## 2018-06-29 NOTE — Progress Notes (Signed)
Patient discharged to home/self care on her own.  Patient acknowledged understanding of all discharge instructions. Patient denies SI, HI and AVH.  Patient had a bright affect upon discharge.

## 2018-06-29 NOTE — Progress Notes (Signed)
Adult Psychoeducational Group Note  Date:  06/29/2018 Time:  2:09 AM  Group Topic/Focus:  Wrap-Up Group:   The focus of this group is to help patients review their daily goal of treatment and discuss progress on daily workbooks.  Participation Level:  Active  Participation Quality:  Appropriate  Affect:  Appropriate  Cognitive:  Appropriate  Insight: Appropriate  Engagement in Group:  Engaged  Modes of Intervention:  Discussion  Additional Comments:  Pt stated her goal for today was to focus on her treatment plan. Pt stated she felt she accomplished her goal today. Pt rated her over all day a 8 out of 10. Pt stated she did not attend any groups held today but planned to attend all groups held tomorrow. Pt stated talking with her social worker and been able to set up her after care placement today help improve her day.  Felipa FurnaceChristopher  Marra Fraga 06/29/2018, 2:09 AM

## 2018-07-30 ENCOUNTER — Emergency Department (HOSPITAL_COMMUNITY): Payer: Medicaid Other

## 2018-07-30 ENCOUNTER — Emergency Department (HOSPITAL_COMMUNITY)
Admission: EM | Admit: 2018-07-30 | Discharge: 2018-07-30 | Disposition: A | Discharge status: Home / Self Care | Payer: Medicaid Other | Attending: Emergency Medicine | Admitting: Emergency Medicine

## 2018-07-30 ENCOUNTER — Encounter (HOSPITAL_COMMUNITY): Payer: Self-pay | Admitting: Emergency Medicine

## 2018-07-30 ENCOUNTER — Other Ambulatory Visit: Payer: Self-pay

## 2018-07-30 DIAGNOSIS — Y939 Activity, unspecified: Secondary | ICD-10-CM | POA: Diagnosis not present

## 2018-07-30 DIAGNOSIS — F17210 Nicotine dependence, cigarettes, uncomplicated: Secondary | ICD-10-CM | POA: Insufficient documentation

## 2018-07-30 DIAGNOSIS — S0990XA Unspecified injury of head, initial encounter: Secondary | ICD-10-CM

## 2018-07-30 DIAGNOSIS — M542 Cervicalgia: Secondary | ICD-10-CM

## 2018-07-30 DIAGNOSIS — Y929 Unspecified place or not applicable: Secondary | ICD-10-CM | POA: Insufficient documentation

## 2018-07-30 DIAGNOSIS — Y999 Unspecified external cause status: Secondary | ICD-10-CM | POA: Diagnosis not present

## 2018-07-30 LAB — CBC
HCT: 37.4 % (ref 36.0–46.0)
Hemoglobin: 12.3 g/dL (ref 12.0–15.0)
MCH: 29.1 pg (ref 26.0–34.0)
MCHC: 32.9 g/dL (ref 30.0–36.0)
MCV: 88.6 fL (ref 80.0–100.0)
NRBC: 0 % (ref 0.0–0.2)
Platelets: 354 10*3/uL (ref 150–400)
RBC: 4.22 MIL/uL (ref 3.87–5.11)
RDW: 12.2 % (ref 11.5–15.5)
WBC: 9.3 10*3/uL (ref 4.0–10.5)

## 2018-07-30 LAB — BASIC METABOLIC PANEL
ANION GAP: 10 (ref 5–15)
BUN: 7 mg/dL (ref 6–20)
CALCIUM: 8.9 mg/dL (ref 8.9–10.3)
CO2: 24 mmol/L (ref 22–32)
Chloride: 103 mmol/L (ref 98–111)
Creatinine, Ser: 0.88 mg/dL (ref 0.44–1.00)
GFR calc Af Amer: >60 mL/min (ref >60)
GFR calc non Af Amer: >60 mL/min (ref >60)
Glucose, Bld: 83 mg/dL (ref 70–99)
Potassium: 3.9 mmol/L (ref 3.5–5.1)
Sodium: 137 mmol/L (ref 135–145)

## 2018-07-30 LAB — I-STAT BETA HCG BLOOD, ED (MC, WL, AP ONLY): I-stat hCG, quantitative: <5 m[IU]/mL (ref <5)

## 2018-07-30 MED ORDER — IOPAMIDOL (ISOVUE-370) INJECTION 76%
75.0000 mL | Freq: Once | INTRAVENOUS | Status: AC | PRN
  Administered 2018-07-30: 75 mL via INTRAVENOUS

## 2018-07-30 MED ORDER — HYDROCODONE-ACETAMINOPHEN 5-325 MG PO TABS
1.0000 | ORAL_TABLET | Freq: Once | ORAL | Status: AC
  Administered 2018-07-30: 1 via ORAL
  Filled 2018-07-30: qty 1

## 2018-07-30 MED ORDER — IOPAMIDOL (ISOVUE-370) INJECTION 76%
INTRAVENOUS | Status: AC
  Filled 2018-07-30: qty 100
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
## 2018-07-30 NOTE — Care Management (Signed)
ED CM contacted by [facility] ED CSW concerning patient needing a taxi voucher, taxi voucher provided, no further ED CM needs identified.

## 2018-07-30 NOTE — ED Notes (Signed)
Patient transported to CT 

## 2018-07-30 NOTE — ED Notes (Signed)
Spoke with Doctor who assessed patient for orders during triage.
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
## 2018-07-30 NOTE — Progress Notes (Signed)
CSW received a call from pt's advocate stating a placement has been found at a [facility] and advocate is requesting assistance with transportation.  CSW requested assistance from the [facility] ED RN CM who was able to provide a taxi voucher for the pt as the pt had to travel alone to the shelter from the hospital.  CSW will continue to follow for D/C needs.  Dorothe Pea. Aylla Huffine, LCSW, LCAS, CSI Clinical Social Worker Ph: [phone]
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
## 2018-09-01 ENCOUNTER — Encounter (HOSPITAL_COMMUNITY): Payer: Self-pay | Admitting: *Deleted

## 2018-09-01 ENCOUNTER — Emergency Department (HOSPITAL_COMMUNITY)
Admission: EM | Admit: 2018-09-01 | Discharge: 2018-09-03 | Disposition: A | Discharge status: Other Acute Inpatient Hospital | Payer: Medicaid Other | Attending: Emergency Medicine | Admitting: Emergency Medicine

## 2018-09-01 ENCOUNTER — Other Ambulatory Visit: Payer: Self-pay

## 2018-09-01 ENCOUNTER — Ambulatory Visit (HOSPITAL_COMMUNITY)
Admission: RE | Admit: 2018-09-01 | Discharge: 2018-09-01 | Disposition: A | Discharge status: Home / Self Care | Payer: Medicaid Other | Attending: Psychiatry | Admitting: Psychiatry

## 2018-09-01 DIAGNOSIS — R441 Visual hallucinations: Secondary | ICD-10-CM | POA: Insufficient documentation

## 2018-09-01 DIAGNOSIS — Z79899 Other long term (current) drug therapy: Secondary | ICD-10-CM | POA: Insufficient documentation

## 2018-09-01 DIAGNOSIS — Z811 Family history of alcohol abuse and dependence: Secondary | ICD-10-CM | POA: Insufficient documentation

## 2018-09-01 DIAGNOSIS — F1420 Cocaine dependence, uncomplicated: Secondary | ICD-10-CM | POA: Diagnosis not present

## 2018-09-01 DIAGNOSIS — Z91040 Latex allergy status: Secondary | ICD-10-CM | POA: Insufficient documentation

## 2018-09-01 DIAGNOSIS — F251 Schizoaffective disorder, depressive type: Secondary | ICD-10-CM | POA: Diagnosis not present

## 2018-09-01 DIAGNOSIS — Z833 Family history of diabetes mellitus: Secondary | ICD-10-CM | POA: Diagnosis not present

## 2018-09-01 DIAGNOSIS — Z818 Family history of other mental and behavioral disorders: Secondary | ICD-10-CM | POA: Diagnosis not present

## 2018-09-01 DIAGNOSIS — Z888 Allergy status to other drugs, medicaments and biological substances status: Secondary | ICD-10-CM | POA: Insufficient documentation

## 2018-09-01 DIAGNOSIS — Z7289 Other problems related to lifestyle: Secondary | ICD-10-CM | POA: Diagnosis not present

## 2018-09-01 DIAGNOSIS — F1020 Alcohol dependence, uncomplicated: Secondary | ICD-10-CM | POA: Diagnosis present

## 2018-09-01 DIAGNOSIS — R45851 Suicidal ideations: Secondary | ICD-10-CM | POA: Insufficient documentation

## 2018-09-01 DIAGNOSIS — F17210 Nicotine dependence, cigarettes, uncomplicated: Secondary | ICD-10-CM | POA: Diagnosis not present

## 2018-09-01 DIAGNOSIS — F419 Anxiety disorder, unspecified: Secondary | ICD-10-CM | POA: Diagnosis not present

## 2018-09-01 DIAGNOSIS — F250 Schizoaffective disorder, bipolar type: Secondary | ICD-10-CM | POA: Diagnosis present

## 2018-09-01 LAB — CBC
HEMATOCRIT: 35.5 % — AB (ref 36.0–46.0)
Hemoglobin: 11.3 g/dL — ABNORMAL LOW (ref 12.0–15.0)
MCH: 29.6 pg (ref 26.0–34.0)
MCHC: 31.8 g/dL (ref 30.0–36.0)
MCV: 92.9 fL (ref 80.0–100.0)
Platelets: 351 10*3/uL (ref 150–400)
RBC: 3.82 MIL/uL — ABNORMAL LOW (ref 3.87–5.11)
RDW: 13.1 % (ref 11.5–15.5)
WBC: 8.9 10*3/uL (ref 4.0–10.5)
nRBC: 0 % (ref 0.0–0.2)

## 2018-09-01 LAB — COMPREHENSIVE METABOLIC PANEL
ALT: 17 U/L (ref 0–44)
AST: 22 U/L (ref 15–41)
Albumin: 4.3 g/dL (ref 3.5–5.0)
Alkaline Phosphatase: 115 U/L (ref 38–126)
Anion gap: 10 (ref 5–15)
BUN: 10 mg/dL (ref 6–20)
CO2: 24 mmol/L (ref 22–32)
Calcium: 8.7 mg/dL — ABNORMAL LOW (ref 8.9–10.3)
Chloride: 106 mmol/L (ref 98–111)
Creatinine, Ser: 0.93 mg/dL (ref 0.44–1.00)
GFR calc Af Amer: >60 mL/min (ref >60)
GFR calc non Af Amer: >60 mL/min (ref >60)
Glucose, Bld: 75 mg/dL (ref 70–99)
Potassium: 3 mmol/L — ABNORMAL LOW (ref 3.5–5.1)
Sodium: 140 mmol/L (ref 135–145)
Total Bilirubin: 0.3 mg/dL (ref 0.3–1.2)
Total Protein: 8 g/dL (ref 6.5–8.1)

## 2018-09-01 LAB — RAPID URINE DRUG SCREEN, HOSP PERFORMED
Amphetamines: NOT DETECTED
Barbiturates: NOT DETECTED
Benzodiazepines: NOT DETECTED
Cocaine: POSITIVE — AB
Opiates: NOT DETECTED
Tetrahydrocannabinol: NOT DETECTED

## 2018-09-01 LAB — ACETAMINOPHEN LEVEL: Acetaminophen (Tylenol), Serum: <10 ug/mL — ABNORMAL LOW (ref 10–30)

## 2018-09-01 LAB — ETHANOL: Alcohol, Ethyl (B): <10 mg/dL (ref <10)

## 2018-09-01 LAB — I-STAT BETA HCG BLOOD, ED (MC, WL, AP ONLY): I-stat hCG, quantitative: <5 m[IU]/mL (ref <5)

## 2018-09-01 LAB — SALICYLATE LEVEL

## 2018-09-01 MED ORDER — TRAZODONE HCL 50 MG PO TABS: 50.0000 mg | ORAL_TABLET | Freq: Every day | ORAL | Status: DC

## 2018-09-01 MED ORDER — HYDROXYZINE HCL 25 MG PO TABS: 25.0000 mg | ORAL_TABLET | Freq: Three times a day (TID) | ORAL | Status: DC | PRN

## 2018-09-01 MED ORDER — QUETIAPINE FUMARATE ER 50 MG PO TB24
100.0000 mg | ORAL_TABLET | Freq: Every day | ORAL | Status: DC
  Administered 2018-09-01: 100 mg via ORAL
  Filled 2018-09-01: qty 2

## 2018-09-01 MED ORDER — IBUPROFEN 200 MG PO TABS: 600.0000 mg | ORAL_TABLET | Freq: Every day | ORAL | Status: DC | PRN

## 2018-09-01 MED ORDER — FLUOXETINE HCL 20 MG PO CAPS
20.0000 mg | ORAL_CAPSULE | Freq: Every day | ORAL | Status: DC
  Administered 2018-09-02: 20 mg via ORAL
  Filled 2018-09-01: qty 1

## 2018-09-01 MED ORDER — POTASSIUM CHLORIDE CRYS ER 20 MEQ PO TBCR
40.0000 meq | EXTENDED_RELEASE_TABLET | Freq: Once | ORAL | Status: AC
  Administered 2018-09-02: 40 meq via ORAL
  Filled 2018-09-01: qty 2

## 2018-09-01 NOTE — [facility] Counselor (Signed)
Pt was assessed at [facility] as a walk-in. TTS to seek inpatient treatment.    Redmond Pullingreylese D Lisl Slingerland, MS, [facility], [facility] Triage Specialist [phone]

## 2018-09-01 NOTE — ED Provider Notes (Signed)
[facility]-EMERGENCY DEPT Provider Note   CSN: [id number] Arrival date & time: 09/01/18  1826     History   Chief Complaint Chief Complaint  Patient presents with  . Suicidal    HPI [name] is a 28 y.o. female.  The history is provided by the patient and medical records. No language interpreter was used.   [name] is a 28 y.o. female  with a PMH of schizoaffective disorder, depression, PTSD who presents to the Emergency Department complaining of suicidal thoughts with plan to hang herself.  Patient states that she did not act on it today, but has attempted suicide in the past.  She states that she has a son who just turned 6 on February 4 and could not get back to [facility] for his birthday.  Since missing her son's birthday, she has been feeling very depressed and no longer wants to live.  She endorses visual hallucinations off and on since she was young.  Currently, she sees to faces who are in the room with her right now.  Denies any auditory hallucinations.  When asked if she has having any homicidal thoughts, she states "I do not want to say yes, but I do not want to say no".  She has been compliant with her mental health medications, but do not feel like they are working anymore and feels as if they need to be adjusted.  She is requesting inpatient treatment.   Past Medical History:  Diagnosis Date  . ADHD (attention deficit hyperactivity disorder)   . Anxiety   . Anxiety disorder   . Constipation   . Depression   . Mental disorder   . Personality disorder (HCC)   . Substance abuse [facility])     Patient Active Problem List   Diagnosis Date Noted  . Severe recurrent major depression without psychotic features (HCC) 06/26/2018  . MDD (major depressive disorder), recurrent, severe, with psychosis (HCC) 10/31/2017  . Hyperprolactinemia (HCC) 06/17/2015  . Cocaine use disorder (HCC) 06/15/2015  . Schizoaffective disorder, bipolar type (HCC)  06/15/2015  . Cocaine use disorder, severe, dependence (HCC) 06/15/2015  . Alcohol use disorder, moderate, dependence (HCC) 06/15/2015  . PTSD (post-traumatic stress disorder) 06/15/2015  . Cannabis abuse 06/15/2015  . Rectal ulceration 07/08/2014  . Rectal bleeding 07/05/2014  . Rectal bleed 07/05/2014    Past Surgical History:  Procedure Laterality Date  . CESAREAN SECTION    . FLEXIBLE SIGMOIDOSCOPY N/A 07/06/2014   Procedure: FLEXIBLE SIGMOIDOSCOPY;  Surgeon: Florencia Reasons, MD;  Location: [facility] ENDOSCOPY;  Service: Endoscopy;  Laterality: N/A;  . HERNIA REPAIR       OB History    Gravida  3   Para  3   Term      Preterm  3   AB      Living  3     SAB      TAB      Ectopic      Multiple      Live Births               Home Medications    Prior to Admission medications   Medication Sig Start Date End Date Taking? Authorizing Provider  diphenhydrAMINE (BENADRYL) 25 MG tablet Take 150 mg by mouth once as needed (allergic reaction).    [provider]  etonogestrel (NEXPLANON) 68 MG IMPL implant 1 each by Subdermal route once.    [provider]  FLUoxetine (PROZAC) 20 MG capsule  Take 1 capsule (20 mg total) by mouth daily. For depression Patient taking differently: Take 20 mg by mouth daily after lunch. For depression 06/29/18   Armandina StammerNwoko, Agnes I, NP  hydrOXYzine (ATARAX/VISTARIL) 25 MG tablet Take 1 tablet (25 mg total) by mouth 3 (three) times daily as needed for anxiety. Patient taking differently: Take 25 mg by mouth at bedtime.  06/29/18   Armandina StammerNwoko, Agnes I, NP  ibuprofen (ADVIL,MOTRIN) 200 MG tablet Take 600 mg by mouth daily as needed for cramping (pain).    [provider]  nicotine polacrilex (NICORETTE) 2 MG gum Take 1 each (2 mg total) by mouth as needed for smoking cessation. (May buy from over the counter): For smoking cessation Patient not taking: Reported on 07/30/2018 06/29/18   Armandina StammerNwoko, Agnes I, NP  perphenazine (TRILAFON) 2  MG tablet Take 1 tablet (2 mg total) by mouth 2 (two) times daily. For mood control Patient not taking: Reported on 07/30/2018 06/29/18   Armandina StammerNwoko, Agnes I, NP  QUEtiapine (SEROQUEL XR) 50 MG TB24 24 hr tablet Take 2 tablets (100 mg total) by mouth at bedtime. For mood control 06/29/18   Armandina StammerNwoko, Agnes I, NP  sodium chloride (OCEAN) 0.65 % SOLN nasal spray Place 1 spray into both nostrils as needed for congestion. Patient not taking: Reported on 07/30/2018 06/29/18   Armandina StammerNwoko, Agnes I, NP  traZODone (DESYREL) 50 MG tablet Take 50-200 mg by mouth at bedtime.    [provider]    Family History Family History  Problem Relation Age of Onset  . Schizophrenia Mother   . Alcoholism Brother   . Hypertension Maternal Grandfather   . Diabetes Maternal Grandfather   . Alcohol abuse Neg Hx   . Arthritis Neg Hx   . Asthma Neg Hx   . Birth defects Neg Hx   . Cancer Neg Hx   . COPD Neg Hx   . Depression Neg Hx   . Drug abuse Neg Hx   . Early death Neg Hx   . Hearing loss Neg Hx   . Heart disease Neg Hx   . Hyperlipidemia Neg Hx   . Kidney disease Neg Hx   . Learning disabilities Neg Hx   . Mental illness Neg Hx   . Mental retardation Neg Hx   . Miscarriages / Stillbirths Neg Hx   . Stroke Neg Hx   . Vision loss Neg Hx   . Varicose Veins Neg Hx     Social History Social History   Tobacco Use  . Smoking status: Current Every Day Smoker    Packs/day: 1.00    Types: Cigarettes  . Smokeless tobacco: Never Used  Substance Use Topics  . Alcohol use: Yes    Alcohol/week: 2.0 standard drinks    Types: 2 Cans of beer per week    Comment: Per pt 1 pint of liquor daily  . Drug use: Yes    Types: Cocaine, "Crack" cocaine, Marijuana     Allergies   Perphenazine; Latex; Haldol [haloperidol lactate]; and Risperdal [risperidone]   Review of Systems Review of Systems  Psychiatric/Behavioral: Positive for hallucinations and suicidal ideas.  All other systems reviewed and are  negative.    Physical Exam Updated Vital Signs BP 105/70 (BP Location: Left Arm)   Pulse 73   Temp 98.1 F (36.7 C) (Oral)   Resp 20   Ht 5\' 5"  (1.651 m)   Wt 71.7 kg   SpO2 100%   BMI 26.29 kg/m   Physical  Exam Vitals signs and nursing note reviewed.  Constitutional:      General: She is not in acute distress.    Appearance: She is well-developed.  HENT:     Head: Normocephalic and atraumatic.  Neck:     Musculoskeletal: Neck supple.  Cardiovascular:     Rate and Rhythm: Normal rate and regular rhythm.     Heart sounds: Normal heart sounds. No murmur.  Pulmonary:     Effort: Pulmonary effort is normal. No respiratory distress.     Breath sounds: Normal breath sounds.  Abdominal:     General: There is no distension.     Palpations: Abdomen is soft.     Tenderness: There is no abdominal tenderness.  Skin:    General: Skin is warm and dry.  Neurological:     Mental Status: She is alert and oriented to person, place, and time.      ED Treatments / Results  Labs (all labs ordered are listed, but only abnormal results are displayed) Labs Reviewed  COMPREHENSIVE METABOLIC PANEL  ETHANOL  SALICYLATE LEVEL  ACETAMINOPHEN LEVEL  CBC  RAPID URINE DRUG SCREEN, HOSP PERFORMED  I-STAT BETA HCG BLOOD, ED ([facility], [facility], [facility] ONLY)    EKG None  Radiology No results found.  Procedures Procedures (including critical care time)  Medications Ordered in ED Medications - No data to display   Initial Impression / Assessment and Plan / ED Course  I have reviewed the triage vital signs and the nursing notes.  Pertinent labs & imaging results that were available during my care of the patient were reviewed by me and considered in my medical decision making (see chart for details).    Olivia RoesStanlesha Plemons is a 28 y.o. female who presents to ED for suicidal thoughts with plan to hang herself as well as visual hallucinations. Labs reviewed and notable for hypokalemia at 3.0 which  was replenished in the emergency department.  Baseline anemia.  Medically cleared with disposition per TTS recommendations.  Final Clinical Impressions(s) / ED Diagnoses   Final diagnoses:  Suicidal thoughts  Visual hallucinations    ED Discharge Orders    None       , Chase PicketJaime Pilcher, PA-C 09/01/18 2233    Tilden Fossaees, Elizabeth, MD 09/02/18 [phone]

## 2018-09-01 NOTE — ED Notes (Signed)
Bed: WTR5 Expected date:  Expected time:  Means of arrival:  Comments: 

## 2018-09-01 NOTE — ED Notes (Signed)
Pt unable to remove ring from right hand, 4th digit.

## 2018-09-01 NOTE — BH Assessment (Signed)
Assessment Note  [name] is an 28 y.o. female, who reports asking Boy Friend Mother to transport her to [facility].  Patient presented orientated x4, mood "depressed", affect anxious and depressed.  Patient reports current SI with a plan to hang herself.  She denied current HI.  Patient reports experiencing VH of seeing people with pale faces "a lot" and AH of whispering voices.  Patient report last snorting powdered cocaine this morning and was very restless and anxious.  She acknowledged being in withdrawal from cocaine.  Patient reports snorting cocaine since age of 17 years and currently snorts about $50 worth daily if she has the money.  Patient also reports consuming Four Loko of 2 (24 oz) cans daily.  She reports using cocaine to get "high" and then using alcohol "to bring me down."  Patient reports going to [facility] after last discharge from [facility] in December 2019, however only went 1x and then stopped going and taking medication.  Patient reports being hospitalized in [place], [place] on 07-19-2018 for 7 days.  Patient was also hospitalized at [facility] in 06-2018, 04-2018, 10-2017, and 05-2018 for Schizoaffective DO, MDD, PTSD, and ubstance induced mood disorder.    Per Assunta Found, NP;  Patient meets inpatient criteria.  Per [facility] Berneice Heinrich;  Patient will be accepted to [facility] after medical clearance.  WLED Charge Darl Pikes  notified of the Patient coming for medical clearance.     Diagnosis:  Schizoaffective Disorder, depressive type; Stimulant (Cocaine) Use Disorder, severe; Alcohol Use Disorder, severe   Past Medical History:  Past Medical History:  Diagnosis Date  . ADHD (attention deficit hyperactivity disorder)   . Anxiety   . Anxiety disorder   . Constipation   . Depression   . Mental disorder   . Personality disorder (HCC)   . Substance abuse [facility])     Past Surgical History:  Procedure Laterality Date  . CESAREAN SECTION    . FLEXIBLE SIGMOIDOSCOPY N/A 07/06/2014   Procedure: FLEXIBLE  SIGMOIDOSCOPY;  Surgeon: Florencia Reasons, MD;  Location: [facility] ENDOSCOPY;  Service: Endoscopy;  Laterality: N/A;  . HERNIA REPAIR      Family History:  Family History  Problem Relation Age of Onset  . Schizophrenia Mother   . Alcoholism Brother   . Hypertension Maternal Grandfather   . Diabetes Maternal Grandfather   . Alcohol abuse Neg Hx   . Arthritis Neg Hx   . Asthma Neg Hx   . Birth defects Neg Hx   . Cancer Neg Hx   . COPD Neg Hx   . Depression Neg Hx   . Drug abuse Neg Hx   . Early death Neg Hx   . Hearing loss Neg Hx   . Heart disease Neg Hx   . Hyperlipidemia Neg Hx   . Kidney disease Neg Hx   . Learning disabilities Neg Hx   . Mental illness Neg Hx   . Mental retardation Neg Hx   . Miscarriages / Stillbirths Neg Hx   . Stroke Neg Hx   . Vision loss Neg Hx   . Varicose Veins Neg Hx     Social History:  reports that she has been smoking cigarettes. She has been smoking about 1.00 pack per day. She has never used smokeless tobacco. She reports current alcohol use of about 2.0 standard drinks of alcohol per week. She reports current drug use. Drugs: Cocaine and "Crack" cocaine.  Additional Social History:  Substance #1 Name of Substance 1: Powdered Cocaine 1 -  Age of First Use: Since age 81 years 1 - Amount (size/oz): $50 worth per occassion 1 - Frequency: daily if have money 1 - Last Use / Amount: This morning/$50 worth Substance #2 Name of Substance 2: Four Loko 2 - Age of First Use: 9 years 2 - Frequency: 2, (24 oz) 2 - Duration: ongoing 2 - Last Use / Amount: 08/31/2018/1 24oz can  CIWA: CIWA-Ar BP: (!) 146/95 Pulse Rate: 81 COWS:    Allergies:  Allergies  Allergen Reactions  . Perphenazine Swelling    "Tongue swelling" See 12/27 H&P  . Latex Hives and Swelling    Patient states vaginal swelling occurs if a latex condom was used.  . Haldol [Haloperidol Lactate] Swelling  . Risperdal [Risperidone] Swelling    Home Medications: (Not in a hospital  admission)   OB/GYN Status:  No LMP recorded.  General Assessment Data Location of Assessment: [facility] Assessment Services TTS Assessment: In system Is this a Tele or Face-to-Face Assessment?: Face-to-Face Is this an Initial Assessment or a Re-assessment for this encounter?: Initial Assessment Patient Accompanied by:: N/A Language Other than English: No What gender do you identify as?: Female Marital status: Single Maiden name: Threats Pregnancy Status: No Living Arrangements: Other relatives(Live with Boy Friend Mother and Boy Friend) Can pt return to current living arrangement?: Yes Admission Status: Voluntary Is patient capable of signing voluntary admission?: Yes Referral Source: Self/Family/Friend Insurance type: Medicaid  Medical Screening Exam [facility] Walk-in ONLY) Medical Exam completed: No Reason for MSE not completed: Other:([facility] walk in)  Crisis Care Plan Living Arrangements: Other relatives(Live with Boy Friend Mother and Boy Friend) Legal Guardian: Other:(Self) Name of Psychiatrist: Vesta Mixer Name of Therapist: None  Education Status Is patient currently in school?: No Is the patient employed, unemployed or receiving disability?: Receiving disability income(SSDI)  Risk to self with the past 6 months Suicidal Ideation: Yes-Currently Present Has patient been a risk to self within the past 6 months prior to admission? : Yes Suicidal Intent: Yes-Currently Present Has patient had any suicidal intent within the past 6 months prior to admission? : Yes Is patient at risk for suicide?: Yes Suicidal Plan?: Yes-Currently Present Has patient had any suicidal plan within the past 6 months prior to admission? : Yes Specify Current Suicidal Plan: hang self Access to Means: No What has been your use of drugs/alcohol within the last 12 months?: powdered cocaine and alcohol Previous Attempts/Gestures: No How many times?: 0 Triggers for Past Attempts: None known Intentional Self  Injurious Behavior: None Family Suicide History: Unknown Recent stressful life event(s): Other (Comment)(chronic substance use  and AVH) Persecutory voices/beliefs?: No Depression: Yes Depression Symptoms: Feeling worthless/self pity, Loss of interest in usual pleasures Substance abuse history and/or treatment for substance abuse?: Yes(cocaine and alcohol) Suicide prevention information given to non-admitted patients: Not applicable  Risk to Others within the past 6 months Homicidal Ideation: No Does patient have any lifetime risk of violence toward others beyond the six months prior to admission? : No Thoughts of Harm to Others: No Current Homicidal Intent: No Current Homicidal Plan: No Access to Homicidal Means: No History of harm to others?: No Assessment of Violence: None Noted Does patient have access to weapons?: No Criminal Charges Pending?: No Does patient have a court date: No Is patient on probation?: No  Psychosis Hallucinations: Auditory, Visual Delusions: None noted  Mental Status Report Appearance/Hygiene: Unremarkable Eye Contact: Fair Motor Activity: Restlessness Speech: Logical/coherent Level of Consciousness: Alert Mood: Anxious, Depressed Affect: Anxious, Depressed Anxiety Level:  Moderate Thought Processes: Coherent, Relevant Judgement: Impaired Orientation: Person, Place, Time, Situation Obsessive Compulsive Thoughts/Behaviors: None  Cognitive Functioning Concentration: Decreased Memory: Remote Intact, Recent Intact Is patient IDD: No Insight: Poor Impulse Control: Poor Appetite: Fair Have you had any weight changes? : Loss Amount of the weight change? (lbs): 10 lbs Sleep: Decreased Total Hours of Sleep: 5 Vegetative Symptoms: None  ADLScreening [place]([facility] Assessment Services) Patient's cognitive ability adequate to safely complete daily activities?: Yes Patient able to express need for assistance with ADLs?: Yes Independently performs ADLs?: Yes  (appropriate for developmental age)  Prior Inpatient Therapy Prior Inpatient Therapy: Yes Prior Therapy Dates: 3x in 2019 and s016 [facility] Reason for Treatment: Schizoaffective DO, MDD and substance use   Prior Outpatient Therapy Prior Outpatient Therapy: Yes Prior Therapy Dates: 2019 Prior Therapy Facilty/Provider(s): [facility] Reason for Treatment: Achizoaffective DO Does patient have an ACCT team?: No Does patient have Intensive In-House Services?  : No Does patient have [facility] services? : Yes Does patient have P4CC services?: No  ADL Screening (condition at time of admission) Patient's cognitive ability adequate to safely complete daily activities?: Yes Is the patient deaf or have difficulty hearing?: No Does the patient have difficulty seeing, even when wearing glasses/contacts?: No Does the patient have difficulty concentrating, remembering, or making decisions?: No Patient able to express need for assistance with ADLs?: Yes Does the patient have difficulty dressing or bathing?: No Independently performs ADLs?: Yes (appropriate for developmental age) Does the patient have difficulty walking or climbing stairs?: No Weakness of Legs: None Weakness of Arms/Hands: None  Home Assistive Devices/Equipment Home Assistive Devices/Equipment: None      Values / Beliefs Cultural Requests During Hospitalization: None Spiritual Requests During Hospitalization: None   Advance Directives (For Healthcare) Does Patient Have a Medical Advance Directive?: No(Patient in withdrawals)          Disposition:  Disposition Initial Assessment Completed for this Encounter: Yes Disposition of Patient: Admit Type of inpatient treatment program: Adult([facility], ned clearance first)  On Site Evaluation by:   Reviewed with Physician:    Dey-Johnson,Yula Crotwell 09/01/2018 6:39 PM

## 2018-09-01 NOTE — H&P (Signed)
Behavioral Health Medical Screening Exam  [name] is an 28 y.o. female patient present to [facility] as walk in with complaints of suicidal ideation and plan to hang herself.  Patient also states that she is using cocaine daily; When asked how much patient reports a lot. And drinks 2 "four loko" daily.  Patient also states that she is having auditory / visual hallucination.and complaints of chest pain  Total Time spent with patient: 30 minutes  Psychiatric Specialty Exam: Physical Exam  Vitals reviewed. Constitutional: She is oriented to person, place, and time. She appears well-developed and well-nourished.  Restlessness  Neck: Normal range of motion. Neck supple.  Respiratory: Effort normal.  Unable to breath through nose related to swelling; Cocaine use.   Musculoskeletal: Normal range of motion.  Neurological: She is alert and oriented to person, place, and time.  Skin: Skin is warm and dry.  Psychiatric: Her speech is normal. Her mood appears anxious. She is actively hallucinating. Cognition and memory are normal. She expresses impulsivity. She exhibits a depressed mood. She expresses suicidal ideation. She expresses suicidal plans.    Review of Systems  Cardiovascular: Positive for chest pain and palpitations.  Psychiatric/Behavioral: Positive for depression, hallucinations, substance abuse and suicidal ideas. The patient is nervous/anxious.   All other systems reviewed and are negative.   Blood pressure (!) 146/95, pulse 81, temperature 98.7 F (37.1 C), resp. rate 20, SpO2 100 %.There is no height or weight on file to calculate BMI.  General Appearance: Casual  Eye Contact:  Good  Speech:  Clear and Coherent  Volume:  Normal  Mood:  Anxious, Depressed and Hopeless  Affect:  Congruent and Depressed  Thought Process:  Coherent and Goal Directed  Orientation:  Full (Time, Place, and Person)  Thought Content:  Hallucinations: Auditory Visual  Suicidal Thoughts:  Yes.   with intent/plan  Homicidal Thoughts:  No  Memory:  Immediate;   Fair Recent;   Fair Remote;   Fair  Judgement:  Impaired  Insight:  Lacking  Psychomotor Activity:  Restlessness  Concentration: Concentration: Fair and Attention Span: Fair  Recall:  [employer] of Knowledge:Fair  Language: Good  Akathisia:  No  Handed:  Right  AIMS (if indicated):     Assets:  Communication Skills Desire for Improvement Housing Physical Health Social Support  Sleep:       Musculoskeletal: Strength & Muscle Tone: within normal limits Gait & Station: normal Patient leans: N/A  Blood pressure (!) 146/95, pulse 81, temperature 98.7 F (37.1 C), resp. rate 20, SpO2 100 %.  Recommendations:  Inpatient psychiatric treatment  Based on my evaluation the patient appears to have an emergency medical condition for which I recommend the patient be transferred to the emergency department for further evaluation.  Complaints of chest pain; will send to ED for medical clearance  Shuvon Rankin, NP 09/01/2018, 6:04 PM

## 2018-09-01 NOTE — [facility] Counselor (Signed)
Per Caryl Ada, RN no appropriate beds available. TTS to check with day-shift [facility] for bed availability.    Redmond Pulling, MS, [facility], [facility] Triage Specialist [phone]

## 2018-09-01 NOTE — ED Notes (Signed)
Attempted blood draw x 3 unsuccessful 

## 2018-09-01 NOTE — ED Triage Notes (Addendum)
Pt stated "I've been having suicidal thoughts.  I was going to hang myself.  I've had these thoughts ever since I couldn't get back to [facility] my son[facility] birthday on 2[birth year]."

## 2018-09-02 DIAGNOSIS — F250 Schizoaffective disorder, bipolar type: Secondary | ICD-10-CM

## 2018-09-02 MED ORDER — GABAPENTIN 300 MG PO CAPS
300.0000 mg | ORAL_CAPSULE | Freq: Three times a day (TID) | ORAL | Status: DC
  Administered 2018-09-02 (×2): 300 mg via ORAL
  Filled 2018-09-02 (×2): qty 1

## 2018-09-02 MED ORDER — MIRTAZAPINE 15 MG PO TBDP
15.0000 mg | ORAL_TABLET | Freq: Every day | ORAL | Status: DC
  Administered 2018-09-02: 15 mg via ORAL
  Filled 2018-09-02 (×2): qty 1

## 2018-09-02 MED ORDER — LOPERAMIDE HCL 2 MG PO CAPS: 2.0000 mg | ORAL_CAPSULE | ORAL | Status: DC | PRN

## 2018-09-02 MED ORDER — OXCARBAZEPINE 300 MG PO TABS
300.0000 mg | ORAL_TABLET | Freq: Two times a day (BID) | ORAL | Status: DC
  Administered 2018-09-02: 300 mg via ORAL
  Filled 2018-09-02 (×2): qty 1

## 2018-09-02 MED ORDER — ADULT MULTIVITAMIN W/MINERALS CH
1.0000 | ORAL_TABLET | Freq: Every day | ORAL | Status: DC
  Administered 2018-09-02: 1 via ORAL
  Filled 2018-09-02: qty 1

## 2018-09-02 MED ORDER — THIAMINE HCL 100 MG/ML IJ SOLN
100.0000 mg | Freq: Once | INTRAMUSCULAR | Status: DC
  Administered 2018-09-02: 18:00:00 via INTRAMUSCULAR
  Filled 2018-09-02: qty 2

## 2018-09-02 MED ORDER — LORAZEPAM 1 MG PO TABS: 1.0000 mg | ORAL_TABLET | Freq: Four times a day (QID) | ORAL | Status: DC | PRN

## 2018-09-02 MED ORDER — VITAMIN B-1 100 MG PO TABS: 100.0000 mg | ORAL_TABLET | Freq: Every day | ORAL | Status: DC

## 2018-09-02 MED ORDER — ONDANSETRON 4 MG PO TBDP: 4.0000 mg | ORAL_TABLET | Freq: Four times a day (QID) | ORAL | Status: DC | PRN

## 2018-09-02 MED ORDER — HYDROXYZINE HCL 25 MG PO TABS: 25.0000 mg | ORAL_TABLET | Freq: Four times a day (QID) | ORAL | Status: DC | PRN

## 2018-09-02 NOTE — Progress Notes (Signed)
Pt accepted to [facility] [id number], to Dr. Otelia Santee, MD. Bed is ready now. Call to report [phone]. VOL paperwork signed and faxed to [facility].  Princess Bruins, MSW, LCSW Therapeutic Triage Specialist  [phone]

## 2018-09-02 NOTE — Consult Note (Addendum)
[facility] Face-to-Face Psychiatry Consult   Reason for Consult:  Alcohol and cocaine abuse with suicidal ideations Referring Physician:  EDP Patient Identification: [name] MRN:  [id number] Principal Diagnosis: Schizoaffective disorder, bipolar type (HCC) Diagnosis:  Principal Problem:   Schizoaffective disorder, bipolar type (HCC) Active Problems:   Cocaine use disorder, severe, dependence (HCC)   Alcohol use disorder, moderate, dependence (HCC)   Total Time spent with patient: 45 minutes  Subjective:   [name] is a 28 y.o. female patient expresses depression and anxiety with substance abuse, requesting rehab/recovery.  Patient with a pmhx of schizoaffective disorder, depression, PTSD.   HPI:  On evaluation today, Ms. Archibald is sitting on the bed, alert and oriented x3, calm and cooperative.  She is willing to communicate with this Clinical research associate.  When asked what brought her to the hospital, patient reports a long standing history for mental illness and substance abuse.  States she uses "a lot" of cocaine daily for the past 16 years; fifth of henessey daily x 10 years.  Her responses are vague, when asked to qualify quantity she continues to state, "a lot".  She took her first drink at the age of 49, states her brother gave this to her.   She reports suicidal ideation with plan to hang herself after she missed her son's birthday.  She has three children, (2) of them live with her biological mother in [place]; and (1) who resides with her sister in [place], [place].   The patient currently lives with there boyfriend and his mother.  Reports her boyfriend drinks alcohol only. She would like inpatient admission for SUD treatment, states once she is discharged, she plans to move with her adopted mother in [place].    She reports a history for "a lot" of prior hospitalizations for suicidal attempts; and several inpatient hospitalizations for substance abuse treatment.  Most recent  substance abuse treatment was in 2018.  After appearing on the Charise Carwin show, the show sent her to 28 days inpatient rehab for heroine.  States she overdosed on heroine one week after discharge and has not used heroine since then.    She reports a history for "schizoaffective disorder" but not currently taking medications.  She is unsure when she last took medications for mental health but aware that Prozac, Abilify and Seroquel do not work for her.  She reports remeron helped stabilize her mood but could not qualify why she stopped taking this medication. States she stopped taking medications because she did not think they were helping her.  This information conflicted with  Information previously received from patient that indicated patient was compliant with taking medications prior to admission.  States she would like to get treated inpatient for SUD and prescribed medications to help her "feel better".  She is currently not connected with an outpatient provider.    She endorses good appetite, and denies GI concerns. She also reports sleeping "okay" since arrival.  She continues to endorse suicidal ideations.  Patient denies homicidal ideations and does not endorse AVH, nor does she appear to be responding to internal stimuli.     Past Psychiatric History: schizoaffective disorder, substance abuse  Risk to Self:  none Risk to Others:  none Prior Inpatient Therapy:  multiple Prior Outpatient Therapy:  none  Past Medical History:  Past Medical History:  Diagnosis Date  . ADHD (attention deficit hyperactivity disorder)   . Anxiety   . Anxiety disorder   . Constipation   . Depression   .  Mental disorder   . Personality disorder (HCC)   . Substance abuse [facility])     Past Surgical History:  Procedure Laterality Date  . CESAREAN SECTION    . FLEXIBLE SIGMOIDOSCOPY N/A 07/06/2014   Procedure: FLEXIBLE SIGMOIDOSCOPY;  Surgeon: Florencia Reasons, MD;  Location: [facility] ENDOSCOPY;  Service:  Endoscopy;  Laterality: N/A;  . HERNIA REPAIR     Family History:  Family History  Problem Relation Age of Onset  . Schizophrenia Mother   . Alcoholism Brother   . Hypertension Maternal Grandfather   . Diabetes Maternal Grandfather   . Alcohol abuse Neg Hx   . Arthritis Neg Hx   . Asthma Neg Hx   . Birth defects Neg Hx   . Cancer Neg Hx   . COPD Neg Hx   . Depression Neg Hx   . Drug abuse Neg Hx   . Early death Neg Hx   . Hearing loss Neg Hx   . Heart disease Neg Hx   . Hyperlipidemia Neg Hx   . Kidney disease Neg Hx   . Learning disabilities Neg Hx   . Mental illness Neg Hx   . Mental retardation Neg Hx   . Miscarriages / Stillbirths Neg Hx   . Stroke Neg Hx   . Vision loss Neg Hx   . Varicose Veins Neg Hx    Family Psychiatric  History: mother has schizophrenia; brother has alcohol use disorder Social History:  Social History   Substance and Sexual Activity  Alcohol Use Yes  . Alcohol/week: 2.0 standard drinks  . Types: 2 Cans of beer per week   Comment: Per pt 1 pint of liquor daily     Social History   Substance and Sexual Activity  Drug Use Yes  . Types: Cocaine, "Crack" cocaine, Marijuana    Social History   Socioeconomic History  . Marital status: Single    Spouse name: Not on file  . Number of children: 3  . Years of education: Not on file  . Highest education level: Not on file  Occupational History  . Not on file  Social Needs  . Financial resource strain: Not on file  . Food insecurity:    Worry: Not on file    Inability: Not on file  . Transportation needs:    Medical: Not on file    Non-medical: Not on file  Tobacco Use  . Smoking status: Current Every Day Smoker    Packs/day: 1.00    Types: Cigarettes  . Smokeless tobacco: Never Used  Substance and Sexual Activity  . Alcohol use: Yes    Alcohol/week: 2.0 standard drinks    Types: 2 Cans of beer per week    Comment: Per pt 1 pint of liquor daily  . Drug use: Yes    Types:  Cocaine, "Crack" cocaine, Marijuana  . Sexual activity: Yes    Birth control/protection: None  Lifestyle  . Physical activity:    Days per week: Not on file    Minutes per session: Not on file  . Stress: Not on file  Relationships  . Social connections:    Talks on phone: Not on file    Gets together: Not on file    Attends religious service: Not on file    Active member of club or organization: Not on file    Attends meetings of clubs or organizations: Not on file    Relationship status: Not on file  Other Topics Concern  .  Not on file  Social History Narrative  . Not on file   Additional Social History:    Allergies:   Allergies  Allergen Reactions  . Perphenazine Swelling    "Tongue swelling" See 12/27 H&P  . Latex Hives and Swelling    Patient states vaginal swelling occurs if a latex condom was used.  . Haldol [Haloperidol Lactate] Swelling  . Risperdal [Risperidone] Swelling    Labs:  Results for orders placed or performed during the hospital encounter of 09/01/18 (from the past 48 hour(s))  Comprehensive metabolic panel     Status: Abnormal   Collection Time: 09/01/18  8:34 PM  Result Value Ref Range   Sodium 140 135 - 145 mmol/L   Potassium 3.0 (L) 3.5 - 5.1 mmol/L   Chloride 106 98 - 111 mmol/L   CO2 24 22 - 32 mmol/L   Glucose, Bld 75 70 - 99 mg/dL   BUN 10 6 - 20 mg/dL   Creatinine, Ser 4.090.93 0.44 - 1.00 mg/dL   Calcium 8.7 (L) 8.9 - 10.3 mg/dL   Total Protein 8.0 6.5 - 8.1 g/dL   Albumin 4.3 3.5 - 5.0 g/dL   AST 22 15 - 41 U/L   ALT 17 0 - 44 U/L   Alkaline Phosphatase 115 38 - 126 U/L   Total Bilirubin 0.3 0.3 - 1.2 mg/dL   GFR calc non Af Amer >60 >60 mL/min   GFR calc Af Amer >60 >60 mL/min   Anion gap 10 5 - 15    Comment: Performed at [facility], [address] [place], [place] [address]  Ethanol     Status: None   Collection Time: 09/01/18  8:34 PM  Result Value Ref Range   Alcohol, Ethyl (B) <10 <10 mg/dL     Comment: (NOTE) Lowest detectable limit for serum alcohol is 10 mg/dL. For medical purposes only. Performed at [facility], [address] [place], [place] [address]   Salicylate level     Status: None   Collection Time: 09/01/18  8:34 PM  Result Value Ref Range   Salicylate Lvl <7.0 2.8 - 30.0 mg/dL    Comment: Performed at [facility], [address] [place], [place] [address]  Acetaminophen level     Status: Abnormal   Collection Time: 09/01/18  8:34 PM  Result Value Ref Range   Acetaminophen (Tylenol), Serum <10 (L) 10 - 30 ug/mL    Comment: (NOTE) Therapeutic concentrations vary significantly. A range of 10-30 ug/mL  may be an effective concentration for many patients. However, some  are best treated at concentrations outside of this range. Acetaminophen concentrations >150 ug/mL at 4 hours after ingestion  and >50 ug/mL at 12 hours after ingestion are often associated with  toxic reactions. Performed at [facility], [address] [place], [place] [address]   cbc     Status: Abnormal   Collection Time: 09/01/18  8:34 PM  Result Value Ref Range   WBC 8.9 4.0 - 10.5 K/uL   RBC 3.82 (L) 3.87 - 5.11 MIL/uL   Hemoglobin 11.3 (L) 12.0 - 15.0 g/dL   HCT 84.635.5 (L) 96.236.0 - 95.246.0 %   MCV 92.9 80.0 - 100.0 fL   MCH 29.6 26.0 - 34.0 pg   MCHC 31.8 30.0 - 36.0 g/dL   RDW 84.113.1 32.411.5 - 40.115.5 %   Platelets 351 150 - 400 K/uL   nRBC 0.0 0.0 - 0.2 %  Comment: Performed at [facility], [address] [place], [place] [address]  Rapid urine drug screen (hospital performed)     Status: Abnormal   Collection Time: 09/01/18  8:34 PM  Result Value Ref Range   Opiates NONE DETECTED NONE DETECTED   Cocaine POSITIVE (A) NONE DETECTED   Benzodiazepines NONE DETECTED NONE DETECTED   Amphetamines NONE DETECTED NONE DETECTED   Tetrahydrocannabinol NONE DETECTED NONE DETECTED   Barbiturates NONE DETECTED NONE  DETECTED    Comment: (NOTE) DRUG SCREEN FOR MEDICAL PURPOSES ONLY.  IF CONFIRMATION IS NEEDED FOR ANY PURPOSE, NOTIFY LAB WITHIN 5 DAYS. LOWEST DETECTABLE LIMITS FOR URINE DRUG SCREEN Drug Class                     Cutoff (ng/mL) Amphetamine and metabolites    1000 Barbiturate and metabolites    200 Benzodiazepine                 200 Tricyclics and metabolites     300 Opiates and metabolites        300 Cocaine and metabolites        300 THC                            50 Performed at [facility], [address] [place], [place] [address]   I-Stat beta hCG blood, ED     Status: None   Collection Time: 09/01/18  9:11 PM  Result Value Ref Range   I-stat hCG, quantitative <5.0 <5 mIU/mL   Comment 3            Comment:   GEST. AGE      CONC.  (mIU/mL)   <=1 WEEK        5 - 50     2 WEEKS       50 - 500     3 WEEKS       100 - 10,000     4 WEEKS     1,000 - 30,000        FEMALE AND NON-PREGNANT FEMALE:     LESS THAN 5 mIU/mL     Current Facility-Administered Medications  Medication Dose Route Frequency Provider Last Rate Last Dose  . FLUoxetine (PROZAC) capsule 20 mg  20 mg Oral Daily Ward, Chase Picket, PA-C   20 mg at 09/02/18 1053  . hydrOXYzine (ATARAX/VISTARIL) tablet 25 mg  25 mg Oral TID PRN Ward, Chase Picket, PA-C      . ibuprofen (ADVIL,MOTRIN) tablet 600 mg  600 mg Oral Daily PRN Ward, Chase Picket, PA-C      . QUEtiapine (SEROQUEL XR) 24 hr tablet 100 mg  100 mg Oral QHS Ward, Chase Picket, PA-C   100 mg at 09/01/18 2122  . traZODone (DESYREL) tablet 50 mg  50 mg Oral QHS Ward, Chase Picket, PA-C   Stopped at 09/01/18 2218   Current Outpatient Medications  Medication Sig Dispense Refill  . diphenhydrAMINE (BENADRYL) 25 MG tablet Take 150 mg by mouth once as needed (allergic reaction).    Marland Kitchen etonogestrel (NEXPLANON) 68 MG IMPL implant 1 each by Subdermal route once.    . hydrOXYzine (ATARAX/VISTARIL) 25 MG tablet Take 1 tablet (25 mg total) by  mouth 3 (three) times daily as needed for anxiety. (Patient taking differently: Take 25 mg by mouth 3 (three) times daily as needed for anxiety. Patient takes only if needed)  60 tablet 0  . ibuprofen (ADVIL,MOTRIN) 200 MG tablet Take 600 mg by mouth daily as needed for cramping (pain).    . QUEtiapine (SEROQUEL XR) 50 MG TB24 24 hr tablet Take 2 tablets (100 mg total) by mouth at bedtime. For mood control 30 each 0  . traZODone (DESYREL) 50 MG tablet Take 50 mg by mouth at bedtime.     Marland Kitchen. FLUoxetine (PROZAC) 20 MG capsule Take 1 capsule (20 mg total) by mouth daily. For depression (Patient not taking: Reported on 09/02/2018) 30 capsule 0  . nicotine polacrilex (NICORETTE) 2 MG gum Take 1 each (2 mg total) by mouth as needed for smoking cessation. (May buy from over the counter): For smoking cessation (Patient not taking: Reported on 07/30/2018) 100 tablet 0  . perphenazine (TRILAFON) 2 MG tablet Take 1 tablet (2 mg total) by mouth 2 (two) times daily. For mood control (Patient not taking: Reported on 07/30/2018) 60 tablet 0  . sodium chloride (OCEAN) 0.65 % SOLN nasal spray Place 1 spray into both nostrils as needed for congestion. (Patient not taking: Reported on 07/30/2018)  0    Musculoskeletal: Strength & Muscle Tone: within normal limits Gait & Station: normal Patient leans: N/A  Psychiatric Specialty Exam: Physical Exam  Nursing note and vitals reviewed. Constitutional: She is oriented to person, place, and time. She appears well-developed and well-nourished.  HENT:  Head: Normocephalic.  Neck: Normal range of motion.  Cardiovascular: Normal rate.  Respiratory: Effort normal.  Musculoskeletal: Normal range of motion.  Neurological: She is alert and oriented to person, place, and time.  Psychiatric: Her speech is normal and behavior is normal. Cognition and memory are normal. She expresses impulsivity. She exhibits a depressed mood. She expresses suicidal ideation.    Review of Systems   Psychiatric/Behavioral: Positive for depression and substance abuse. The patient is nervous/anxious.   All other systems reviewed and are negative.   Blood pressure (!) 103/55, pulse 64, temperature 98 F (36.7 C), temperature source Oral, resp. rate 14, height 5\' 5"  (1.651 m), weight 71.7 kg, SpO2 100 %.Body mass index is 26.29 kg/m.  General Appearance: Casual  Eye Contact:  Good  Speech:  Normal Rate  Volume:  Normal  Mood:  Depressed  Affect:  Congruent and Depressed  Thought Process:  Coherent  Orientation:  Full (Time, Place, and Person)  Thought Content:  Logical  Suicidal Thoughts: Yes  Homicidal Thoughts:  No  Memory:  Immediate;   Fair Recent;   Good Remote;   Good  Judgement: impaired  Insight: fair  Psychomotor Activity:  Decreased  Concentration:  Concentration: Fair and Attention Span: Fair  Recall:  Good  Fund of Knowledge:  Good  Language:  Fair  Akathisia:  Negative  Handed:  Right  AIMS (if indicated):     Assets:  Desire for Improvement Resilience Social Support  ADL's:  Intact  Cognition:  WNL  Sleep:        Treatment Plan Summary: Daily contact with patient to assess and evaluate symptoms and progress in treatment, Medication management and schizoaffective disorder, bipolar type:  -Started Prozac 20 mg daily -Started Trileptal 300 mg BID   Insomnia: -Started Remeron 15 mg daily at bedtime  Alcohol abuse: -Started Ativan alcohol detox protocol -Started gabapentin 300 mg TID  Recommend inpatient hospitalizations for symptom stabilization.    Disposition: Supportive therapy provided about ongoing stressors.  Nanine MeansLORD, JAMISON, NP 09/02/2018 3:21 PM  Patient seen face-to-face for psychiatric evaluation, chart reviewed and case  discussed with the physician extender and developed treatment plan. Reviewed the information documented and agree with the treatment plan. Corena Pilgrim, MD

## 2018-09-02 NOTE — ED Notes (Signed)
[facility]. Will transport there shortly.

## 2018-09-02 NOTE — ED Notes (Signed)
Pt oriented to room and unit.  Pt states she is suicidal.  Pt does however contract for safety while she is here.  Pt accepted a soda and a snack.  15 minute checks and video monitoring in place.

## 2018-09-03 ENCOUNTER — Inpatient Hospital Stay (HOSPITAL_COMMUNITY)
Admission: AD | Admit: 2018-09-03 | Discharge: 2018-09-05 | DRG: 885 | Disposition: A | Discharge status: Home / Self Care | Payer: Medicaid Other | Source: Intra-hospital | Attending: Psychiatry | Admitting: Psychiatry

## 2018-09-03 ENCOUNTER — Encounter (HOSPITAL_COMMUNITY): Payer: Self-pay

## 2018-09-03 ENCOUNTER — Other Ambulatory Visit: Payer: Self-pay

## 2018-09-03 DIAGNOSIS — F909 Attention-deficit hyperactivity disorder, unspecified type: Secondary | ICD-10-CM | POA: Diagnosis present

## 2018-09-03 DIAGNOSIS — Z91040 Latex allergy status: Secondary | ICD-10-CM | POA: Diagnosis not present

## 2018-09-03 DIAGNOSIS — Z9119 Patient's noncompliance with other medical treatment and regimen: Secondary | ICD-10-CM

## 2018-09-03 DIAGNOSIS — Z915 Personal history of self-harm: Secondary | ICD-10-CM

## 2018-09-03 DIAGNOSIS — Z9114 Patient's other noncompliance with medication regimen: Secondary | ICD-10-CM | POA: Diagnosis not present

## 2018-09-03 DIAGNOSIS — Z8249 Family history of ischemic heart disease and other diseases of the circulatory system: Secondary | ICD-10-CM | POA: Diagnosis not present

## 2018-09-03 DIAGNOSIS — F4310 Post-traumatic stress disorder, unspecified: Secondary | ICD-10-CM | POA: Diagnosis present

## 2018-09-03 DIAGNOSIS — Z833 Family history of diabetes mellitus: Secondary | ICD-10-CM | POA: Diagnosis not present

## 2018-09-03 DIAGNOSIS — Z888 Allergy status to other drugs, medicaments and biological substances status: Secondary | ICD-10-CM

## 2018-09-03 DIAGNOSIS — Z79899 Other long term (current) drug therapy: Secondary | ICD-10-CM

## 2018-09-03 DIAGNOSIS — Z791 Long term (current) use of non-steroidal anti-inflammatories (NSAID): Secondary | ICD-10-CM

## 2018-09-03 DIAGNOSIS — Z818 Family history of other mental and behavioral disorders: Secondary | ICD-10-CM

## 2018-09-03 DIAGNOSIS — R45851 Suicidal ideations: Secondary | ICD-10-CM | POA: Diagnosis present

## 2018-09-03 DIAGNOSIS — F250 Schizoaffective disorder, bipolar type: Secondary | ICD-10-CM | POA: Diagnosis present

## 2018-09-03 DIAGNOSIS — G4700 Insomnia, unspecified: Secondary | ICD-10-CM | POA: Diagnosis present

## 2018-09-03 DIAGNOSIS — Z62810 Personal history of physical and sexual abuse in childhood: Secondary | ICD-10-CM | POA: Diagnosis present

## 2018-09-03 DIAGNOSIS — F1420 Cocaine dependence, uncomplicated: Secondary | ICD-10-CM | POA: Diagnosis present

## 2018-09-03 DIAGNOSIS — Z811 Family history of alcohol abuse and dependence: Secondary | ICD-10-CM | POA: Diagnosis not present

## 2018-09-03 MED ORDER — OXCARBAZEPINE 300 MG PO TABS
300.0000 mg | ORAL_TABLET | Freq: Two times a day (BID) | ORAL | Status: DC
  Administered 2018-09-03: 300 mg via ORAL
  Filled 2018-09-03 (×3): qty 1

## 2018-09-03 MED ORDER — ONDANSETRON 4 MG PO TBDP: 4.0000 mg | ORAL_TABLET | Freq: Four times a day (QID) | ORAL | Status: DC | PRN

## 2018-09-03 MED ORDER — QUETIAPINE FUMARATE ER 200 MG PO TB24
200.0000 mg | ORAL_TABLET | Freq: Every day | ORAL | Status: DC
  Administered 2018-09-03 – 2018-09-04 (×2): 200 mg via ORAL
  Filled 2018-09-03 (×3): qty 1

## 2018-09-03 MED ORDER — MAGNESIUM HYDROXIDE 400 MG/5ML PO SUSP: 30.0000 mL | Freq: Every day | ORAL | Status: DC | PRN

## 2018-09-03 MED ORDER — VITAMIN B-1 100 MG PO TABS
100.0000 mg | ORAL_TABLET | Freq: Every day | ORAL | Status: DC
  Administered 2018-09-03 – 2018-09-05 (×3): 100 mg via ORAL
  Filled 2018-09-03 (×6): qty 1

## 2018-09-03 MED ORDER — THIAMINE HCL 100 MG/ML IJ SOLN: 100.0000 mg | Freq: Once | INTRAMUSCULAR | Status: DC

## 2018-09-03 MED ORDER — ADULT MULTIVITAMIN W/MINERALS CH
1.0000 | ORAL_TABLET | Freq: Every day | ORAL | Status: DC
  Administered 2018-09-03 – 2018-09-05 (×3): 1 via ORAL
  Filled 2018-09-03 (×5): qty 1

## 2018-09-03 MED ORDER — IBUPROFEN 600 MG PO TABS
600.0000 mg | ORAL_TABLET | Freq: Every day | ORAL | Status: DC | PRN
  Administered 2018-09-03: 600 mg via ORAL
  Filled 2018-09-03 (×2): qty 1

## 2018-09-03 MED ORDER — ACETAMINOPHEN 325 MG PO TABS
650.0000 mg | ORAL_TABLET | Freq: Four times a day (QID) | ORAL | Status: DC | PRN
  Administered 2018-09-03: 650 mg via ORAL
  Filled 2018-09-03: qty 2

## 2018-09-03 MED ORDER — NICOTINE POLACRILEX 2 MG MT GUM: 2.0000 mg | CHEWING_GUM | OROMUCOSAL | Status: DC | PRN

## 2018-09-03 MED ORDER — ALUM & MAG HYDROXIDE-SIMETH 200-200-20 MG/5ML PO SUSP: 30.0000 mL | ORAL | Status: DC | PRN

## 2018-09-03 MED ORDER — MIRTAZAPINE 15 MG PO TBDP
15.0000 mg | ORAL_TABLET | Freq: Every day | ORAL | Status: DC
  Administered 2018-09-03 – 2018-09-04 (×2): 15 mg via ORAL
  Filled 2018-09-03 (×3): qty 1

## 2018-09-03 MED ORDER — LOPERAMIDE HCL 2 MG PO CAPS: 2.0000 mg | ORAL_CAPSULE | ORAL | Status: DC | PRN

## 2018-09-03 MED ORDER — GABAPENTIN 300 MG PO CAPS
300.0000 mg | ORAL_CAPSULE | Freq: Three times a day (TID) | ORAL | Status: DC
  Administered 2018-09-03 – 2018-09-05 (×6): 300 mg via ORAL
  Filled 2018-09-03 (×10): qty 1

## 2018-09-03 MED ORDER — HYDROXYZINE HCL 25 MG PO TABS
25.0000 mg | ORAL_TABLET | Freq: Three times a day (TID) | ORAL | Status: DC | PRN
  Administered 2018-09-03 – 2018-09-04 (×4): 25 mg via ORAL
  Filled 2018-09-03 (×4): qty 1

## 2018-09-03 MED ORDER — FLUOXETINE HCL 20 MG PO CAPS
20.0000 mg | ORAL_CAPSULE | Freq: Every day | ORAL | Status: DC
  Administered 2018-09-03 – 2018-09-05 (×3): 20 mg via ORAL
  Filled 2018-09-03 (×5): qty 1

## 2018-09-03 MED ORDER — LORAZEPAM 1 MG PO TABS
1.0000 mg | ORAL_TABLET | Freq: Four times a day (QID) | ORAL | Status: DC | PRN
  Administered 2018-09-03: 1 mg via ORAL
  Filled 2018-09-03: qty 1

## 2018-09-03 NOTE — Progress Notes (Signed)
Recreation Therapy Notes  Date: 2.10.20 Time:  1000 Location: 500 Hall Dayroom    Group Topic: Coping Skills  Goal Area(s) Addresses:  Patient will identify positive coping skills. Patient will identify benefits of using coping skills.  Behavioral Response: Engaged  Intervention: Worksheet, pencils, white board, marker, eraser  Activity: Mind map.  LRT and patients filled in the first eight boxes with anxiety, anger, depression, finances, disrespect, death, loss of employment and relationships.  Patients were then given time to come up with at least 3 coping skills for each trigger identified.  Group would reconvene and LRT wrote the coping skills on the board.  Education: Pharmacologist, Building control surveyor.   Education Outcome: Acknowledges understanding/In group clarification offered/Needs additional education.   Clinical Observations/Feedback: Pt identified her coping skills as anxiety- squeeze stress ball, ball up paper; anger- tear up paper; depression- yoga with soft music; finances- get a job, Physiological scientist; disrespect- walk away; death- go to grief meetings; loss of employment- find another job; relationships- listen to love music.      Caroll Rancher, LRT/CTRS     Caroll Rancher A 09/03/2018 11:23 AM

## 2018-09-03 NOTE — Plan of Care (Signed)
Problem: Education: Goal: Knowledge of  General Education information/materials will improve Outcome: Progressing   Problem: Safety: Goal: Periods of time without injury will increase Outcome: Progressing   Problem: Medication: Goal: Compliance with prescribed medication regimen will improve Outcome: Progressing DAR Note: Pt visible in dayroom watching TV with peers at this time. A & O X4. Denies SI, HI, AVH and pain when assessed. Reports she's sleeping well with good appetite. Presents anxious and irritable on initial interactions. Per pt "I'm ok right now, I just need to get into rehab for 30 or 60 days because I keep coming back all the time". Attended 50% of groups today with prompt. Took her medications without issues.  Emotional support and reassurance provided to pt throughout this shift. Encouraged pt to voice concerns, attends groups as scheduled. All medications given per provider's orders with verbal education and effects monitored. Q 15 minutes safety checks continues without incident.   Pt receptive to care. Tolerates all PO intake well. POC continues for safety and mood stability; pt monitored as such.

## 2018-09-03 NOTE — [facility] Group Notes (Signed)
[facility] LCSW Group Therapy Note  Date/Time: 09/03/18, 1315  Type of Therapy and Topic:  Group Therapy:  Overcoming Obstacles  Participation Level:  Did not attend  Description of Group:    In this group patients will be encouraged to explore what they see as obstacles to their own wellness and recovery. They will be guided to discuss their thoughts, feelings, and behaviors related to these obstacles. The group will process together ways to cope with barriers, with attention given to specific choices patients can make. Each patient will be challenged to identify changes they are motivated to make in order to overcome their obstacles. This group will be process-oriented, with patients participating in exploration of their own experiences as well as giving and receiving support and challenge from other group members.  Therapeutic Goals: 1. Patient will identify personal and current obstacles as they relate to admission. 2. Patient will identify barriers that currently interfere with their wellness or overcoming obstacles.  3. Patient will identify feelings, thought process and behaviors related to these barriers. 4. Patient will identify two changes they are willing to make to overcome these obstacles:    Summary of Patient Progress      Therapeutic Modalities:   Cognitive Behavioral Therapy Solution Focused Therapy Motivational Interviewing Relapse Prevention Therapy  Greg Miley Blanchett, LCSW 

## 2018-09-03 NOTE — [facility] Suicide Risk Assessment (Signed)
[facility] INPATIENT:  Family/Significant Other Suicide Prevention Education  Suicide Prevention Education:  Patient Refusal for Family/Significant Other Suicide Prevention Education: The patient [name] has refused to provide written consent for family/significant other to be provided Family/Significant Other Suicide Prevention Education during admission and/or prior to discharge.  Physician notified.  Lorri FrederickWierda, Macaulay Reicher Jon, LCSW 09/03/2018, 3:42 PM

## 2018-09-03 NOTE — Tx Team (Addendum)
Interdisciplinary Treatment and Diagnostic Plan Update  09/05/2018 Time of Session: [place] MRN: [id number]  Principal Diagnosis: Schizoaffective disorder, bipolar type [facility])  Secondary Diagnoses: Principal Problem:   Schizoaffective disorder, bipolar type ([place])   Current Medications:  No current facility-administered medications for this encounter.    Current Outpatient Medications  Medication Sig Dispense Refill  . FLUoxetine (PROZAC) 20 MG capsule Take 1 capsule (20 mg total) by mouth daily. For depression 30 capsule 0  . gabapentin (NEURONTIN) 300 MG capsule Take 1 capsule (300 mg total) by mouth 3 (three) times daily. For agitation 90 capsule 0  . hydrOXYzine (ATARAX/VISTARIL) 25 MG tablet Take 1 tablet (25 mg total) by mouth 3 (three) times daily as needed for anxiety. 60 tablet 0  . mirtazapine (REMERON SOL-TAB) 15 MG disintegrating tablet Take 1 tablet (15 mg total) by mouth at bedtime. For depression/sleep 30 tablet 0  . nicotine polacrilex (NICORETTE) 2 MG gum Take 1 each (2 mg total) by mouth as needed for smoking cessation. (May buy from over the counter): For smoking cessation 100 tablet 0  . QUEtiapine (SEROQUEL XR) 200 MG 24 hr tablet Take 1 tablet (200 mg total) by mouth at bedtime. For mood control 30 tablet 0   PTA Medications: No medications prior to admission.    Patient Stressors: Financial difficulties Medication change or noncompliance Substance abuse  Patient Strengths: Technical sales engineer for treatment/growth  Treatment Modalities: Medication Management, Group therapy, Case management,  1 to 1 session with clinician, Psychoeducation, Recreational therapy.   Physician Treatment Plan for Primary Diagnosis: Schizoaffective disorder, bipolar type ([place]) Long Term Goal(s): Improvement in symptoms so as ready for discharge Improvement in symptoms so as ready for discharge   Short Term Goals: Ability to verbalize feelings will  improve Ability to disclose and discuss suicidal ideas Ability to demonstrate self-control will improve Ability to identify and develop effective coping behaviors will improve Compliance with prescribed medications will improve Ability to identify triggers associated with substance abuse/mental health issues will improve  Medication Management: Evaluate patient's response, side effects, and tolerance of medication regimen.  Therapeutic Interventions: 1 to 1 sessions, Unit Group sessions and Medication administration.  Evaluation of Outcomes: Not Met  Physician Treatment Plan for Secondary Diagnosis: Principal Problem:   Schizoaffective disorder, bipolar type ([place])  Long Term Goal(s): Improvement in symptoms so as ready for discharge Improvement in symptoms so as ready for discharge   Short Term Goals: Ability to verbalize feelings will improve Ability to disclose and discuss suicidal ideas Ability to demonstrate self-control will improve Ability to identify and develop effective coping behaviors will improve Compliance with prescribed medications will improve Ability to identify triggers associated with substance abuse/mental health issues will improve     Medication Management: Evaluate patient's response, side effects, and tolerance of medication regimen.  Therapeutic Interventions: 1 to 1 sessions, Unit Group sessions and Medication administration.  Evaluation of Outcomes: Not Met   RN Treatment Plan for Primary Diagnosis: Schizoaffective disorder, bipolar type ([place]) Long Term Goal(s): Knowledge of disease and therapeutic regimen to maintain health will improve  Short Term Goals: Ability to identify and develop effective coping behaviors will improve and Compliance with prescribed medications will improve  Medication Management: RN will administer medications as ordered by provider, will assess and evaluate patient's response and provide education to patient for prescribed  medication. RN will report any adverse and/or side effects to prescribing provider.  Therapeutic Interventions: 1 on 1 counseling sessions, Psychoeducation, Medication administration,  Evaluate responses to treatment, Monitor vital signs and CBGs as ordered, Perform/monitor CIWA, COWS, AIMS and Fall Risk screenings as ordered, Perform wound care treatments as ordered.  Evaluation of Outcomes: Not Met   LCSW Treatment Plan for Primary Diagnosis: Schizoaffective disorder, bipolar type ([place]) Long Term Goal(s): Safe transition to appropriate next level of care at discharge, Engage patient in therapeutic group addressing interpersonal concerns.  Short Term Goals: Engage patient in aftercare planning with referrals and resources, Increase social support and Increase skills for wellness and recovery  Therapeutic Interventions: Assess for all discharge needs, 1 to 1 time with Social worker, Explore available resources and support systems, Assess for adequacy in community support network, Educate family and significant other(s) on suicide prevention, Complete Psychosocial Assessment, Interpersonal group therapy.  Evaluation of Outcomes: Not Met   Progress in Treatment: Attending groups: No. Participating in groups: No. Taking medication as prescribed: Yes. Toleration medication: Yes. Family/Significant other contact made: No, will contact:  when given permission Patient understands diagnosis: Yes. Discussing patient identified problems/goals with staff: Yes. Medical problems stabilized or resolved: Yes. Denies suicidal/homicidal ideation: Yes. Issues/concerns per patient self-inventory: No. Other: none  New problem(s) identified: No, Describe:  none  New Short Term/Long Term Goal(s):  Patient Goals:  "get into rehab"  Discharge Plan or Barriers:   Reason for Continuation of Hospitalization: Hallucinations Medication stabilization  Estimated Length of Stay: 2-4  days.  Attendees: Patient:[name] 09/03/2018   Physician: Dr. Jake Samples, MD 09/03/2018   Nursing: Keane Police, RN 09/03/2018   RN Care Manager: 09/03/2018   Social Worker: Lurline Idol, LCSW 09/03/2018   Recreational Therapist:  09/03/2018   Other:  09/03/2018   Other:  09/03/2018   Other: 09/03/2018        Scribe for Treatment Team: Joanne Chars, [place] 09/05/2018 2:43 PM

## 2018-09-03 NOTE — Progress Notes (Signed)
Pt is a 28 year old female admitted with schizoaffective disorder and cocaine use disorder    She also has depression and was suicidal with a plan to hang herself   Pt has not been taking her mental health medications    Pt is restless and anxious and wil sit in the chair and shake all over but sometimes she sits very still   Pt is currently homeless and wants to go to rehab after treatment at [facility]    She also abuses ETOH    She was oriented to the unit and given nourishment    Verbal support given   Medications administered and monitor for effectiveness    Q 15 min checks initiated    Pt remains safe and is adjusting well

## 2018-09-03 NOTE — Tx Team (Signed)
Initial Treatment Plan 09/03/2018 2:23 AM [name] MIW:[id number]    PATIENT STRESSORS: Financial difficulties Medication change or noncompliance Substance abuse   PATIENT STRENGTHS: General fund of knowledge Motivation for treatment/growth   PATIENT IDENTIFIED PROBLEMS: "I want to go to Rehab"  "get back on my medication"                   DISCHARGE CRITERIA:  Adequate post-discharge living arrangements Improved stabilization in mood, thinking, and/or behavior Verbal commitment to aftercare and medication compliance  PRELIMINARY DISCHARGE PLAN: Attend aftercare/continuing care group Attend 12-step recovery group Placement in alternative living arrangements  PATIENT/FAMILY INVOLVEMENT: This treatment plan has been presented to and reviewed with the patient, [name], and/or family member, .  The patient and family have been given the opportunity to ask questions and make suggestions.  Andrena Mews, RN 09/03/2018, 2:23 AM

## 2018-09-03 NOTE — [facility] Suicide Risk Assessment (Signed)
[facility] Admission Suicide Risk Assessment   Nursing information obtained from:  Patient Demographic factors:  Low socioeconomic status, Unemployed, Adolescent or young adult Current Mental Status:  Suicidal ideation indicated by patient, Plan includes specific time, place, or method Loss Factors:  Financial problems / change in socioeconomic status Historical Factors:  Impulsivity Risk Reduction Factors:  Positive therapeutic relationship  Total Time spent with patient: 45 minutes Principal Problem: <principal problem not specified> Diagnosis:  Active Problems:   Schizoaffective disorder, bipolar type (H[facility])  Subjective Data: Possible issues of secondary gain due to homelessness patient vague denies current thoughts of harming herself states "I did have suicidal thoughts but learned I can get into rehab if I say that" states she does not have genuine suicidal thoughts at the present time can contract fully no hallucinations  Continued Clinical Symptoms:  Alcohol Use Disorder Identification Test Final Score (AUDIT): 26 The "Alcohol Use Disorders Identification Test", Guidelines for Use in Primary Care, Second Edition.  World Science writer [facility]). Score between 0-7:  no or low risk or alcohol related problems. Score between 8-15:  moderate risk of alcohol related problems. Score between 16-19:  high risk of alcohol related problems. Score 20 or above:  warrants further diagnostic evaluation for alcohol dependence and treatment.   CLINICAL FACTORS:   Dysthymia    COGNITIVE FEATURES THAT CONTRIBUTE TO RISK:  Loss of executive function    SUICIDE RISK:   Minimal: No identifiable suicidal ideation.  Patients presenting with no risk factors but with morbid ruminations; may be classified as minimal risk based on the severity of the depressive symptoms  PLAN OF CARE: Admit monitor for withdrawal probable discharge to rehab in 2 to 3 days  I certify that inpatient services furnished can  reasonably be expected to improve the patient's condition.   Malvin Johns, MD 09/03/2018, 10:10 AM
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
## 2018-09-03 NOTE — Progress Notes (Signed)
Recreation Therapy Notes  INPATIENT RECREATION THERAPY ASSESSMENT  Patient Details Name: [name] MRN: [id number] DOB: 06-06-1991 Today's Date: 09/03/2018       Information Obtained From: Patient  Able to Participate in Assessment/Interview: Yes  Patient Presentation: Alert  Reason for Admission (Per Patient): Other (Comments)(Pt stated she wanted to get into rehab.)  Patient Stressors: Relationship, Other (Comment)(Not having custody of her kids; fighting with boyfriend)  Coping Skills:   Isolation, Counselling psychologist, Sports, Arguments, Aggression, Music, Meditate, Deep Breathing, Substance Abuse, Impulsivity, Prayer, Art, Avoidance, Read, Dance, Hot Bath/Shower  Leisure Interests (2+):  Music - Listen, Sports - Dance  Frequency of Recreation/Participation: Other (Comment)(Daily)  Awareness of Community Resources:  No  Expressed Interest in [employer] Information: No  County of Residence:  [place]  Patient Main Form of Transportation: Walk  Patient Strengths:  Nice person; Nice smile  Patient Identified Areas of Improvement:  Stop drinking and drugging all day  Patient Goal for Hospitalization:  "Get on better medication and go to rehab"  Current SI (including self-harm):  No  Current HI:  No  Current AVH: Yes(Pt stated she always hears and sees things.)  Staff Intervention Plan: Group Attendance  Consent to Intern Participation: N/A    Caroll Rancher, LRT/CTRS  Lillia Abed, Dnya Hickle A 09/03/2018, 1:58 PM

## 2018-09-03 NOTE — Plan of Care (Signed)
D: Pt denies SI/HI , +ve AVH- contract for safety. Pt is pleasant and cooperative. Pt stated the Remeron helps with her AVH . Pt stated she wanted to to go to a Rehab when she D/C. Pt stated she has not been ready in the past but is ready for now. Pt educated on finishing whatever program she decides to go to, with pt understanding.  A: Pt was offered support and encouragement. Pt was given scheduled medications. Pt was encourage to attend groups. Q 15 minute checks were done for safety.  R:Pt attends groups and interacts well with peers and staff. Pt is taking medication. Pt has no complaints.Pt receptive to treatment and safety maintained on unit.  Problem: Education: Goal: Emotional status will improve Outcome: Progressing   Problem: Education: Goal: Mental status will improve Outcome: Progressing
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
## 2018-09-04 NOTE — Progress Notes (Signed)
DAR Note: Pt asleep in bed at this time. Presents with sullen affect, irritable mood and elective mutism on initial approach. Remains preoccupied about getting into a rehab facility during interactions. Pt refused all scheduled medications this morning on multiple attempts. Pt later took her medications at noon. Guarded and minimal on interactions. Pt did not attend groups as scheduled despite several prompts "no". Out of bed for meals, tolerated all PO intake well.  All medications administered per provider's order with verbal education and effects monitored. Encouraged pt to voice concerns, attend to ADLs and comply with current treatment regimen. Q 15 minutes safety checks maintained without self harm gestures or outburst to note thus far. Pt receptive to care. POC continues for safety and mood stability; pt monitored as such. Pt denies concerns at this time.

## 2018-09-04 NOTE — Plan of Care (Signed)
D: Pt denies SI/HI , AVH- better, they always there. Pt is pleasant and cooperative. Pt stated the AVH was getting better, pt concerned that she was sleep much of the day and missed talking to the Doctor and Child psychotherapist. Pt stated she really wanted to get into a Rehab program.   A: Pt was offered support and encouragement. Pt was given scheduled medications. Pt was encourage to attend groups. Q 15 minute checks were done for safety.   R:Pt attends groups and interacts well with peers and staff. Pt is taking medication. Pt has no complaints.Pt receptive to treatment and safety maintained on unit.  Problem: Education: Goal: Mental status will improve Outcome: Progressing   Problem: Activity: Goal: Sleeping patterns will improve Outcome: Progressing

## 2018-09-04 NOTE — Progress Notes (Signed)
Black colored overnight bag containing clothing and toiletries were placed behind the curtain in the search room.**

## 2018-09-04 NOTE — Progress Notes (Signed)
Recreation Therapy Notes  Date: 2.11.20 Time: 1000 Location: 500 Hall Dayroom  Group Topic:  Goal Setting  Goal Area(s) Addresses:  Patient will be able to identify at least 3 life goals.  Patient will be able to identify benefit of investing in life goals.  Patient will be able to identify benefit of setting life goals.   Intervention: Worksheet, pencils  Activity: Goal Planning.  Patients were to identify goals they want to accomplish in a week, a month, in 1 yr and in 5 yrs.  Patients were to then identify obstacles to achieving their goals, what they need to accomplish their goals and what they can start doing now to reach their goals.  Education:  Discharge Planning, Coping Skills, Life Goals   Education Outcome: Acknowledges Education/In Group Clarification Provided/Needs Additional Education  Clinical Observations: Pt did not attend group.      Caroll Rancher, LRT/CTRS         Caroll Rancher A 09/04/2018 11:41 AM

## 2018-09-04 NOTE — Progress Notes (Signed)
[facility] MD Progress Note  09/04/2018 10:57 AM [name]  MRN:  [id number] Subjective:   Seen generally improved in mood and affect but guarded and passive, denies wanting to harm self Issues of secondary gain/homelessness, Psychosis no withdrawal  Principal Problem: Schizoaffective disorder, bipolar type (HCC) Diagnosis: Principal Problem:   Schizoaffective disorder, bipolar type (HCC)  Total Time spent with patient: 20 minutes  Past Medical History:  Past Medical History:  Diagnosis Date  . ADHD (attention deficit hyperactivity disorder)   . Anxiety   . Anxiety disorder   . Constipation   . Depression   . Mental disorder   . Personality disorder (HCC)   . Substance abuse [facility])     Past Surgical History:  Procedure Laterality Date  . CESAREAN SECTION    . FLEXIBLE SIGMOIDOSCOPY N/A 07/06/2014   Procedure: FLEXIBLE SIGMOIDOSCOPY;  Surgeon: Florencia Reasons, MD;  Location: [facility] ENDOSCOPY;  Service: Endoscopy;  Laterality: N/A;  . HERNIA REPAIR     Family History:  Family History  Problem Relation Age of Onset  . Schizophrenia Mother   . Alcoholism Brother   . Hypertension Maternal Grandfather   . Diabetes Maternal Grandfather   . Alcohol abuse Neg Hx   . Arthritis Neg Hx   . Asthma Neg Hx   . Birth defects Neg Hx   . Cancer Neg Hx   . COPD Neg Hx   . Depression Neg Hx   . Drug abuse Neg Hx   . Early death Neg Hx   . Hearing loss Neg Hx   . Heart disease Neg Hx   . Hyperlipidemia Neg Hx   . Kidney disease Neg Hx   . Learning disabilities Neg Hx   . Mental illness Neg Hx   . Mental retardation Neg Hx   . Miscarriages / Stillbirths Neg Hx   . Stroke Neg Hx   . Vision loss Neg Hx   . Varicose Veins Neg Hx     Social History:  Social History   Substance and Sexual Activity  Alcohol Use Yes  . Alcohol/week: 2.0 standard drinks  . Types: 2 Cans of beer per week   Comment: Per pt 1 pint of liquor daily     Social History   Substance and Sexual Activity   Drug Use Yes  . Types: Cocaine, "Crack" cocaine, Marijuana    Social History   Socioeconomic History  . Marital status: Single    Spouse name: Not on file  . Number of children: 3  . Years of education: Not on file  . Highest education level: Not on file  Occupational History  . Not on file  Social Needs  . Financial resource strain: Not on file  . Food insecurity:    Worry: Not on file    Inability: Not on file  . Transportation needs:    Medical: Not on file    Non-medical: Not on file  Tobacco Use  . Smoking status: Current Every Day Smoker    Packs/day: 1.00    Types: Cigarettes  . Smokeless tobacco: Never Used  Substance and Sexual Activity  . Alcohol use: Yes    Alcohol/week: 2.0 standard drinks    Types: 2 Cans of beer per week    Comment: Per pt 1 pint of liquor daily  . Drug use: Yes    Types: Cocaine, "Crack" cocaine, Marijuana  . Sexual activity: Yes    Birth control/protection: None  Lifestyle  . Physical activity:  Days per week: Not on file    Minutes per session: Not on file  . Stress: Not on file  Relationships  . Social connections:    Talks on phone: Not on file    Gets together: Not on file    Attends religious service: Not on file    Active member of club or organization: Not on file    Attends meetings of clubs or organizations: Not on file    Relationship status: Not on file  Other Topics Concern  . Not on file  Social History Narrative  . Not on file   Additional Social History:    Pain Medications: See home med list Prescriptions: See home med list Over the Counter: See home med list History of alcohol / drug use?: Yes Longest period of sobriety (when/how long): unknown Negative Consequences of Use: Financial, Personal relationships Withdrawal Symptoms: Agitation, Irritability Name of Substance 1: Powdered Cocaine 1 - Age of First Use: Since age 28 years 1 - Amount (size/oz): $50 worth per occassion 1 - Frequency: daily if  have money 1 - Last Use / Amount: This morning/$50 worth Name of Substance 2: Four Loko 2 - Age of First Use: 9 years 2 - Amount (size/oz): varies 2 - Frequency: 2, (24 oz) 2 - Duration: ongoing 2 - Last Use / Amount: 08/31/2018/1 24oz can                Sleep: Good  Appetite:  Good  Current Medications: Current Facility-Administered Medications  Medication Dose Route Frequency Provider Last Rate Last Dose  . acetaminophen (TYLENOL) tablet 650 mg  650 mg Oral Q6H PRN Charm RingsLord, Jamison Y, NP   650 mg at 09/03/18 1714  . alum & mag hydroxide-simeth (MAALOX/MYLANTA) 200-200-20 MG/5ML suspension 30 mL  30 mL Oral Q4H PRN Charm RingsLord, Jamison Y, NP      . FLUoxetine (PROZAC) capsule 20 mg  20 mg Oral Daily Charm RingsLord, Jamison Y, NP   20 mg at 09/03/18 0932  . gabapentin (NEURONTIN) capsule 300 mg  300 mg Oral TID Charm RingsLord, Jamison Y, NP   300 mg at 09/03/18 1714  . hydrOXYzine (ATARAX/VISTARIL) tablet 25 mg  25 mg Oral TID PRN Charm RingsLord, Jamison Y, NP   25 mg at 09/03/18 2053  . ibuprofen (ADVIL,MOTRIN) tablet 600 mg  600 mg Oral Daily PRN Charm RingsLord, Jamison Y, NP   600 mg at 09/03/18 0150  . loperamide (IMODIUM) capsule 2-4 mg  2-4 mg Oral PRN Charm RingsLord, Jamison Y, NP      . LORazepam (ATIVAN) tablet 1 mg  1 mg Oral Q6H PRN Charm RingsLord, Jamison Y, NP   1 mg at 09/03/18 0150  . magnesium hydroxide (MILK OF MAGNESIA) suspension 30 mL  30 mL Oral Daily PRN Charm RingsLord, Jamison Y, NP      . mirtazapine (REMERON SOL-TAB) disintegrating tablet 15 mg  15 mg Oral QHS Charm RingsLord, Jamison Y, NP   15 mg at 09/03/18 2053  . multivitamin with minerals tablet 1 tablet  1 tablet Oral Daily Charm RingsLord, Jamison Y, NP   1 tablet at 09/03/18 0931  . nicotine polacrilex (NICORETTE) gum 2 mg  2 mg Oral PRN Malvin JohnsFarah, Caridad Silveira, MD      . ondansetron (ZOFRAN-ODT) disintegrating tablet 4 mg  4 mg Oral Q6H PRN Charm RingsLord, Jamison Y, NP      . QUEtiapine (SEROQUEL XR) 24 hr tablet 200 mg  200 mg Oral QHS Malvin JohnsFarah, Edouard Gikas, MD   200 mg at 09/03/18 2053  .  thiamine (B-1) injection 100 mg   100 mg Intramuscular Once Charm Rings, NP      . thiamine (VITAMIN B-1) tablet 100 mg  100 mg Oral Daily Charm Rings, NP   100 mg at 09/03/18 3976    Lab Results: No results found for this or any previous visit (from the past 48 hour(s)).  Blood Alcohol level:  Lab Results  Component Value Date   ETH <10 09/01/2018   ETH <10 06/25/2018    Metabolic Disorder Labs: Lab Results  Component Value Date   HGBA1C 5.0 06/26/2018   MPG 96.8 06/26/2018   MPG 100 08/07/2014   Lab Results  Component Value Date   PROLACTIN 33.2 (H) 06/16/2015   Lab Results  Component Value Date   CHOL 168 06/26/2018   TRIG 36 06/26/2018   HDL 83 06/26/2018   CHOLHDL 2.0 06/26/2018   VLDL 7 06/26/2018   LDLCALC 78 06/26/2018   LDLCALC 50 08/07/2014    Physical Findings: AIMS: Facial and Oral Movements Muscles of Facial Expression: None, normal Lips and Perioral Area: None, normal Jaw: None, normal Tongue: None, normal,Extremity Movements Upper (arms, wrists, hands, fingers): None, normal Lower (legs, knees, ankles, toes): None, normal, Trunk Movements Neck, shoulders, hips: None, normal, Overall Severity Severity of abnormal movements (highest score from questions above): None, normal Incapacitation due to abnormal movements: None, normal Patient's awareness of abnormal movements (rate only patient's report): No Awareness, Dental Status Current problems with teeth and/or dentures?: No Does patient usually wear dentures?: No  CIWA:  CIWA-Ar Total: 0 COWS:     Musculoskeletal: Strength & Muscle Tone: within normal limits Gait & Station: normal Patient leans: N/A  Psychiatric Specialty Exam: Physical Exam  ROS  Blood pressure 125/75, pulse (!) 105, temperature 98 F (36.7 C), temperature source Oral, resp. rate 18, height 5\' 5"  (1.651 m), weight 77.6 kg.Body mass index is 28.46 kg/m.  General Appearance: Casual  Eye Contact:  Minimal  Speech:  Slow  Volume:  Decreased  Mood:   Dysphoric  Affect:  Appropriate  Thought Process:  Coherent  Orientation:  Full (Time, Place, and Person)  Thought Content:  Tangential  Suicidal Thoughts:  No  Homicidal Thoughts:  No  Memory:  Immediate;   Good  Judgement:  Good  Insight:  Good  Psychomotor Activity:  Normal  Concentration:  Concentration: Good  Recall:  Good  Fund of Knowledge:  Good  Language:  Fair  Akathisia:  Negative  Handed:  Right  AIMS (if indicated):     Assets:  Physical Health Resilience  ADL's:  Intact  Cognition:  WNL  Sleep:  Number of Hours: 4.75     Treatment Plan Summary: Daily contact with patient to assess and evaluate symptoms and progress in treatment, Medication management and Plan Continue current cognitive and rehab based therapies probable discharge tomorrow issues of secondary gain discussed due to homelessness, again no thoughts of harming self or others  Malvin Johns, MD 09/04/2018, 10:57 AM

## 2018-09-05 MED ORDER — QUETIAPINE FUMARATE ER 200 MG PO TB24: 200.0000 mg | ORAL_TABLET | Freq: Every day | ORAL | 0 refills | Status: DC

## 2018-09-05 MED ORDER — FLUOXETINE HCL 20 MG PO CAPS: 20.0000 mg | ORAL_CAPSULE | Freq: Every day | ORAL | 0 refills | Status: DC

## 2018-09-05 MED ORDER — GABAPENTIN 300 MG PO CAPS: 300.0000 mg | ORAL_CAPSULE | Freq: Three times a day (TID) | ORAL | 0 refills | Status: DC

## 2018-09-05 MED ORDER — HYDROXYZINE HCL 25 MG PO TABS: 25.0000 mg | ORAL_TABLET | Freq: Three times a day (TID) | ORAL | 0 refills | Status: DC | PRN

## 2018-09-05 MED ORDER — MIRTAZAPINE 15 MG PO TBDP: 15.0000 mg | ORAL_TABLET | Freq: Every day | ORAL | 0 refills | Status: DC

## 2018-09-05 MED ORDER — NICOTINE POLACRILEX 2 MG MT GUM: 2.0000 mg | CHEWING_GUM | OROMUCOSAL | 0 refills | Status: DC | PRN

## 2018-09-05 NOTE — Progress Notes (Signed)
  [facility] Adult Case Management Discharge Plan :  Will you be returning to the same living situation after discharge:  No.Pt going to [facility] At discharge, do you have transportation home?: No. Bus pass provided.  Do you have the ability to pay for your medications: Yes,  medicaid  Release of information consent forms completed and in the chart;  Patient's signature needed at discharge.  Patient to Follow up at: Follow-up Information    [facility]. Go in 3 day(s).   Why:  Please attend a walk in appt at 8:30am, Monday-Friday.  Please request medication managment and outpatient therapy services. Contact information: [address] [place], [place] [address] P: [phone] F: [phone]       [facility]. Go on 09/05/2018.   Why:  Please go to [facility] and request admission to their shelter and substance abuse program. Contact information: [place]y [address] [place], [place] [address] P: [phone]          Next level of care provider has access to [facility] Link:no  Safety Planning and Suicide Prevention discussed: No. Pt declined.   Have you used any form of tobacco in the last 30 days? (Cigarettes, Smokeless Tobacco, Cigars, and/or Pipes): Yes  Has patient been referred to the Quitline?: Patient refused referral  Patient has been referred for addiction treatment: Yes  Lorri Frederick, LCSW 09/05/2018, 9:53 AM

## 2018-09-05 NOTE — BHH Suicide Risk Assessment (Signed)
[facility] Discharge Suicide Risk Assessment   Principal Problem: Schizoaffective disorder, bipolar type [facility]) Discharge Diagnoses: Principal Problem:   Schizoaffective disorder, bipolar type (HCC)   Total Time spent with patient: 45 minutes  Currently is alert and oriented and cooperative without thoughts of harming self contracting fully no acute cravings tremors or withdrawal no acute psychosis no instability of mood  Mental Status Per Nursing Assessment::   On Admission:  Suicidal ideation indicated by patient, Plan includes specific time, place, or method  Demographic Factors:  Low socioeconomic status  Loss Factors: Decrease in vocational status  Historical Factors: NA  Risk Reduction Factors:   Religious beliefs about death  Continued Clinical Symptoms:  Alcohol/Substance Abuse/Dependencies  Cognitive Features That Contribute To Risk:  Polarized thinking    Suicide Risk:  Minimal: No identifiable suicidal ideation.  Patients presenting with no risk factors but with morbid ruminations; may be classified as minimal risk based on the severity of the depressive symptoms    Plan Of Care/Follow-up recommendations:  Activity:  full  Jahni Nazar, MD 09/05/2018, 8:44 AM

## 2018-09-05 NOTE — Progress Notes (Signed)
CSW spoke with [employer], [place], who requested to speak with pt on the phone.  Pt did speak with her and discussed the program.  Pt was told to have a "back up plan" in case she was not admitted, which pt does not have due to being homeless.  CSW made several attempts to recontact [place] but was unable to speak to her.  Pt given extra bus pass to return to [facility] in the event that [facility] will not admit her to the program. Garner Nash, MSW, LCSW Clinical Social Worker 09/05/2018 9:43 AM

## 2018-09-05 NOTE — Discharge Summary (Signed)
Physician Discharge Summary Note  Patient:  [name] is an 28 y.o., female  MRN:  [id number]  DOB:  12/01/1990  Patient phone:  [phone] (home)   Patient address:   [address] [place] [address],   Total Time spent with patient: Greater than 30 minutes  Date of Admission:  09/03/2018  Date of Discharge: 09-05-18  Reason for Admission: Worsening drug use   Principal Problem: Schizoaffective disorder, bipolar type [facility](HCC)  Discharge Diagnoses: Patient Active Problem List   Diagnosis Date Noted  . Schizoaffective disorder, bipolar type (HCC) [F25.0] 06/15/2015    Priority: High  . Cocaine use disorder, severe, dependence (HCC) [F14.20] 06/15/2015    Priority: Medium  . Severe recurrent major depression without psychotic features (HCC) [F33.2] 06/26/2018  . MDD (major depressive disorder), recurrent, severe, with psychosis (HCC) [F33.3] 10/31/2017  . Hyperprolactinemia (HCC) [E22.1] 06/17/2015  . Cocaine use disorder (HCC) [F14.10] 06/15/2015  . Alcohol use disorder, moderate, dependence (HCC) [F10.20] 06/15/2015  . PTSD (post-traumatic stress disorder) [F43.10] 06/15/2015  . Cannabis abuse [F12.10] 06/15/2015  . Rectal ulceration [K62.6] 07/08/2014  . Rectal bleeding [K62.5] 07/05/2014  . Rectal bleed [K62.5] 07/05/2014   Past Psychiatric History: Schizoaffective disorder  Past Medical History:  Past Medical History:  Diagnosis Date  . ADHD (attention deficit hyperactivity disorder)   . Anxiety   . Anxiety disorder   . Constipation   . Depression   . Mental disorder   . Personality disorder (HCC)   . Substance abuse [facility](HCC)     Past Surgical History:  Procedure Laterality Date  . CESAREAN SECTION    . FLEXIBLE SIGMOIDOSCOPY N/A 07/06/2014   Procedure: FLEXIBLE SIGMOIDOSCOPY;  Surgeon: Florencia Reasonsobert Buccini V, MD;  [facility];  Service: Endoscopy;  Laterality: N/A;  . HERNIA REPAIR     Family History:  Family History  Problem Relation  Age of Onset  . Schizophrenia Mother   . Alcoholism Brother   . Hypertension Maternal Grandfather   . Diabetes Maternal Grandfather   . Alcohol abuse Neg Hx   . Arthritis Neg Hx   . Asthma Neg Hx   . Birth defects Neg Hx   . Cancer Neg Hx   . COPD Neg Hx   . Depression Neg Hx   . Drug abuse Neg Hx   . Early death Neg Hx   . Hearing loss Neg Hx   . Heart disease Neg Hx   . Hyperlipidemia Neg Hx   . Kidney disease Neg Hx   . Learning disabilities Neg Hx   . Mental illness Neg Hx   . Mental retardation Neg Hx   . Miscarriages / Stillbirths Neg Hx   . Stroke Neg Hx   . Vision loss Neg Hx   . Varicose Veins Neg Hx    Family Psychiatric  History: See H&P  Social History:  Social History   Substance and Sexual Activity  Alcohol Use Yes  . Alcohol/week: 2.0 standard drinks  . Types: 2 Cans of beer per week   Comment: Per pt 1 pint of liquor daily     Social History   Substance and Sexual Activity  Drug Use Yes  . Types: Cocaine, "Crack" cocaine, Marijuana    Social History   Socioeconomic History  . Marital status: Single    Spouse name: Not on file  . Number of children: 3  . Years of education: Not on file  . Highest education level: Not on file  Occupational History  .  Not on file  Social Needs  . Financial resource strain: Not on file  . Food insecurity:    Worry: Not on file    Inability: Not on file  . Transportation needs:    Medical: Not on file    Non-medical: Not on file  Tobacco Use  . Smoking status: Current Every Day Smoker    Packs/day: 1.00    Types: Cigarettes  . Smokeless tobacco: Never Used  Substance and Sexual Activity  . Alcohol use: Yes    Alcohol/week: 2.0 standard drinks    Types: 2 Cans of beer per week    Comment: Per pt 1 pint of liquor daily  . Drug use: Yes    Types: Cocaine, "Crack" cocaine, Marijuana  . Sexual activity: Yes    Birth control/protection: None  Lifestyle  . Physical activity:    Days per week: Not on  file    Minutes per session: Not on file  . Stress: Not on file  Relationships  . Social connections:    Talks on phone: Not on file    Gets together: Not on file    Attends religious service: Not on file    Active member of club or organization: Not on file    Attends meetings of clubs or organizations: Not on file    Relationship status: Not on file  Other Topics Concern  . Not on file  Social History Narrative  . Not on file   Hospital Course: (Per Md's admission evaluation):This is one of the several admission assessment in this [facility] alone for this 28 year old AA female. Renetta is known on this unit from previous hospitalizations for mood stabilization treatments. She is also a well known cocaine abuse & majority of the times, non-compliant to her treatment regimen. She is being admitted to [facility] this time with complaints of worsening suicidal ideations with plans to hang herself. She was discharged from this [facility] hospital last December, 2019 with an appointment to continue mental health care on an outpatient basis. During this admissionnassessment, [name] reports, "My boyfriend brought me here yesterday & I was sent to the [facility] for medical clearance. I had told my boyfriend some time yesterday that I needed help with my drug use. I have been using crack & other drugs too. I have been snorting them since I was 16. I use drugs because my medicines don't work so, I stopped taking them. The only medicines that work for are Zyprexa & Remeron. I started feeling like hurting myself 2 weeks ago, then it got worse. I have been thinking about killing myself because the voices keep bothering me. I just need to go to a rehab place for substance abuse treatment. Also, I don't have an outpatient provider. I have been to [facility], I just don't like them".   This is one of several discharge summaries for this 28 year old female with hx of mental illness & substance use disorders. She was recently  discharged from this [facility] hospital on 06-29-18 after mood stabilization treatments. She does have a hx of medication non-compliance. She came back to [facility] complaining of worsening depression this seeking mood stabilization & help with her drug addiction. She was admitted & recommended for mood stabilization treatments.  After evaluation of her presenting symptoms. Zeniyah was started & stabilized on the medications as listed below. She was enrolled & participated in the group counseling sessions being offered & held on this unit, she learned coping skills. She  presented no other medical issues that required treatments. She tolerated her treatment regimen without any adverse effects or reactions reported.  Today upon her discharge evaluation, Alexyia states, "I'm feeling bette". Pt denies any SI/HI/AH/VH. She has been sleeping well. Her appetite is good. She denies physical complaints today. She is in agreement to continue her current regimen without changes. She was able to engage in safety planning including [facility] to [facility] or contact emergency services if she feels unable to maintain her own safety or the safety of others. Pt had no further questions, comments, or concerns.  Upon discharge, [name] presents mentally & medically stable. She will continue mental health care on an outpatient basis as noted below. [facility], a 7 days worth, supply samples of [facility]. [facility] personal belongings in no apparent distress. Transportation per her arrangement.  Physical Findings: AIMS: Facial and Oral Movements Muscles of Facial Expression: None, normal Lips and Perioral Area: None, normal Jaw: None, normal Tongue: None, normal,Extremity Movements Upper (arms, wrists, hands, fingers): None, normal Lower (legs, knees, ankles, toes): None, normal, Trunk Movements Neck, shoulders, hips: None, normal, Overall Severity Severity of abnormal  movements (highest score from questions above): None, normal Incapacitation due to abnormal movements: None, normal Patient's awareness of abnormal movements (rate only patient's report): No Awareness, Dental Status Current problems with teeth and/or dentures?: No Does patient usually wear dentures?: No  CIWA:  CIWA-Ar Total: 0 COWS:     Musculoskeletal: Strength & Muscle Tone: within normal limits Gait & Station: normal Patient leans: N/A  Psychiatric Specialty Exam: Physical Exam  Nursing note and vitals reviewed. Constitutional: She appears well-developed.  HENT:  Head: Normocephalic.  Eyes: Pupils are equal, round, and reactive to light.  Neck: Normal range of motion.  Cardiovascular: Normal rate.  Respiratory: Effort normal.  GI: Soft.  Genitourinary:    Genitourinary Comments: Deferred   Musculoskeletal: Normal range of motion.  Neurological: She is alert.  Skin: Skin is warm.    Review of Systems  Constitutional: Negative.   HENT: Negative.   Eyes: Negative.   Respiratory: Negative.  Negative for cough and shortness of breath.   Cardiovascular: Negative.  Negative for chest pain and palpitations.  Gastrointestinal: Negative.  Negative for abdominal pain, heartburn, nausea and vomiting.  Genitourinary: Negative.   Musculoskeletal: Negative.   Skin: Negative.   Neurological: Negative.  Negative for dizziness and headaches.  Endo/Heme/Allergies: Negative.   Psychiatric/Behavioral: Positive for depression (Stable), hallucinations (Hx. AVH & tactile hallucinations (Stable)) and substance abuse (Hx. Alcohol & Cocaine use disorder). Negative for memory loss and suicidal ideas. The patient has insomnia (Stable). The patient is not nervous/anxious (Stable).     Blood pressure (!) 142/77, pulse (!) 114, temperature 97.7 F (36.5 C), temperature source Oral, resp. rate 16, height 5\' 5"  (1.651 m), weight 77.6 kg.Body mass index is 28.46 kg/m.  See Md's discharge SRA   Have  you used any form of tobacco in the last 30 days? (Cigarettes, Smokeless Tobacco, Cigars, and/or Pipes): Yes  Has this patient used any form of tobacco in the last 30 days? (Cigarettes, Smokeless Tobacco, Cigars, and/or Pipes): Yes, an FDA-approved tobacco cessation medication was offered at discharge.  Blood Alcohol level:  Lab Results  Component Value Date   [facility] 09/01/2018   ETH <10 06/25/2018   Metabolic Disorder Labs:  Lab Results  Component Value Date   HGBA1C 5.0 06/26/2018   MPG 96.8 06/26/2018  MPG 100 08/07/2014   Lab Results  Component Value Date   PROLACTIN 33.2 (H) 06/16/2015   Lab Results  Component Value Date   CHOL 168 06/26/2018   TRIG 36 06/26/2018   HDL 83 06/26/2018   CHOLHDL 2.0 06/26/2018   VLDL 7 06/26/2018   LDLCALC 78 06/26/2018   LDLCALC 50 08/07/2014   See Psychiatric Specialty Exam and Suicide Risk Assessment completed by Attending Physician prior to discharge.  Discharge destination:  Home  Is patient on multiple antipsychotic therapies at discharge: Yes (Seroquel & Perphenazine).    Do you recommend tapering to monotherapy for antipsychotics?  Yes    Has Patient had three or more failed trials of antipsychotic monotherapy by history:  Yes,   Antipsychotic medications that previously failed include:   1.  Abilify., 2.  Seroquel. and 3.  Haldol.  Recommended Plan for Multiple Antipsychotic Therapies: Taper to monotherapy as described:  Per her Outpatient provider, when her symptoms improve or stabilize. This is to prevent the chances for developing metabolic syndrome associated with use of multiple antipsychostic.  Allergies as of 09/05/2018      Reactions   Perphenazine Swelling   "Tongue swelling" See 12/27 H&P   Latex Hives, Swelling   Patient states vaginal swelling occurs if a latex condom was used.   Haldol [haloperidol Lactate] Swelling   Risperdal [risperidone] Swelling      Medication List    STOP taking these medications    diphenhydrAMINE 25 MG tablet Commonly known as:  BENADRYL   ibuprofen 200 MG tablet Commonly known as:  ADVIL,MOTRIN   NEXPLANON 68 MG Impl implant Generic drug:  etonogestrel   perphenazine 2 MG tablet Commonly known as:  TRILAFON   sodium chloride 0.65 % Soln nasal spray Commonly known as:  OCEAN   traZODone 50 MG tablet Commonly known as:  DESYREL     TAKE these medications     Indication  FLUoxetine 20 MG capsule Commonly known as:  PROZAC Take 1 capsule (20 mg total) by mouth daily. For depression  Indication:  Major Depressive Disorder   gabapentin 300 MG capsule Commonly known as:  NEURONTIN Take 1 capsule (300 mg total) by mouth 3 (three) times daily. For agitation  Indication:  Agitation   hydrOXYzine 25 MG tablet Commonly known as:  ATARAX/VISTARIL Take 1 tablet (25 mg total) by mouth 3 (three) times daily as needed for anxiety. What changed:  additional instructions  Indication:  Feeling Anxious   mirtazapine 15 MG disintegrating tablet Commonly known as:  REMERON SOL-TAB Take 1 tablet (15 mg total) by mouth at bedtime. For depression/sleep  Indication:  Major Depressive Disorder, Insomnia   nicotine polacrilex 2 MG gum Commonly known as:  NICORETTE Take 1 each (2 mg total) by mouth as needed for smoking cessation. (May buy from over the counter): For smoking cessation  Indication:  Nicotine Addiction   QUEtiapine 200 MG 24 hr tablet Commonly known as:  SEROQUEL XR Take 1 tablet (200 mg total) by mouth at bedtime. For mood control What changed:    medication strength  how much to take  Indication:  Mood control      Follow-up Information    [facility]. Go in 3 day(s).   Why:  Please attend a walk in appt at 8:30am, Monday-Friday.  Please request medication managment and outpatient therapy services. Contact information: [address] [place], [place] [address] P: [phone] F: [phone]       [facility].  Go on 09/05/2018.   Why:  Please go to [facility] and request admission to their shelter and substance abuse program. Contact information: H[place]y Romans [address] [place], [place] [address] P: [phone]         Follow-up recommendations: Activity:  As tolerated Diet: As recommended by your primary care doctor. Keep all scheduled follow-up appointments as recommended.   Comments: Patient is instructed prior to discharge to: Take all medications as prescribed by his/her mental healthcare provider. Report any adverse effects and or reactions from the medicines to his/her outpatient provider promptly. Patient has been instructed & cautioned: To not engage in alcohol and or illegal drug use while on prescription medicines. In the event of worsening symptoms, patient is instructed to call the crisis hotline, 911 and or go to the nearest ED for appropriate evaluation and treatment of symptoms. To follow-up with his/her primary care provider for your other medical issues, concerns and or health care needs.   Signed: Armandina Stammer, NP, PMHNP, FNP-BC 09/05/2018, 10:03 AM

## 2018-09-05 NOTE — Progress Notes (Signed)
Patient ID: [name], female   DOB: 1990/11/29, 28 y.o.   MRN: [id number] Patient discharged to [name] for continued treatment.  Patient denies SI, HI an AVH upon discharge.  Patient stated that she wants recovery and hopes to plan to stay clean and sober. Patient acknowledges understanding of all discharge instruction and receipt of all personal belongings.

## 2018-09-05 NOTE — Progress Notes (Signed)
Recreation Therapy Notes  INPATIENT RECREATION TR PLAN  Patient Details Name: [name] MRN: [id number] DOB: 1990/08/03 Today's Date: 09/05/2018  Rec Therapy Plan Is patient appropriate for Therapeutic Recreation?: Yes Treatment times per week: about 3 days Estimated Length of Stay: 5-7 days TR Treatment/Interventions: Group participation (Santos)  Discharge Criteria Pt will be discharged from therapy if:: Discharged Treatment plan/goals/alternatives discussed and agreed upon by:: Patient/family  Discharge Summary Short term goals set: See patient care plan Short term goals met: Adequate for discharge Progress toward goals comments: Groups attended Which groups?: Coping skills Reason goals not met: None Therapeutic equipment acquired: N/Santos Reason patient discharged from therapy: Discharge from hospital Pt/family agrees with progress & goals achieved: Yes Date patient discharged from therapy: 09/05/18     [name], [name]  [name], [name] 09/05/2018, 11:01 AM

## 2018-09-05 NOTE — Plan of Care (Signed)
Pt did not identify triggers but was able to identify coping skills at completion of recreation therapy group session.   Caroll Rancher, LRT/CTRS

## 2018-10-25 ENCOUNTER — Inpatient Hospital Stay (HOSPITAL_COMMUNITY)
Admission: AD | Admit: 2018-10-25 | Discharge: 2018-10-29 | DRG: 885 | Disposition: A | Discharge status: Home / Self Care | Payer: Medicaid Other | Source: Intra-hospital | Attending: Psychiatry | Admitting: Psychiatry

## 2018-10-25 ENCOUNTER — Encounter (HOSPITAL_COMMUNITY): Payer: Self-pay

## 2018-10-25 ENCOUNTER — Emergency Department (HOSPITAL_COMMUNITY)
Admission: EM | Admit: 2018-10-25 | Discharge: 2018-10-25 | Disposition: A | Discharge status: Other Acute Inpatient Hospital | Payer: Medicaid Other | Attending: Emergency Medicine | Admitting: Emergency Medicine

## 2018-10-25 ENCOUNTER — Other Ambulatory Visit: Payer: Self-pay

## 2018-10-25 DIAGNOSIS — F251 Schizoaffective disorder, depressive type: Secondary | ICD-10-CM | POA: Diagnosis present

## 2018-10-25 DIAGNOSIS — Z79899 Other long term (current) drug therapy: Secondary | ICD-10-CM

## 2018-10-25 DIAGNOSIS — F332 Major depressive disorder, recurrent severe without psychotic features: Secondary | ICD-10-CM | POA: Diagnosis present

## 2018-10-25 DIAGNOSIS — F250 Schizoaffective disorder, bipolar type: Secondary | ICD-10-CM | POA: Diagnosis not present

## 2018-10-25 DIAGNOSIS — F1020 Alcohol dependence, uncomplicated: Secondary | ICD-10-CM | POA: Diagnosis present

## 2018-10-25 DIAGNOSIS — Z888 Allergy status to other drugs, medicaments and biological substances status: Secondary | ICD-10-CM | POA: Diagnosis not present

## 2018-10-25 DIAGNOSIS — R41843 Psychomotor deficit: Secondary | ICD-10-CM | POA: Diagnosis present

## 2018-10-25 DIAGNOSIS — X58XXXA Exposure to other specified factors, initial encounter: Secondary | ICD-10-CM | POA: Diagnosis present

## 2018-10-25 DIAGNOSIS — Z9114 Patient's other noncompliance with medication regimen: Secondary | ICD-10-CM | POA: Diagnosis not present

## 2018-10-25 DIAGNOSIS — Z91040 Latex allergy status: Secondary | ICD-10-CM | POA: Diagnosis not present

## 2018-10-25 DIAGNOSIS — Y999 Unspecified external cause status: Secondary | ICD-10-CM | POA: Insufficient documentation

## 2018-10-25 DIAGNOSIS — F1420 Cocaine dependence, uncomplicated: Secondary | ICD-10-CM | POA: Diagnosis present

## 2018-10-25 DIAGNOSIS — Z590 Homelessness: Secondary | ICD-10-CM | POA: Diagnosis not present

## 2018-10-25 DIAGNOSIS — Z818 Family history of other mental and behavioral disorders: Secondary | ICD-10-CM

## 2018-10-25 DIAGNOSIS — Z811 Family history of alcohol abuse and dependence: Secondary | ICD-10-CM

## 2018-10-25 DIAGNOSIS — S6992XA Unspecified injury of left wrist, hand and finger(s), initial encounter: Secondary | ICD-10-CM | POA: Diagnosis present

## 2018-10-25 DIAGNOSIS — Y900 Blood alcohol level of less than 20 mg/100 ml: Secondary | ICD-10-CM | POA: Diagnosis present

## 2018-10-25 DIAGNOSIS — F329 Major depressive disorder, single episode, unspecified: Secondary | ICD-10-CM | POA: Insufficient documentation

## 2018-10-25 DIAGNOSIS — Y929 Unspecified place or not applicable: Secondary | ICD-10-CM | POA: Insufficient documentation

## 2018-10-25 DIAGNOSIS — T510X1A Toxic effect of ethanol, accidental (unintentional), initial encounter: Secondary | ICD-10-CM | POA: Diagnosis present

## 2018-10-25 DIAGNOSIS — G249 Dystonia, unspecified: Secondary | ICD-10-CM | POA: Diagnosis not present

## 2018-10-25 DIAGNOSIS — R45851 Suicidal ideations: Secondary | ICD-10-CM | POA: Diagnosis not present

## 2018-10-25 DIAGNOSIS — F419 Anxiety disorder, unspecified: Secondary | ICD-10-CM | POA: Diagnosis present

## 2018-10-25 DIAGNOSIS — F909 Attention-deficit hyperactivity disorder, unspecified type: Secondary | ICD-10-CM | POA: Diagnosis present

## 2018-10-25 DIAGNOSIS — F609 Personality disorder, unspecified: Secondary | ICD-10-CM | POA: Diagnosis present

## 2018-10-25 DIAGNOSIS — F17210 Nicotine dependence, cigarettes, uncomplicated: Secondary | ICD-10-CM | POA: Insufficient documentation

## 2018-10-25 DIAGNOSIS — Z046 Encounter for general psychiatric examination, requested by authority: Secondary | ICD-10-CM | POA: Diagnosis not present

## 2018-10-25 DIAGNOSIS — S61512A Laceration without foreign body of left wrist, initial encounter: Secondary | ICD-10-CM | POA: Insufficient documentation

## 2018-10-25 DIAGNOSIS — E876 Hypokalemia: Secondary | ICD-10-CM | POA: Diagnosis not present

## 2018-10-25 DIAGNOSIS — Y9389 Activity, other specified: Secondary | ICD-10-CM | POA: Insufficient documentation

## 2018-10-25 DIAGNOSIS — T1491XA Suicide attempt, initial encounter: Secondary | ICD-10-CM | POA: Insufficient documentation

## 2018-10-25 DIAGNOSIS — X788XXA Intentional self-harm by other sharp object, initial encounter: Secondary | ICD-10-CM | POA: Diagnosis not present

## 2018-10-25 LAB — CBC
HCT: 40.5 % (ref 36.0–46.0)
Hemoglobin: 13 g/dL (ref 12.0–15.0)
MCH: 29.5 pg (ref 26.0–34.0)
MCHC: 32.1 g/dL (ref 30.0–36.0)
MCV: 92 fL (ref 80.0–100.0)
Platelets: 339 10*3/uL (ref 150–400)
RBC: 4.4 MIL/uL (ref 3.87–5.11)
RDW: 13.5 % (ref 11.5–15.5)
WBC: 6.2 10*3/uL (ref 4.0–10.5)
nRBC: 0 % (ref 0.0–0.2)

## 2018-10-25 LAB — COMPREHENSIVE METABOLIC PANEL
ALT: 17 U/L (ref 0–44)
AST: 24 U/L (ref 15–41)
Albumin: 4.5 g/dL (ref 3.5–5.0)
Alkaline Phosphatase: 154 U/L — ABNORMAL HIGH (ref 38–126)
Anion gap: 12 (ref 5–15)
BUN: 9 mg/dL (ref 6–20)
CO2: 22 mmol/L (ref 22–32)
Calcium: 8.9 mg/dL (ref 8.9–10.3)
Chloride: 103 mmol/L (ref 98–111)
Creatinine, Ser: 0.74 mg/dL (ref 0.44–1.00)
GFR calc Af Amer: >60 mL/min (ref >60)
GFR calc non Af Amer: >60 mL/min (ref >60)
Glucose, Bld: 99 mg/dL (ref 70–99)
Potassium: 3.1 mmol/L — ABNORMAL LOW (ref 3.5–5.1)
Sodium: 137 mmol/L (ref 135–145)
Total Bilirubin: 0.4 mg/dL (ref 0.3–1.2)
Total Protein: 8.4 g/dL — ABNORMAL HIGH (ref 6.5–8.1)

## 2018-10-25 LAB — SALICYLATE LEVEL: Salicylate Lvl: <7 mg/dL (ref 2.8–30.0)

## 2018-10-25 LAB — ETHANOL: Alcohol, Ethyl (B): <10 mg/dL (ref <10)

## 2018-10-25 LAB — RAPID URINE DRUG SCREEN, HOSP PERFORMED
Amphetamines: NOT DETECTED
Barbiturates: NOT DETECTED
Benzodiazepines: NOT DETECTED
Cocaine: POSITIVE — AB
Opiates: NOT DETECTED
Tetrahydrocannabinol: NOT DETECTED

## 2018-10-25 LAB — I-STAT BETA HCG BLOOD, ED (MC, WL, AP ONLY): I-stat hCG, quantitative: <5 m[IU]/mL (ref <5)

## 2018-10-25 LAB — ACETAMINOPHEN LEVEL: Acetaminophen (Tylenol), Serum: <10 ug/mL — ABNORMAL LOW (ref 10–30)

## 2018-10-25 MED ORDER — TRAZODONE HCL 50 MG PO TABS: 50.0000 mg | ORAL_TABLET | Freq: Every evening | ORAL | Status: DC | PRN

## 2018-10-25 MED ORDER — VITAMIN B-1 100 MG PO TABS
100.0000 mg | ORAL_TABLET | Freq: Every day | ORAL | Status: DC
  Administered 2018-10-25: 100 mg via ORAL
  Filled 2018-10-25: qty 1

## 2018-10-25 MED ORDER — ALUM & MAG HYDROXIDE-SIMETH 200-200-20 MG/5ML PO SUSP: 30.0000 mL | ORAL | Status: DC | PRN

## 2018-10-25 MED ORDER — OLANZAPINE 5 MG PO TABS
5.0000 mg | ORAL_TABLET | Freq: Two times a day (BID) | ORAL | Status: DC
  Administered 2018-10-25 – 2018-10-29 (×8): 5 mg via ORAL
  Filled 2018-10-25 (×11): qty 1
  Filled 2018-10-25: qty 2

## 2018-10-25 MED ORDER — LORAZEPAM 1 MG PO TABS: 0.0000 mg | ORAL_TABLET | Freq: Four times a day (QID) | ORAL | Status: DC

## 2018-10-25 MED ORDER — VITAMIN B-1 100 MG PO TABS
100.0000 mg | ORAL_TABLET | Freq: Every day | ORAL | Status: DC
  Administered 2018-10-25 – 2018-10-29 (×5): 100 mg via ORAL
  Filled 2018-10-25 (×8): qty 1

## 2018-10-25 MED ORDER — FLUOXETINE HCL 20 MG PO CAPS
20.0000 mg | ORAL_CAPSULE | Freq: Every day | ORAL | Status: DC
  Administered 2018-10-26 – 2018-10-29 (×4): 20 mg via ORAL
  Filled 2018-10-25 (×7): qty 1

## 2018-10-25 MED ORDER — HYDROXYZINE HCL 25 MG PO TABS
25.0000 mg | ORAL_TABLET | Freq: Three times a day (TID) | ORAL | Status: DC | PRN
  Administered 2018-10-29: 14:00:00 25 mg via ORAL

## 2018-10-25 MED ORDER — NICOTINE POLACRILEX 2 MG MT GUM: 2.0000 mg | CHEWING_GUM | OROMUCOSAL | Status: DC | PRN

## 2018-10-25 MED ORDER — VITAMIN B-1 100 MG PO TABS
100.0000 mg | ORAL_TABLET | Freq: Every day | ORAL | Status: DC
  Filled 2018-10-25: qty 1

## 2018-10-25 MED ORDER — LORAZEPAM 1 MG PO TABS: 0.0000 mg | ORAL_TABLET | Freq: Two times a day (BID) | ORAL | Status: DC

## 2018-10-25 MED ORDER — LORAZEPAM 2 MG/ML IJ SOLN: 0.0000 mg | Freq: Four times a day (QID) | INTRAMUSCULAR | Status: DC

## 2018-10-25 MED ORDER — ACETAMINOPHEN 325 MG PO TABS
ORAL_TABLET | ORAL | Status: AC
  Filled 2018-10-25: qty 2

## 2018-10-25 MED ORDER — ACETAMINOPHEN 325 MG PO TABS
650.0000 mg | ORAL_TABLET | Freq: Once | ORAL | Status: AC
  Administered 2018-10-25: 10:00:00 650 mg via ORAL
  Filled 2018-10-25: qty 2

## 2018-10-25 MED ORDER — ACETAMINOPHEN 325 MG PO TABS
650.0000 mg | ORAL_TABLET | Freq: Four times a day (QID) | ORAL | Status: DC | PRN
  Administered 2018-10-25 – 2018-10-28 (×6): 650 mg via ORAL
  Filled 2018-10-25 (×5): qty 2

## 2018-10-25 MED ORDER — MAGNESIUM HYDROXIDE 400 MG/5ML PO SUSP: 30.0000 mL | Freq: Every day | ORAL | Status: DC | PRN

## 2018-10-25 MED ORDER — HYDROXYZINE HCL 25 MG PO TABS: 25.0000 mg | ORAL_TABLET | Freq: Three times a day (TID) | ORAL | Status: DC | PRN

## 2018-10-25 MED ORDER — THIAMINE HCL 100 MG/ML IJ SOLN: 100.0000 mg | Freq: Every day | INTRAMUSCULAR | Status: DC

## 2018-10-25 MED ORDER — GABAPENTIN 300 MG PO CAPS
300.0000 mg | ORAL_CAPSULE | Freq: Three times a day (TID) | ORAL | Status: DC
  Administered 2018-10-25: 300 mg via ORAL
  Filled 2018-10-25: qty 1

## 2018-10-25 MED ORDER — LORAZEPAM 2 MG/ML IJ SOLN: 0.0000 mg | Freq: Two times a day (BID) | INTRAMUSCULAR | Status: DC

## 2018-10-25 MED ORDER — POTASSIUM CHLORIDE CRYS ER 20 MEQ PO TBCR
40.0000 meq | EXTENDED_RELEASE_TABLET | Freq: Once | ORAL | Status: AC
  Administered 2018-10-25: 40 meq via ORAL
  Filled 2018-10-25: qty 2

## 2018-10-25 MED ORDER — FLUOXETINE HCL 20 MG PO CAPS
20.0000 mg | ORAL_CAPSULE | Freq: Every day | ORAL | Status: DC
  Administered 2018-10-25: 10:00:00 20 mg via ORAL
  Filled 2018-10-25: qty 1

## 2018-10-25 MED ORDER — GABAPENTIN 300 MG PO CAPS
300.0000 mg | ORAL_CAPSULE | Freq: Three times a day (TID) | ORAL | Status: DC
  Administered 2018-10-25 – 2018-10-29 (×11): 300 mg via ORAL
  Filled 2018-10-25 (×17): qty 1

## 2018-10-25 MED ORDER — MIRTAZAPINE 15 MG PO TABS
15.0000 mg | ORAL_TABLET | Freq: Every day | ORAL | Status: DC
  Administered 2018-10-26 – 2018-10-28 (×3): 15 mg via ORAL
  Filled 2018-10-25 (×7): qty 1
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
## 2018-10-25 NOTE — Progress Notes (Signed)
Admission Note: Patient is a 28 years old female admitted to the unit for depression and suicidal ideation.  Reports auditory and visual hallucinations.  Presents with flat affect and depressed mood.  Patient is irritable and crying on approach and during assessment.  States she is here for medication and to learn coping skills.  Admission plan of care reviewed and consent signed.  Skin assessment and personal belongings completed.  Laceration noted on both arms with staples on left wrist.  Patient oriented to the unit, staff and room.  Routine safety checks initiated.  Patient is safe on the unit.
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
## 2018-10-25 NOTE — ED Notes (Addendum)
Pt changed into maroon scrubs. Pt was advised that her lip piercing has to be removed. Pt refused saying, "I never take it out." Pt was provided a urine cup to place her lip jewelry in when it is removed.

## 2018-10-25 NOTE — BH Assessment (Signed)
[facility] Assessment Progress Note  Per Juanetta Beets, DO, this pt requires psychiatric hospitalization at this time.  Malva Limes, RN, [facility] has assigned pt to [facility] Rm 500-2; [facility] will be ready to receive pt between 12:00 and 12:30.  Pt has signed Voluntary Admission and Consent for Treatment, as well as Consent to Release Information to no one, and signed forms have been faxed to [facility].  Pt's nurse, Kendal Hymen, has been notified, and agrees to send original paperwork along with pt via Juel Burrow, and to call report to [phone].  Doylene Canning, [place] [facility] Coordinator [phone]
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
## 2018-10-25 NOTE — Tx Team (Signed)
Initial Treatment Plan 10/25/2018 2:01 PM [name] [facility]:[id number]    PATIENT STRESSORS: Financial difficulties Marital or family conflict   PATIENT STRENGTHS: Ability for insight Communication skills   PATIENT IDENTIFIED PROBLEMS: "medicine"  "learn coping skills"  Suicidal Ideation  Depression  Anxiety             DISCHARGE CRITERIA:  Motivation to continue treatment in a less acute level of care Reduction of life-threatening or endangering symptoms to within safe limits  PRELIMINARY DISCHARGE PLAN: Attend aftercare/continuing care group Outpatient therapy Placement in alternative living arrangements  PATIENT/FAMILY INVOLVEMENT: This treatment plan has been presented to and reviewed with the patient, [name], and/or family member.  The patient and family have been given the opportunity to ask questions and make suggestions.  Clarene Critchley, RN 10/25/2018, 2:01 PM

## 2018-10-25 NOTE — [facility] Group Notes (Signed)
[facility] Group Notes:  (Nursing/MHT/Case Management/Adjunct)  Date:  10/25/2018  Time:  4:00 PM  Type of Therapy:  Nurse Education  Participation Level:  Did Not Attend   Raylene Miyamoto 10/25/2018, 5:18 PM

## 2018-10-25 NOTE — ED Notes (Signed)
Bed: [facility] Expected date:  Expected time:  Means of arrival:  Comments: 

## 2018-10-25 NOTE — ED Notes (Signed)
Pt arrived to room 36 tearful.  Pt was oriented to room and unit.  15 minute checks and video monitoring initiated .
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
## 2018-10-25 NOTE — [facility] Group Notes (Signed)
[facility] LCSW Group Therapy Note  Date/Time: 10/25/18, 1315  Type of Therapy/Topic:  Group Therapy:  Balance in Life  Participation Level:  Did not attend  Description of Group:    This group will address the concept of balance and how it feels and looks when one is unbalanced. Patients will be encouraged to process areas in their lives that are out of balance, and identify reasons for remaining unbalanced. Facilitators will guide patients utilizing problem- solving interventions to address and correct the stressor making their life unbalanced. Understanding and applying boundaries will be explored and addressed for obtaining  and maintaining a balanced life. Patients will be encouraged to explore ways to assertively make their unbalanced needs known to significant others in their lives, using other group members and facilitator for support and feedback.  Therapeutic Goals: 1. Patient will identify two or more emotions or situations they have that consume much of in their lives. 2. Patient will identify signs/triggers that life has become out of balance:  3. Patient will identify two ways to set boundaries in order to achieve balance in their lives:  4. Patient will demonstrate ability to communicate their needs through discussion and/or role plays  Summary of Patient Progress:          Therapeutic Modalities:   Cognitive Behavioral Therapy Solution-Focused Therapy Assertiveness Training  Greg Tamberly Pomplun, LCSW 

## 2018-10-25 NOTE — Progress Notes (Signed)
Psychoeducational Group Note  Date:  10/25/2018 Time:  2041  Group Topic/Focus:  Wrap-Up Group:   The focus of this group is to help patients review their daily goal of treatment and discuss progress on daily workbooks.  Participation Level: Did Not Attend  Participation Quality:  Not Applicable  Affect:  Not Applicable  Cognitive:  Not Applicable  Insight:  Not Applicable  Engagement in Group: Not Applicable  Additional Comments:  The patient did not attend group this evening since she was in her bedroom.   Hazle Coca S 10/25/2018, 8:41 PM
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
## 2018-10-26 NOTE — Progress Notes (Signed)
Recreation Therapy Notes  Date: 4.3.20 Time: 1000 Location: 500 Hall Dayroom  Group Topic:  Goal Setting  Goal Area(s) Addresses:  Patient will be able to identify at least 3 goals.  Patient will be able to identify benefit of investing in goals.  Patient will be able to identify benefit of setting goals.   Intervention: Worksheet, pencils, music  Activity: Goal Planning.  Patients were to identify goals they wanted to accomplish in a week, month, year and five years.  Patients were to then identify obstacles that would prevent them from reaching goals, what they need to reach their goals and what they can start doing now to work towards goals.  Education:  Discharge Planning, Coping Skills, Leisure Education   Education Outcome: Acknowledges Education/In Group Clarification Provided/Needs Additional Education  Clinical Observations:  Pt did not attend group.     Verdon Ferrante, LRT/CTRS         Muskan Bolla A 10/26/2018 12:24 PM 

## 2018-10-26 NOTE — [facility] Group Notes (Signed)
The focus of this group is to help patients establish daily goals to achieve during treatment and discuss how the patient can incorporate goal setting into their daily lives to aide in recovery.T  Pt did not attend group.
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
## 2018-10-26 NOTE — [facility] Suicide Risk Assessment (Signed)
[facility] INPATIENT:  Family/Significant Other Suicide Prevention Education  Suicide Prevention Education:  Patient Refusal for Family/Significant Other Suicide Prevention Education: The patient [name] has refused to provide written consent for family/significant other to be provided Family/Significant Other Suicide Prevention Education during admission and/or prior to discharge.  Physician notified.  Lorri Frederick, LCSW 10/26/2018, 2:47 PM
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
## 2018-10-26 NOTE — Tx Team (Signed)
Interdisciplinary Treatment and Diagnostic Plan Update  10/26/2018 Time of Session: 0902 Liliya Fullenwider MRN: [id number]  Principal Diagnosis: <principal problem not specified>  Secondary Diagnoses: Active Problems:   Schizoaffective disorder, bipolar type (HCC)   MDD (major depressive disorder), recurrent severe, without psychosis ([place])   Current Medications:  Current Facility-Administered Medications  Medication Dose Route Frequency Provider Last Rate Last Dose  . acetaminophen (TYLENOL) tablet 650 mg  650 mg Oral Q6H PRN Ethelene Hal, NP   650 mg at 10/26/18 6295  . alum & mag hydroxide-simeth (MAALOX/MYLANTA) 200-200-20 MG/5ML suspension 30 mL  30 mL Oral Q4H PRN Ethelene Hal, NP      . FLUoxetine (PROZAC) capsule 20 mg  20 mg Oral Daily Ethelene Hal, NP   20 mg at 10/26/18 0906  . gabapentin (NEURONTIN) capsule 300 mg  300 mg Oral TID Ethelene Hal, NP   300 mg at 10/26/18 1202  . hydrOXYzine (ATARAX/VISTARIL) tablet 25 mg  25 mg Oral TID PRN Ethelene Hal, NP      . magnesium hydroxide (MILK OF MAGNESIA) suspension 30 mL  30 mL Oral Daily PRN Ethelene Hal, NP      . mirtazapine (REMERON) tablet 15 mg  15 mg Oral QHS Johnn Hai, MD      . nicotine polacrilex (NICORETTE) gum 2 mg  2 mg Oral PRN Ethelene Hal, NP      . OLANZapine [facility]) tablet 5 mg  5 mg Oral BID Johnn Hai, MD   5 mg at 10/26/18 2841  . thiamine (VITAMIN B-1) tablet 100 mg  100 mg Oral Daily Minda Ditto, RPH   100 mg at 10/26/18 3244   Or  . thiamine (B-1) injection 100 mg  100 mg Intravenous Daily Minda Ditto, RPH      . traZODone (DESYREL) tablet 50 mg  50 mg Oral QHS PRN Ethelene Hal, NP       PTA Medications: Medications Prior to Admission  Medication Sig Dispense Refill Last Dose  . FLUoxetine (PROZAC) 20 MG capsule Take 1 capsule (20 mg total) by mouth daily. For depression (Patient not taking: Reported on 10/25/2018) 30  capsule 0 Not Taking at Unknown time  . gabapentin (NEURONTIN) 300 MG capsule Take 1 capsule (300 mg total) by mouth 3 (three) times daily. For agitation (Patient not taking: Reported on 10/25/2018) 90 capsule 0 Not Taking at Unknown time  . hydrOXYzine (ATARAX/VISTARIL) 25 MG tablet Take 1 tablet (25 mg total) by mouth 3 (three) times daily as needed for anxiety. (Patient not taking: Reported on 10/25/2018) 60 tablet 0 Not Taking at Unknown time  . mirtazapine (REMERON SOL-TAB) 15 MG disintegrating tablet Take 1 tablet (15 mg total) by mouth at bedtime. For depression/sleep (Patient not taking: Reported on 10/25/2018) 30 tablet 0 Not Taking at Unknown time  . nicotine polacrilex (NICORETTE) 2 MG gum Take 1 each (2 mg total) by mouth as needed for smoking cessation. (May buy from over the counter): For smoking cessation (Patient not taking: Reported on 10/25/2018) 100 tablet 0 Not Taking at Unknown time  . QUEtiapine (SEROQUEL XR) 200 MG 24 hr tablet Take 1 tablet (200 mg total) by mouth at bedtime. For mood control (Patient not taking: Reported on 10/25/2018) 30 tablet 0 Not Taking at Unknown time    Patient Stressors: Financial difficulties Marital or family conflict  Patient Strengths: Ability for insight Communication skills  Treatment Modalities: Medication Management, Group therapy, Case management,  1 to 1 session with clinician, Psychoeducation, Recreational therapy.   Physician Treatment Plan for Primary Diagnosis: <principal problem not specified> Long Term Goal(s): Improvement in symptoms so as ready for discharge Improvement in symptoms so as ready for discharge   Short Term Goals: Compliance with prescribed medications will improve Compliance with prescribed medications will improve  Medication Management: Evaluate patient's response, side effects, and tolerance of medication regimen.  Therapeutic Interventions: 1 to 1 sessions, Unit Group sessions and Medication  administration.  Evaluation of Outcomes: Not Met  Physician Treatment Plan for Secondary Diagnosis: Active Problems:   Schizoaffective disorder, bipolar type ([place])   MDD (major depressive disorder), recurrent severe, without psychosis ([place])  Long Term Goal(s): Improvement in symptoms so as ready for discharge Improvement in symptoms so as ready for discharge   Short Term Goals: Compliance with prescribed medications will improve Compliance with prescribed medications will improve     Medication Management: Evaluate patient's response, side effects, and tolerance of medication regimen.  Therapeutic Interventions: 1 to 1 sessions, Unit Group sessions and Medication administration.  Evaluation of Outcomes: Not Met   RN Treatment Plan for Primary Diagnosis: <principal problem not specified> Long Term Goal(s): Knowledge of disease and therapeutic regimen to maintain health will improve  Short Term Goals: Ability to identify and develop effective coping behaviors will improve and Compliance with prescribed medications will improve  Medication Management: RN will administer medications as ordered by provider, will assess and evaluate patient's response and provide education to patient for prescribed medication. RN will report any adverse and/or side effects to prescribing provider.  Therapeutic Interventions: 1 on 1 counseling sessions, Psychoeducation, Medication administration, Evaluate responses to treatment, Monitor vital signs and CBGs as ordered, Perform/monitor CIWA, COWS, AIMS and Fall Risk screenings as ordered, Perform wound care treatments as ordered.  Evaluation of Outcomes: Not Met   LCSW Treatment Plan for Primary Diagnosis: <principal problem not specified> Long Term Goal(s): Safe transition to appropriate next level of care at discharge, Engage patient in therapeutic group addressing interpersonal concerns.  Short Term Goals: Engage patient in aftercare planning with  referrals and resources, Increase social support and Increase skills for wellness and recovery  Therapeutic Interventions: Assess for all discharge needs, 1 to 1 time with Social worker, Explore available resources and support systems, Assess for adequacy in community support network, Educate family and significant other(s) on suicide prevention, Complete Psychosocial Assessment, Interpersonal group therapy.  Evaluation of Outcomes: Not Met   Progress in Treatment: Attending groups: No. Participating in groups: No. Taking medication as prescribed: Yes. Toleration medication: Yes. Family/Significant other contact made: No, will contact:  pt declined consent Patient understands diagnosis: Yes. Discussing patient identified problems/goals with staff: Yes. Medical problems stabilized or resolved: Yes. Denies suicidal/homicidal ideation: Yes. Issues/concerns per patient self-inventory: No. Other: none  New problem(s) identified: No, Describe:  none  New Short Term/Long Term Goal(s):  Patient Goals:  "get healed"  Discharge Plan or Barriers:   Reason for Continuation of Hospitalization: Depression Hallucinations Medication stabilization  Estimated Length of Stay: 3-5 days  Attendees: Patient:Arrow Rosita Fire 10/26/2018   Physician: Dr. Jake Samples, MD 10/26/2018   Nursing: Boyce Medici, RN 10/26/2018   RN Care Manager: 10/26/2018   Social Worker: Lurline Idol, LCSW 10/26/2018   Recreational Therapist:  10/26/2018   Other:  10/26/2018   Other:  10/26/2018   Other: 10/26/2018     Scribe for Treatment Team: Joanne Chars, LCSW 10/26/2018 1:46 PM
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
## 2018-10-26 NOTE — Progress Notes (Addendum)
Date: 10/26/18, 1330  Type of Therapy and Topic: Chaplain group, "Hope is.." Chaplain engaged group in discussion about hope and what it looks like in each patients life and current situation.  Participation level:Did not attend  Modes of Intervention: Discussion, Education and Socialization  Summary of Progress/Problems:   Olivia Cacioppo Jon, LCSW   [facility] LCSW Group Therapy Note       

## 2018-10-26 NOTE — Progress Notes (Signed)
Psychoeducational Group Note  Date:  10/26/2018 Time: 1646  Group Topic/Focus:  Recovery Goals:   The focus of this group is to identify appropriate goals for recovery and establish a plan to achieve them.  Participation Level: Did Not Attend  Participation Quality:  Not Applicable  Affect:  Not Applicable  Cognitive:  Not Applicable  Insight:  Not Applicable  Engagement in Group: Not Applicable  Additional Comments:  Pt refused to attend group this afternoon.  Fynn Adel E 10/26/2018, 5:00 PM

## 2018-10-26 NOTE — Progress Notes (Signed)
Pt observed currently asleep in bed. Respiration are even and unlabored. Pt in no sign of distress. Will continue to monitor.
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
## 2018-10-26 NOTE — Progress Notes (Signed)
Recreation Therapy Notes  INPATIENT RECREATION THERAPY ASSESSMENT  Patient Details Name: [name] MRN: [id number] DOB: 1990-09-07 Today's Date: 10/26/2018       Information Obtained From: Patient  Able to Participate in Assessment/Interview: Yes  Patient Presentation: (Depressed)  Reason for Admission (Per Patient): Suicide Attempt  Patient Stressors: Relationship, Other (Comment)(Cocaine)  Coping Skills:   Self-Injury, Isolation, Arguments, Aggression, Music, Substance Abuse, Impulsivity, Prayer, Read, Hot Bath/Shower  Leisure Interests (2+):  (Pt stated "I don't know")  Frequency of Recreation/Participation:    Awareness of Community Resources:  No  Expressed Interest in [employer] Information: No  [place] of Residence:  [place]  Patient Main Form of Transportation: Other (Comment)(Pt stated "I don't go anywhere".)  Patient Strengths:  Pt stated "I don't know"  Patient Identified Areas of Improvement:  Pt stated "I don't know"  Patient Goal for Hospitalization:  "To heal"  Current SI (including self-harm):  No  Current HI:  No  Current AVH: Yes("whole bunch of voices and black figures")  Staff Intervention Plan: Group Attendance, Collaborate with Interdisciplinary Treatment Team  Consent to Intern Participation: N/A   Caroll Rancher, LRT/CTRS  Caroll Rancher A 10/26/2018, 12:55 PM

## 2018-10-26 NOTE — Progress Notes (Signed)
Dar Note: Patient presents with flat affect and depressed mood.  Endorses suicidal thoughts but verbally contracts for safety.  Patient is withdrawn and isolates to her room.  Medication given as prescribed.  Minimal interaction with staff and peers.  Offered support and encouragement as needed.  Patient is safe on the unit.

## 2018-10-27 NOTE — Plan of Care (Signed)
Progress note  D: pt found in bed; allowed to rest. Upon awakening pt was compliant with medication administration. Pt states her L wrist is hurting where her staples are. Pt denies any other physical symptoms. Pt denies si/hi/ah/vh and verbally agrees to approach staff if these become apparent or before harming herself/others while at [facility]. Pt was moved for a DNA on [address] hall. A: pt provided support and encouragement. Pt given medication per protocol and standing orders. Q69m safety checks implemented and continued.  R: pt safe on the unit. Will continue to monitor.   Pt progressing in the following metrics  Problem: Education: Goal: Knowledge of [place] [facility] Education information/materials will improve Outcome: Progressing Goal: Emotional status will improve Outcome: Progressing Goal: Mental status will improve Outcome: Progressing Goal: Verbalization of understanding the information provided will improve Outcome: Progressing

## 2018-10-27 NOTE — [facility] Group Notes (Signed)
  [facility] LCSW Group Therapy Note  Date/Time:  10/27/2018 11:15AM-12:00PM  Type of Therapy and Topic:  Group Therapy:  Feelings About Hospitalization  Participation Level:  Did Not Attend   Description of Group This process group involved patients discussing their feelings related to being hospitalized, as well as the benefits they see to being in the hospital.  These feelings and benefits were itemized.  The group then brainstormed specific ways in which they could seek those same benefits when they discharge and return home.  An emphasis was placed on the difference now with the social distancing mandates in place and how to overcome those.  Therapeutic Goals 1. Patient will identify and describe positive and negative feelings related to hospitalization 2. Patient will verbalize benefits of hospitalization to themselves personally 3. Patients will brainstorm together ways they can obtain similar benefits in the outpatient setting, identify barriers to wellness and possible solutions  Summary of Patient Progress:  The patient was invited to attend, chose not to do so  Therapeutic Modalities Cognitive Behavioral Therapy Motivational Interviewing    Ambrose Mantle, LCSW 10/27/2018, 1:05 PM

## 2018-10-27 NOTE — Progress Notes (Signed)
[facility] MD Progress Note  10/27/2018 9:50 AM [name]  MRN:  [id number] Subjective: In bed poorly cooperative in group and individual work thus far denies wanting to harm self but vague thoughts of not wanting to be here no meaningful plans made for discharge.  No cravings tremors or withdrawal symptoms Principal Problem: Self-harm resulting in hospitalization Diagnosis: Active Problems:   Schizoaffective disorder, bipolar type (HCC)   MDD (major depressive disorder), recurrent severe, without psychosis (HCC)  Total Time spent with patient: 20 minutes  Past Medical History:  Past Medical History:  Diagnosis Date  . ADHD (attention deficit hyperactivity disorder)   . Anxiety   . Anxiety disorder   . Constipation   . Depression   . Mental disorder   . Personality disorder (HCC)   . Substance abuse [facility])     Past Surgical History:  Procedure Laterality Date  . CESAREAN SECTION    . FLEXIBLE SIGMOIDOSCOPY N/A 07/06/2014   Procedure: FLEXIBLE SIGMOIDOSCOPY;  Surgeon: Florencia Reasons, MD;  Location: [facility] ENDOSCOPY;  Service: Endoscopy;  Laterality: N/A;  . HERNIA REPAIR     Family History:  Family History  Problem Relation Age of Onset  . Schizophrenia Mother   . Alcoholism Brother   . Hypertension Maternal Grandfather   . Diabetes Maternal Grandfather   . Alcohol abuse Neg Hx   . Arthritis Neg Hx   . Asthma Neg Hx   . Birth defects Neg Hx   . Cancer Neg Hx   . COPD Neg Hx   . Depression Neg Hx   . Drug abuse Neg Hx   . Early death Neg Hx   . Hearing loss Neg Hx   . Heart disease Neg Hx   . Hyperlipidemia Neg Hx   . Kidney disease Neg Hx   . Learning disabilities Neg Hx   . Mental illness Neg Hx   . Mental retardation Neg Hx   . Miscarriages / Stillbirths Neg Hx   . Stroke Neg Hx   . Vision loss Neg Hx   . Varicose Veins Neg Hx    Social History:  Social History   Substance and Sexual Activity  Alcohol Use Yes  . Alcohol/week: 2.0 standard drinks  . Types:  2 Cans of beer per week   Comment: Per pt 1 pint of liquor daily     Social History   Substance and Sexual Activity  Drug Use Yes  . Types: Cocaine, "Crack" cocaine, Marijuana    Social History   Socioeconomic History  . Marital status: Single    Spouse name: Not on file  . Number of children: 3  . Years of education: Not on file  . Highest education level: Not on file  Occupational History  . Not on file  Social Needs  . Financial resource strain: Not on file  . Food insecurity:    Worry: Not on file    Inability: Not on file  . Transportation needs:    Medical: Not on file    Non-medical: Not on file  Tobacco Use  . Smoking status: Current Every Day Smoker    Packs/day: 1.00    Types: Cigarettes  . Smokeless tobacco: Never Used  Substance and Sexual Activity  . Alcohol use: Yes    Alcohol/week: 2.0 standard drinks    Types: 2 Cans of beer per week    Comment: Per pt 1 pint of liquor daily  . Drug use: Yes    Types: Cocaine, "Crack"  cocaine, Marijuana  . Sexual activity: Yes    Birth control/protection: None  Lifestyle  . Physical activity:    Days per week: Not on file    Minutes per session: Not on file  . Stress: Not on file  Relationships  . Social connections:    Talks on phone: Not on file    Gets together: Not on file    Attends religious service: Not on file    Active member of club or organization: Not on file    Attends meetings of clubs or organizations: Not on file    Relationship status: Not on file  Other Topics Concern  . Not on file  Social History Narrative  . Not on file   Additional Social History:                         Sleep: Good  Appetite:  Good  Current Medications: Current Facility-Administered Medications  Medication Dose Route Frequency Provider Last Rate Last Dose  . acetaminophen (TYLENOL) tablet 650 mg  650 mg Oral Q6H PRN Laveda Abbe, NP   650 mg at 10/26/18 1959  . alum & mag hydroxide-simeth  (MAALOX/MYLANTA) 200-200-20 MG/5ML suspension 30 mL  30 mL Oral Q4H PRN Laveda Abbe, NP      . FLUoxetine (PROZAC) capsule 20 mg  20 mg Oral Daily Laveda Abbe, NP   20 mg at 10/26/18 0906  . gabapentin (NEURONTIN) capsule 300 mg  300 mg Oral TID Laveda Abbe, NP   300 mg at 10/26/18 1723  . hydrOXYzine (ATARAX/VISTARIL) tablet 25 mg  25 mg Oral TID PRN Laveda Abbe, NP      . magnesium hydroxide (MILK OF MAGNESIA) suspension 30 mL  30 mL Oral Daily PRN Laveda Abbe, NP      . mirtazapine (REMERON) tablet 15 mg  15 mg Oral QHS Malvin Johns, MD   15 mg at 10/26/18 2000  . nicotine polacrilex (NICORETTE) gum 2 mg  2 mg Oral PRN Laveda Abbe, NP      . OLANZapine [facility]) tablet 5 mg  5 mg Oral BID Malvin Johns, MD   5 mg at 10/26/18 1959  . thiamine (VITAMIN B-1) tablet 100 mg  100 mg Oral Daily Otho Bellows, RPH   100 mg at 10/26/18 2992   Or  . thiamine (B-1) injection 100 mg  100 mg Intravenous Daily Otho Bellows, RPH      . traZODone (DESYREL) tablet 50 mg  50 mg Oral QHS PRN Laveda Abbe, NP        Lab Results: No results found for this or any previous visit (from the past 48 hour(s)).  Blood Alcohol level:  Lab Results  Component Value Date   ETH <10 10/25/2018   ETH <10 09/01/2018    Metabolic Disorder Labs: Lab Results  Component Value Date   HGBA1C 5.0 06/26/2018   MPG 96.8 06/26/2018   MPG 100 08/07/2014   Lab Results  Component Value Date   PROLACTIN 33.2 (H) 06/16/2015   Lab Results  Component Value Date   CHOL 168 06/26/2018   TRIG 36 06/26/2018   HDL 83 06/26/2018   CHOLHDL 2.0 06/26/2018   VLDL 7 06/26/2018   LDLCALC 78 06/26/2018   LDLCALC 50 08/07/2014    Physical Findings: AIMS: Facial and Oral Movements Muscles of Facial Expression: None, normal Lips and Perioral Area: None, normal Jaw: None, normal  Tongue: None, normal,Extremity Movements Upper (arms, wrists, hands, fingers):  None, normal Lower (legs, knees, ankles, toes): None, normal, Trunk Movements Neck, shoulders, hips: None, normal, Overall Severity Severity of abnormal movements (highest score from questions above): None, normal Incapacitation due to abnormal movements: None, normal Patient's awareness of abnormal movements (rate only patient's report): No Awareness, Dental Status Current problems with teeth and/or dentures?: No Does patient usually wear dentures?: No  CIWA:    COWS:     Musculoskeletal: Strength & Muscle Tone: within normal limits Gait & Station: normal Patient leans: N/A  Psychiatric Specialty Exam: Physical Exam  ROS  Blood pressure 124/84, pulse 81, temperature 98.4 F (36.9 C), temperature source Oral, resp. rate 17, height 5\' 5"  (1.651 m), weight 74.8 kg, SpO2 99 %.Body mass index is 27.46 kg/m.  General Appearance: Disheveled  Eye Contact:  Minimal  Speech:  Slow  Volume:  Decreased  Mood:  Depressed and Dysphoric  Affect:  Constricted  Thought Process:  Coherent  Orientation:  Full (Time, Place, and Person)  Thought Content:  Logical  Suicidal Thoughts:  Yes.  without intent/plan  Homicidal Thoughts:  No  Memory:  Immediate;   Fair  Judgement:  Fair  Insight:  Fair  Psychomotor Activity:  Decreased  Concentration:  Concentration: Fair  Recall:  [employer] of Knowledge:  Fair  Language:  Fair  Akathisia:  Negative  Handed:  Right  AIMS (if indicated):     Assets:  Leisure Time Physical Health Resilience  ADL's:  Intact  Cognition:  WNL  Sleep:  Number of Hours: 5.75     Treatment Plan Summary: Daily contact with patient to assess and evaluate symptoms and progress in treatment, Medication management and Plan Continue current precautions continue cognitive-based therapy probable discharge Monday seeking housing at present  [facility], MD 10/27/2018, 9:50 AM

## 2018-10-27 NOTE — Progress Notes (Addendum)
Tamekia did not attend wrap-up group. Pt is resting in bed with eyes closed. Respirations even and unlabored. Writer woke Pt up for assessment and bedtime meds; Pt denies SI/HI/Pain at this time. +AVH. Pt remians isolative to room and proceeded to go back to sleep. Support offered and encouragement offered. Will continue with POC.

## 2018-10-28 DIAGNOSIS — F250 Schizoaffective disorder, bipolar type: Principal | ICD-10-CM

## 2018-10-28 MED ORDER — TRAZODONE HCL 50 MG PO TABS
50.0000 mg | ORAL_TABLET | Freq: Once | ORAL | Status: AC
  Administered 2018-10-28: 21:00:00 50 mg via ORAL
  Filled 2018-10-28 (×2): qty 1

## 2018-10-28 NOTE — [facility] Group Notes (Signed)
[facility] Group Notes: (Clinical Social Work)   10/28/2018      Type of Therapy:  Group Therapy   Participation Level:  Did Not Attend - was invited both individually by MHT and by overhead announcement   Ambrose Mantle, LCSW 10/28/2018, 1:04 PM

## 2018-10-28 NOTE — Progress Notes (Signed)
[facility] MD Progress Note  10/28/2018 1:50 PM Cathie Bonnell  MRN:  [id number] Subjective: Patient reports feeling better.  Currently does not endorse medication side effects.  Denies suicidal ideations at present.  Focused on being discharged soon. Objective: I have reviewed chart notes and have met with patient.  28 year old female, presented to hospital for depression and following self inflicted cuts to wrist.  She also reported auditory hallucinations on admission.  Admission UDS was positive for cocaine.  History of prior psychiatric admissions, most recently in February 2020 for depression and cocaine abuse.  Today patient presents alert, attentive, slightly irritable and dysphoric.  She reports she is feeling better than she did on admission and currently is denying suicidal ideations.  Denies any hallucinations and does not currently present internally preoccupied.  She is focused on discharging soon.  As per nursing notes patient presents with a flat affect and limited milieu participation.  She denies medication side effects-is currently on Remeron, Prozac, Zyprexa, Neurontin. No agitated behaviors on unit  Principal Problem: Self-harm resulting in hospitalization Diagnosis: Active Problems:   Schizoaffective disorder, bipolar type ([place])   MDD (major depressive disorder), recurrent severe, without psychosis ([place])  Total Time spent with patient: 20 minutes  Past Medical History:  Past Medical History:  Diagnosis Date  . ADHD (attention deficit hyperactivity disorder)   . Anxiety   . Anxiety disorder   . Constipation   . Depression   . Mental disorder   . Personality disorder ([place])   . Substance abuse [facility])     Past Surgical History:  Procedure Laterality Date  . CESAREAN SECTION    . FLEXIBLE SIGMOIDOSCOPY N/A 07/06/2014   Procedure: FLEXIBLE SIGMOIDOSCOPY;  Surgeon: Cleotis Nipper, MD;  Location: [facility] ENDOSCOPY;  Service: Endoscopy;  Laterality: N/A;  . HERNIA REPAIR      Family History:  Family History  Problem Relation Age of Onset  . Schizophrenia Mother   . Alcoholism Brother   . Hypertension Maternal Grandfather   . Diabetes Maternal Grandfather   . Alcohol abuse Neg Hx   . Arthritis Neg Hx   . Asthma Neg Hx   . Birth defects Neg Hx   . Cancer Neg Hx   . COPD Neg Hx   . Depression Neg Hx   . Drug abuse Neg Hx   . Early death Neg Hx   . Hearing loss Neg Hx   . Heart disease Neg Hx   . Hyperlipidemia Neg Hx   . Kidney disease Neg Hx   . Learning disabilities Neg Hx   . Mental illness Neg Hx   . Mental retardation Neg Hx   . Miscarriages / Stillbirths Neg Hx   . Stroke Neg Hx   . Vision loss Neg Hx   . Varicose Veins Neg Hx    Social History:  Social History   Substance and Sexual Activity  Alcohol Use Yes  . Alcohol/week: 2.0 standard drinks  . Types: 2 Cans of beer per week   Comment: Per pt 1 pint of liquor daily     Social History   Substance and Sexual Activity  Drug Use Yes  . Types: Cocaine, "Crack" cocaine, Marijuana    Social History   Socioeconomic History  . Marital status: Single    Spouse name: Not on file  . Number of children: 3  . Years of education: Not on file  . Highest education level: Not on file  Occupational History  . Not on file  Social Needs  . Financial resource strain: Not on file  . Food insecurity:    Worry: Not on file    Inability: Not on file  . Transportation needs:    Medical: Not on file    Non-medical: Not on file  Tobacco Use  . Smoking status: Current Every Day Smoker    Packs/day: 1.00    Types: Cigarettes  . Smokeless tobacco: Never Used  Substance and Sexual Activity  . Alcohol use: Yes    Alcohol/week: 2.0 standard drinks    Types: 2 Cans of beer per week    Comment: Per pt 1 pint of liquor daily  . Drug use: Yes    Types: Cocaine, "Crack" cocaine, Marijuana  . Sexual activity: Yes    Birth control/protection: None  Lifestyle  . Physical activity:    Days per  week: Not on file    Minutes per session: Not on file  . Stress: Not on file  Relationships  . Social connections:    Talks on phone: Not on file    Gets together: Not on file    Attends religious service: Not on file    Active member of club or organization: Not on file    Attends meetings of clubs or organizations: Not on file    Relationship status: Not on file  Other Topics Concern  . Not on file  Social History Narrative  . Not on file   Additional Social History:   Sleep: Improving  Appetite:  Good  Current Medications: Current Facility-Administered Medications  Medication Dose Route Frequency Provider Last Rate Last Dose  . acetaminophen (TYLENOL) tablet 650 mg  650 mg Oral Q6H PRN Ethelene Hal, NP   650 mg at 10/27/18 2347  . alum & mag hydroxide-simeth (MAALOX/MYLANTA) 200-200-20 MG/5ML suspension 30 mL  30 mL Oral Q4H PRN Ethelene Hal, NP      . FLUoxetine (PROZAC) capsule 20 mg  20 mg Oral Daily Ethelene Hal, NP   20 mg at 10/28/18 0926  . gabapentin (NEURONTIN) capsule 300 mg  300 mg Oral TID Ethelene Hal, NP   300 mg at 10/28/18 1248  . hydrOXYzine (ATARAX/VISTARIL) tablet 25 mg  25 mg Oral TID PRN Ethelene Hal, NP      . magnesium hydroxide (MILK OF MAGNESIA) suspension 30 mL  30 mL Oral Daily PRN Ethelene Hal, NP      . mirtazapine (REMERON) tablet 15 mg  15 mg Oral QHS Johnn Hai, MD   15 mg at 10/27/18 2351  . nicotine polacrilex (NICORETTE) gum 2 mg  2 mg Oral PRN Ethelene Hal, NP      . OLANZapine [facility]) tablet 5 mg  5 mg Oral BID Johnn Hai, MD   5 mg at 10/28/18 7062  . thiamine (VITAMIN B-1) tablet 100 mg  100 mg Oral Daily Minda Ditto, RPH   100 mg at 10/28/18 3762   Or  . thiamine (B-1) injection 100 mg  100 mg Intravenous Daily Minda Ditto, RPH      . traZODone (DESYREL) tablet 50 mg  50 mg Oral QHS PRN Ethelene Hal, NP        Lab Results: No results found for this or  any previous visit (from the past 47 hour(s)).  Blood Alcohol level:  Lab Results  Component Value Date   [facility] <10 10/25/2018   ETH <10 83/15/1761    Metabolic Disorder Labs: Lab Results  Component Value Date   HGBA1C 5.0 06/26/2018   MPG 96.8 06/26/2018   MPG 100 08/07/2014   Lab Results  Component Value Date   PROLACTIN 33.2 (H) 06/16/2015   Lab Results  Component Value Date   CHOL 168 06/26/2018   TRIG 36 06/26/2018   HDL 83 06/26/2018   CHOLHDL 2.0 06/26/2018   VLDL 7 06/26/2018   LDLCALC 78 06/26/2018   LDLCALC 50 08/07/2014    Physical Findings: AIMS: Facial and Oral Movements Muscles of Facial Expression: None, normal Lips and Perioral Area: None, normal Jaw: None, normal Tongue: None, normal,Extremity Movements Upper (arms, wrists, hands, fingers): None, normal Lower (legs, knees, ankles, toes): None, normal, Trunk Movements Neck, shoulders, hips: None, normal, Overall Severity Severity of abnormal movements (highest score from questions above): None, normal Incapacitation due to abnormal movements: None, normal Patient's awareness of abnormal movements (rate only patient's report): No Awareness, Dental Status Current problems with teeth and/or dentures?: No Does patient usually wear dentures?: No  CIWA:    COWS:     Musculoskeletal: Strength & Muscle Tone: within normal limits Gait & Station: normal Patient leans: N/A  Psychiatric Specialty Exam: Physical Exam  ROS reports nasal congestion which she states is chronic.  Denies fever or chills, denies shortness of breath or coughing  Blood pressure (!) 140/94, pulse 91, temperature 98.4 F (36.9 C), temperature source Oral, resp. rate 17, height _0  (1.651 m), weight 74.8 kg, SpO2 99 %.Body mass index is 27.46 kg/m.  General Appearance: Fairly Groomed  Eye Contact:  Fair  Speech:  Normal Rate  Volume:  Normal  Mood:  Reports she is feeling better, presents vaguely dysphoric  Affect:  Congruent  and Restricted  Thought Process:  Linear and Descriptions of Associations: Intact  Orientation:  Other:  Fully alert and attentive  Thought Content:  At this time denies hallucinations and does not appear internally preoccupied.  No delusions are expressed  Suicidal Thoughts:  No today denies suicidal ideations and presents future oriented, states she is looking forward to discharge   Homicidal Thoughts:  No  Memory:  Recent and remote fair  Judgement:  Fair  Insight:  Fair  Psychomotor Activity:  Normal  Concentration:  Concentration: Fair and Attention Span: Fair  Recall:  [employer] of Knowledge:  Fair  Language:  Fair  Akathisia:  Negative  Handed:  Right  AIMS (if indicated):     Assets:  Leisure Time Physical Health Resilience  ADL's:  Intact  Cognition:  WNL  Sleep:  Number of Hours: 6.5   Assessment:  28 year old female, presented to hospital for depression and following self inflicted cuts to wrist.  She also reported auditory hallucinations on admission.  Admission UDS was positive for cocaine.  History of prior psychiatric admissions, most recently in February 2020 for depression and cocaine abuse.  Today patient reports some improvement compared to admission.  Currently denies suicidal ideations.  Denies psychotic symptoms and does not appear internally preoccupied.  She is hopeful for discharge soon.  She presents vaguely depressed and dysphoric.  She is tolerating medications well/denies side effects.  Treatment Plan Summary: Daily contact with patient to assess and evaluate symptoms and progress in treatment, Medication management and Plan Continue current precautions continue cognitive-based therapy probable discharge Monday seeking housing at present Treatment plan reviewed as below today 4/5 Encourage group and milieu participation to work on coping skills and symptom reduction Encourage efforts to work on sobriety/relapse prevention Continue Prozac 20 mg  daily for  depression  Continue Zyprexa 5 mg twice daily for mood disorder/psychosis Continue Remeron 15 mg nightly for depression/insomnia Continue Neurontin 300 mg 3 times daily for anxiety Discontinue trazodone (as on Zyprexa and Remeron) We will recheck BMP in a.m. to follow-up on [facility] Treatment team working on disposition planning options Jenne Campus, MD 10/28/2018, 1:50 PM   Patient ID: Leary Roca, female   DOB: [birth year], 28 y.o.   MRN: [id number]

## 2018-10-28 NOTE — Plan of Care (Addendum)
D: Patient in bed awake on approach. Patient is cooperative, affect is flat, mood is depressed. Interaction is minimal. Denies SI, HI, AVH, and verbally contracts for safety. Patient reports pain in right wrist at staples. She also reports waking up a lot at night.  Patient requests trazodone to help with sleep. One time order obtained from on call provider.    A: Medications administered per MD order. Support provided. Patient educated on safety on the unit and medications. Routine safety checks every 15 minutes. Patient stated understanding to tell nurse about any new physical symptoms. Patient understands to tell staff of any needs.     R: No adverse drug reactions noted. Patient verbally contracts for safety. Patient remains safe at this time and will continue to monitor.   Problem: Medication: Goal: Compliance with prescribed medication regimen will improve Outcome: Progressing   Patient was willing to take medication as prescribed this shift. Remains safe and will continue to monitor.

## 2018-10-28 NOTE — Progress Notes (Signed)
Pt presents with a flat affect and a depressed mood. Pt rated on her self inventory sheet: depression 5/10, anxiety 9, hopelessness 8. Pt denies SI/HI. Pt reported difficulty sleeping last night due to racing thoughts. Pt denied AVH. Pt appears to be withdrawn and noted to be isolative to her room. Pt noted to be guarded and forwards little information. Pt have poor hygiene and appears disheveled.   Medications reviewed with pt and education provided. Verbal support provided. Pt encouraged to attend groups. 15 minute checks performed for safety. Pt encouraged to perform ADLs.  Pt compliant with taking medications and denies any side effects to meds.

## 2018-10-29 LAB — BASIC METABOLIC PANEL
Anion gap: 10 (ref 5–15)
BUN: 11 mg/dL (ref 6–20)
CO2: 22 mmol/L (ref 22–32)
Calcium: 8.5 mg/dL — ABNORMAL LOW (ref 8.9–10.3)
Chloride: 107 mmol/L (ref 98–111)
Creatinine, Ser: 0.72 mg/dL (ref 0.44–1.00)
GFR calc Af Amer: >60 mL/min (ref >60)
GFR calc non Af Amer: >60 mL/min (ref >60)
Glucose, Bld: 127 mg/dL — ABNORMAL HIGH (ref 70–99)
Potassium: 4 mmol/L (ref 3.5–5.1)
Sodium: 139 mmol/L (ref 135–145)

## 2018-10-29 MED ORDER — FLUOXETINE HCL 20 MG PO CAPS: 20.0000 mg | ORAL_CAPSULE | Freq: Every day | ORAL | 2 refills | Status: AC

## 2018-10-29 MED ORDER — DIPHENHYDRAMINE HCL 50 MG/ML IJ SOLN
INTRAMUSCULAR | Status: AC
  Administered 2018-10-29: 11:00:00 50 mg
  Filled 2018-10-29: qty 1

## 2018-10-29 MED ORDER — LORAZEPAM 2 MG/ML IJ SOLN
1.0000 mg | Freq: Once | INTRAMUSCULAR | Status: AC
  Administered 2018-10-29: 11:00:00 1 mg via INTRAMUSCULAR

## 2018-10-29 MED ORDER — MIRTAZAPINE 15 MG PO TABS: 15.0000 mg | ORAL_TABLET | Freq: Every day | ORAL | 1 refills | Status: AC

## 2018-10-29 MED ORDER — DIPHENHYDRAMINE HCL 50 MG/ML IJ SOLN
50.0000 mg | Freq: Once | INTRAMUSCULAR | Status: AC
  Filled 2018-10-29: qty 1

## 2018-10-29 MED ORDER — LORAZEPAM 2 MG/ML IJ SOLN
INTRAMUSCULAR | Status: AC
  Filled 2018-10-29: qty 1

## 2018-10-29 MED ORDER — DIPHENHYDRAMINE HCL 25 MG PO CAPS
25.0000 mg | ORAL_CAPSULE | Freq: Two times a day (BID) | ORAL | Status: DC
  Administered 2018-10-29: 25 mg via ORAL
  Filled 2018-10-29 (×5): qty 1

## 2018-10-29 MED ORDER — FLUOXETINE HCL 10 MG PO CAPS
10.0000 mg | ORAL_CAPSULE | Freq: Every day | ORAL | Status: DC
  Filled 2018-10-29 (×2): qty 1

## 2018-10-29 NOTE — Progress Notes (Signed)
Psychoeducational Group Note  Date:  10/29/2018 Time:  1310  Group Topic/Focus:  Developing a Wellness Toolbox:   The focus of this group is to help patients develop a "wellness toolbox" with skills and strategies to promote recovery upon discharge.  Participation Level: Did Not Attend  Participation Quality:  Not Applicable  Affect:  Not Applicable  Cognitive:  Not Applicable  Insight:  Not Applicable  Engagement in Group: Not Applicable  Additional Comments:  Pt stated "I am leaving today and don't want to go to group, I went to the other group last night."  Ashlea Dusing E 10/29/2018, 1:39 PM
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
## 2018-10-29 NOTE — Progress Notes (Signed)
[facility]  10/29/2018 11:09 AM Olivia RoesStanlesha Santos  MRN:  [id number] Subjective:    Patient seen she is highly anxious today she has had somewhat of a dystonic reaction that her tongue is drawing back but it is not swollen she states "my tongue is swelling up" and she is having difficulty speaking but again it is not particularly swollen so much as it is having a dystonic pulling and she is trying to pull it forward with her hands. Speaks to the fragility of the patient's mood state this med reaction has caused her to be highly anxious and not quite unconsolable but she is reassured but it takes a great deal of time to reassure her.  We will go and administer Benadryl and Ativan and then order Benadryl and discontinue antipsychotics and lower fluoxetine.  Just to be on the safe side discontinue Neurontin for now though it does not look like an allergic reaction will hold off on gabapentin Principal Problem: Chronic issues with cocaine abuse and difficulty with her boyfriend and boyfriend's mother Diagnosis: Active Problems:   Schizoaffective disorder, bipolar type (HCC)   MDD (major depressive disorder), recurrent severe, without psychosis (HCC)  Total Time spent with patient: 30 minutes  Past Medical History:  Past Medical History:  Diagnosis Date  . ADHD (attention deficit hyperactivity disorder)   . Anxiety   . Anxiety disorder   . Constipation   . Depression   . Mental disorder   . Personality disorder (HCC)   . Substance abuse [facility](HCC)     Past Surgical History:  Procedure Laterality Date  . CESAREAN SECTION    . FLEXIBLE SIGMOIDOSCOPY N/A 07/06/2014   Procedure: FLEXIBLE SIGMOIDOSCOPY;  Surgeon: Florencia Reasonsobert Buccini V, MD;  [facility];  Service: Endoscopy;  Laterality: N/A;  . HERNIA REPAIR     Family History:  Family History  Problem Relation Age of Onset  . Schizophrenia Mother   . Alcoholism Brother   . Hypertension Maternal Grandfather   . Diabetes Maternal  Grandfather   . Alcohol abuse Neg Hx   . Arthritis Neg Hx   . Asthma Neg Hx   . Birth defects Neg Hx   . Cancer Neg Hx   . COPD Neg Hx   . Depression Neg Hx   . Drug abuse Neg Hx   . Early death Neg Hx   . Hearing loss Neg Hx   . Heart disease Neg Hx   . Hyperlipidemia Neg Hx   . Kidney disease Neg Hx   . Learning disabilities Neg Hx   . Mental illness Neg Hx   . Mental retardation Neg Hx   . Miscarriages / Stillbirths Neg Hx   . Stroke Neg Hx   . Vision loss Neg Hx   . Varicose Veins Neg Hx   Social History:  Social History   Substance and Sexual Activity  Alcohol Use Yes  . Alcohol/week: 2.0 standard drinks  . Types: 2 Cans of beer per week   Comment: Per pt 1 pint of liquor daily     Social History   Substance and Sexual Activity  Drug Use Yes  . Types: Cocaine, "Crack" cocaine, Marijuana    Social History   Socioeconomic History  . Marital status: Single    Spouse name: Not on file  . Number of children: 3  . Years of education: Not on file  . Highest education level: Not on file  Occupational History  . Not on file  Social Needs  . Financial resource strain: Not on file  . Food insecurity:    Worry: Not on file    Inability: Not on file  . Transportation needs:    Medical: Not on file    Non-medical: Not on file  Tobacco Use  . Smoking status: Current Every Day Smoker    Packs/day: 1.00    Types: Cigarettes  . Smokeless tobacco: Never Used  Substance and Sexual Activity  . Alcohol use: Yes    Alcohol/week: 2.0 standard drinks    Types: 2 Cans of beer per week    Comment: Per pt 1 pint of liquor daily  . Drug use: Yes    Types: Cocaine, "Crack" cocaine, Marijuana  . Sexual activity: Yes    Birth control/protection: None  Lifestyle  . Physical activity:    Days per week: Not on file    Minutes per session: Not on file  . Stress: Not on file  Relationships  . Social connections:    Talks on phone: Not on file    Gets together: Not on file     Attends religious service: Not on file    Active member of club or organization: Not on file    Attends meetings of clubs or organizations: Not on file    Relationship status: Not on file  Other Topics Concern  . Not on file  Social History Narrative  . Not on file   Additional Social History:                         Sleep: Fair  Appetite:  Fair  Current Medications: Current Facility-Administered Medications  Medication Dose Route Frequency Provider Last Rate Last Dose  . acetaminophen (TYLENOL) tablet 650 mg  650 mg Oral Q6H PRN Laveda Abbe, NP   650 mg at 10/28/18 2120  . alum & mag hydroxide-simeth (MAALOX/MYLANTA) 200-200-20 MG/5ML suspension 30 mL  30 mL Oral Q4H PRN Laveda Abbe, NP      . diphenhydrAMINE (BENADRYL) capsule 25 mg  25 mg Oral BID Malvin Johns, MD      . diphenhydrAMINE (BENADRYL) injection 50 mg  50 mg Intramuscular Once Malvin Johns, MD      . Melene Muller ON 10/30/2018] FLUoxetine (PROZAC) capsule 10 mg  10 mg Oral Daily Malvin Johns, MD      . hydrOXYzine (ATARAX/VISTARIL) tablet 25 mg  25 mg Oral TID PRN Laveda Abbe, NP      . LORazepam (ATIVAN) injection 1 mg  1 mg Intramuscular Once Malvin Johns, MD      . magnesium hydroxide (MILK OF MAGNESIA) suspension 30 mL  30 mL Oral Daily PRN Laveda Abbe, NP      . mirtazapine (REMERON) tablet 15 mg  15 mg Oral QHS Cobos, Rockey Situ, MD   15 mg at 10/28/18 2120  . nicotine polacrilex (NICORETTE) gum 2 mg  2 mg Oral PRN Laveda Abbe, NP      . thiamine (VITAMIN B-1) tablet 100 mg  100 mg Oral Daily Otho Bellows, RPH   100 mg at 10/29/18 8295   Or  . thiamine (B-1) injection 100 mg  100 mg Intravenous Daily Otho Bellows, [facility]        Lab Results:  Results for orders placed or performed during the hospital encounter of 10/25/18 (from the past 48 hour(s))  Basic metabolic panel     Status: Abnormal   Collection  Time: 10/29/18  6:43 AM  Result Value Ref Range    Sodium 139 135 - 145 mmol/L   Potassium 4.0 3.5 - 5.1 mmol/L   Chloride 107 98 - 111 mmol/L   CO2 22 22 - 32 mmol/L   Glucose, Bld 127 (H) 70 - 99 mg/dL   BUN 11 6 - 20 mg/dL   Creatinine, Ser 4.68 0.44 - 1.00 mg/dL   Calcium 8.5 (L) 8.9 - 10.3 mg/dL   GFR calc non Af Amer >60 >60 mL/min   GFR calc Af Amer >60 >60 mL/min   Anion gap 10 5 - 15    Comment: Performed at [facility], [address] [place], [place] [address]    Blood Alcohol level:  Lab Results  Component Value Date   ETH <10 10/25/2018   ETH <10 09/01/2018    Metabolic Disorder Labs: Lab Results  Component Value Date   HGBA1C 5.0 06/26/2018   MPG 96.8 06/26/2018   MPG 100 08/07/2014   Lab Results  Component Value Date   PROLACTIN 33.2 (H) 06/16/2015   Lab Results  Component Value Date   CHOL 168 06/26/2018   TRIG 36 06/26/2018   HDL 83 06/26/2018   CHOLHDL 2.0 06/26/2018   VLDL 7 06/26/2018   LDLCALC 78 06/26/2018   LDLCALC 50 08/07/2014    Physical Findings: AIMS: Facial and Oral Movements Muscles of Facial Expression: None, normal Lips and Perioral Area: None, normal Jaw: None, normal Tongue: None, normal,Extremity Movements Upper (arms, wrists, hands, fingers): None, normal Lower (legs, knees, ankles, toes): None, normal, Trunk Movements Neck, shoulders, hips: None, normal, Overall Severity Severity of abnormal movements (highest score from questions above): None, normal Incapacitation due to abnormal movements: None, normal Patient's awareness of abnormal movements (rate only patient's report): No Awareness, Dental Status Current problems with teeth and/or dentures?: No Does patient usually wear dentures?: No  CIWA:    COWS:     Musculoskeletal: Strength & Muscle Tone: within normal limits Gait & Station: normal Patient leans: N/A  Psychiatric Specialty Exam: Physical Exam  ROS  Blood pressure 117/71, pulse 98, temperature 98.4 F (36.9 C), temperature  source Oral, resp. rate 16, height 5\' 5"  (1.651 m), weight 74.8 kg, SpO2 99 %.Body mass index is 27.46 kg/m.  General Appearance: Disheveled  Eye Contact:  Minimal  Speech:  Garbled  Volume:  Increased  Mood:  labile  Affect:  Labile  Thought Process:  Coherent  Orientation:  Full (Time, Place, and Person)  Thought Content:  Tangential  Suicidal Thoughts:  No  Homicidal Thoughts:  No  Memory:  Immediate;   Fair  Judgement:  Fair  Insight:  Fair  Psychomotor Activity:  Restlessness  Concentration:  Concentration: Poor overall  Recall:  [employer] of Knowledge:  Fair  Language:  Good  Akathisia:  Yes more specifically acute dystonia of the tongue  Handed:  Right  AIMS (if indicated):     Assets:  Physical Health Resilience  ADL's:  Intact  Cognition:  WNL  Sleep:  Number of Hours: 6.25     Treatment Plan Summary: Daily contact with patient to assess and evaluate symptoms and progress in treatment, Medication management and Plan As above reassured probable discharge tomorrow continue cognitive therapy continue reality therapy also discontinue antipsychotics add Benadryl standing continue Benadryl IM orders  Raschelle Wisenbaker, MD 10/29/2018, 11:09 AM

## 2018-10-29 NOTE — Progress Notes (Signed)
Patient ID: [name], female   DOB: 1990/09/07, 28 y.o.   MRN: [id number]  D: Pt alert and oriented on the unit.   A: Education, support, and encouragement provided. Discharge summary, medications and follow up appointments reviewed with pt. Suicide prevention resources provided, including "My 3 App." Pt's belongings in locker # 26 returned and belongings sheet signed.  R: Pt denies SI/HI, A/VH, pain, or any concerns at this time. Pt ambulatory on and off unit. Pt discharged to lobby.

## 2018-10-29 NOTE — [facility] Suicide Risk Assessment (Signed)
[facility] Discharge Suicide Risk Assessment   Principal Problem: Cocaine relapse exacerbation underlying mood disorder Discharge Diagnoses: Active Problems:   Schizoaffective disorder, bipolar type (HCC)   MDD (major depressive disorder), recurrent severe, without psychosis (HCC) Despite having a difficult morning patient is insistent upon discharge denies having thoughts of harming self or others  Total Time spent with patient: 45 minutes Mental Status Per Nursing Assessment::   On Admission:  Self-harm behaviors  Demographic Factors:  Low socioeconomic status  Loss Factors: Loss of significant relationship  Historical Factors: Impulsivity  Risk Reduction Factors:   Sense of responsibility to family and Religious beliefs about death  Continued Clinical Symptoms:  Alcohol/Substance Abuse/Dependencies  Cognitive Features That Contribute To Risk:  Polarized thinking    Suicide Risk:  Minimal: No identifiable suicidal ideation.  Patients presenting with no risk factors but with morbid ruminations; may be classified as minimal risk based on the severity of the depressive symptoms  Follow-up Information    [facility] Follow up.   Why:  ACTT Services will continue for patient after discharge.  Please call ACTT coordinator, Lauren to get a medication management appointment.  Contact information: [address]  [place] [address] Phone: [phone] Fax: [phone]          Malvin Johns, MD 10/29/2018, 2:00 PM

## 2019-01-19 ENCOUNTER — Ambulatory Visit (HOSPITAL_COMMUNITY)
Admission: EM | Admit: 2019-01-19 | Discharge: 2019-01-19 | Disposition: A | Discharge status: Home / Self Care | Payer: Medicaid Other | Attending: Family Medicine | Admitting: Family Medicine

## 2019-01-19 ENCOUNTER — Encounter (HOSPITAL_COMMUNITY): Payer: Self-pay | Admitting: Emergency Medicine

## 2019-01-19 ENCOUNTER — Other Ambulatory Visit: Payer: Self-pay

## 2019-01-19 DIAGNOSIS — Z113 Encounter for screening for infections with a predominantly sexual mode of transmission: Secondary | ICD-10-CM | POA: Diagnosis present

## 2019-01-19 DIAGNOSIS — R197 Diarrhea, unspecified: Secondary | ICD-10-CM | POA: Insufficient documentation

## 2019-01-19 DIAGNOSIS — R109 Unspecified abdominal pain: Secondary | ICD-10-CM

## 2019-01-19 DIAGNOSIS — L259 Unspecified contact dermatitis, unspecified cause: Secondary | ICD-10-CM | POA: Diagnosis present

## 2019-01-19 DIAGNOSIS — N644 Mastodynia: Secondary | ICD-10-CM

## 2019-01-19 DIAGNOSIS — R112 Nausea with vomiting, unspecified: Secondary | ICD-10-CM | POA: Diagnosis present

## 2019-01-19 LAB — POCT URINALYSIS DIP (DEVICE)
Bilirubin Urine: NEGATIVE
Glucose, UA: NEGATIVE mg/dL
Hgb urine dipstick: NEGATIVE
Ketones, ur: NEGATIVE mg/dL
Leukocytes,Ua: NEGATIVE
Nitrite: NEGATIVE
Protein, ur: NEGATIVE mg/dL
Specific Gravity, Urine: >=1.03 (ref 1.005–1.030)
Urobilinogen, UA: 0.2 mg/dL (ref 0.0–1.0)
pH: 5 (ref 5.0–8.0)

## 2019-01-19 LAB — POCT PREGNANCY, URINE: Preg Test, Ur: NEGATIVE

## 2019-01-19 MED ORDER — METRONIDAZOLE 500 MG PO TABS: 500.0000 mg | ORAL_TABLET | Freq: Two times a day (BID) | ORAL | 0 refills | Status: AC

## 2019-01-19 MED ORDER — ONDANSETRON 4 MG PO TBDP: 4.0000 mg | ORAL_TABLET | Freq: Three times a day (TID) | ORAL | 0 refills | Status: AC | PRN

## 2019-01-19 MED ORDER — TRIAMCINOLONE ACETONIDE 0.1 % EX CREA: 1.0000 "application " | TOPICAL_CREAM | Freq: Two times a day (BID) | CUTANEOUS | 0 refills | Status: AC

## 2019-01-19 NOTE — ED Provider Notes (Signed)
[facility]    CSN: [id number] Arrival date & time: 01/19/19  1641      History   Chief Complaint Chief Complaint  Patient presents with  . Abdominal Pain  . Breast Pain    HPI [name] is a 28 y.o. female.   HPI  Patient presents today with multiple complaints including abdominal pain, one episode of this morning.  Also complains of associated bilateral breast with clear drainage. Breast drainage has been ongoing for more than 4 months. Patient has also been 4 months. She is uncertain of possibility of pregnancy. She has had unprotected sex within the last few months. She also complains of rash on her chest and face extremities. Uncertain of any known irritant. She reports a history of eczema. She is without a PCP.   Patient reports recently testing hepatitis C. Patient has history of substance abuse. She was diagnosed at hospital in [place]. She also reports experiencing a smelly vaginal discharge. She was also diagnosed with bacterial vaginosis, although never started the metronidazole. Requests a refill of medication as she never previously filled.   Past Medical History:  Diagnosis Date  . ADHD (attention deficit hyperactivity disorder)   . Anxiety   . Anxiety disorder   . Constipation   . Depression   . Mental disorder   . Personality disorder (HCC)   . Substance abuse [facility](HCC)     Patient Active Problem List   Diagnosis Date Noted  . MDD (major depressive disorder), recurrent severe, without psychosis (HCC) 10/25/2018  . Severe recurrent major depression without psychotic features (HCC) 06/26/2018  . MDD (major depressive disorder), recurrent, severe, with psychosis (HCC) 10/31/2017  . Hyperprolactinemia (HCC) 06/17/2015  . Cocaine use disorder (HCC) 06/15/2015  . Schizoaffective disorder, bipolar type (HCC) 06/15/2015  . Cocaine use disorder, severe, dependence (HCC) 06/15/2015  . Alcohol use disorder, moderate, dependence (HCC) 06/15/2015  .  PTSD (post-traumatic stress disorder) 06/15/2015  . Cannabis abuse 06/15/2015  . Rectal ulceration 07/08/2014  . Rectal bleeding 07/05/2014  . Rectal bleed 07/05/2014    Past Surgical History:  Procedure Laterality Date  . CESAREAN SECTION    . FLEXIBLE SIGMOIDOSCOPY N/A 07/06/2014   Procedure: FLEXIBLE SIGMOIDOSCOPY;  Surgeon: Florencia Reasonsobert Buccini V, MD;  [facility];  Service: Endoscopy;  Laterality: N/A;  . HERNIA REPAIR      OB History    Gravida  3   Para  3   Term      Preterm  3   AB      Living  3     SAB      TAB      Ectopic      Multiple      Live Births               Home Medications    Prior to Admission medications   Medication Sig Start Date End Date Taking? Authorizing Provider  FLUoxetine (PROZAC) 20 MG capsule Take 1 capsule (20 mg total) by mouth daily. 10/30/18   Malvin JohnsFarah, Brian, MD  mirtazapine (REMERON) 15 MG tablet Take 1 tablet (15 mg total) by mouth at bedtime. 10/29/18   Malvin JohnsFarah, Brian, MD    Family History Family History  Problem Relation Age of Onset  . Schizophrenia Mother   . Alcoholism Brother   . Hypertension Maternal Grandfather   . Diabetes Maternal Grandfather   . Alcohol abuse Neg Hx   . Arthritis Neg Hx   . Asthma Neg Hx   .  Birth defects Neg Hx   . Cancer Neg Hx   . COPD Neg Hx   . Depression Neg Hx   . Drug abuse Neg Hx   . Early death Neg Hx   . Hearing loss Neg Hx   . Heart disease Neg Hx   . Hyperlipidemia Neg Hx   . Kidney disease Neg Hx   . Learning disabilities Neg Hx   . Mental illness Neg Hx   . Mental retardation Neg Hx   . Miscarriages / Stillbirths Neg Hx   . Stroke Neg Hx   . Vision loss Neg Hx   . Varicose Veins Neg Hx     Social History Social History   Tobacco Use  . Smoking status: Current Every Day Smoker    Packs/day: 1.00    Types: Cigarettes  . Smokeless tobacco: Never Used  Substance Use Topics  . Alcohol use: Yes    Alcohol/week: 2.0 standard drinks    Types: 2 Cans of  beer per week    Comment: Per pt 1 pint of liquor daily  . Drug use: Yes    Types: Cocaine, "Crack" cocaine, Marijuana     Allergies   Perphenazine, Latex, Haldol [haloperidol lactate], and Risperdal [risperidone]   Review of Systems Review of Systems Pertinent negatives listed in HPI Physical Exam Triage Vital Signs ED Triage Vitals  Enc Vitals Group     BP 01/19/19 1727 109/67     Pulse Rate 01/19/19 1727 66     Resp 01/19/19 1727 18     Temp 01/19/19 1727 98.1 F (36.7 C)     Temp Source 01/19/19 1727 Oral     SpO2 01/19/19 1727 99 %     Weight --      Height --      Head Circumference --      Peak Flow --      Pain Score 01/19/19 1728 6     Pain Loc --      Pain Edu? --      Excl. in GC? --    No data found.  Updated Vital Signs BP 109/67 (BP Location: Right Arm)   Pulse 66   Temp 98.1 F (36.7 C) (Oral)   Resp 18   SpO2 99%   Visual Acuity Right Eye Distance:   Left Eye Distance:   Bilateral Distance:    Right Eye Near:   Left Eye Near:    Bilateral Near:     Physical Exam General appearance: alert, well developed, well nourished, cooperative and in no distress Head: Normocephalic, without obvious abnormality, atraumatic Respiratory: Respirations even and unlabored, normal respiratory rate Heart: rate and rhythm normal. No gallop or murmurs noted on exam  Abdomen: BS +, no distention, no rebound tenderness, or no mass Extremities: No gross deformities Skin: Skin color, texture, turgor normal. No rashes seen  Psych: Flat affect  Neurologic:Alert, oriented to person, place, and time UC Treatments / Results  Labs (all labs ordered are listed, but only abnormal results are displayed) Labs Reviewed  POC URINE PREG, ED  POCT URINALYSIS DIP (DEVICE)  POCT PREGNANCY, URINE  CERVICOVAGINAL ANCILLARY ONLY   EKG None  Radiology No results found.  Procedures Procedures (including critical care time)  Medications Ordered in UC Medications - No  data to display  Initial Impression / Assessment and Plan / UC Course  I have reviewed the triage vital signs and the nursing notes.  Pertinent labs & imaging results that were  available during my care of the patient were reviewed by me and considered in my medical decision making (see chart for details).   Patient present today with multiple complaints. HCG urine negative and UA unremarkable.  Will obtain vaginal specimen to screen for STD.  Will treat empirically for bacterial vaginosis as patient was previously diagnosed and never completed treatment.  Patient was given the option of metronidazole tablets or MetroGel.  Patient reports she is not drinking alcohol at present and would prefer the pills.  Patient was advised extensively not to drink while taking medication or difficulty severe illness.  Also advised patient given recent diagnosis of hepatitis C she will need to follow-up with infectious disease for treatment.  Contact information for infectious disease included in after summary visit.  She is without a PCP she has been given resources to get established with a primary care provider as she is also needs further follow-up on missed cycle and breast pain.  Treating as symptoms symptomatically.  Patient prescribed Zofran for nausea.  Patient also prescribed triamcinolone cream for what I suspect is dermatitis.  Discussed red flags that with immediate return for follow-up evaluation.  Patient endorsed understanding and agreement with today's plan. Final Clinical Impressions(s) / UC Diagnoses   Final diagnoses:  Abdominal pain, unspecified abdominal location  Nausea vomiting and diarrhea  Contact dermatitis, unspecified contact dermatitis type, unspecified trigger  Breast pain  Screen for STD (sexually transmitted disease)   Discharge Instructions   None    ED Prescriptions    Medication Sig Dispense Auth. Provider   triamcinolone cream (KENALOG) 0.1 % Apply 1 application topically 2  (two) times daily. 454 g Scot Jun, FNP   metroNIDAZOLE (FLAGYL) 500 MG tablet Take 1 tablet (500 mg total) by mouth 2 (two) times daily with a meal. DO NOT CONSUME ALCOHOL WHILE TAKING THIS MEDICATION. 14 tablet Scot Jun, FNP   ondansetron (ZOFRAN ODT) 4 MG disintegrating tablet Take 1 tablet (4 mg total) by mouth every 8 (eight) hours as needed for nausea or vomiting. 20 tablet Scot Jun, FNP     Controlled Substance Prescriptions [place] Controlled Substance Registry consulted? Not Applicable   Scot Jun, FNP 01/20/19 0221

## 2019-01-19 NOTE — ED Triage Notes (Signed)
Pt sts abd pain and breast pain; pt sts no period x 4 months; pt sts just found out she is positive for hep C

## 2019-01-22 LAB — CERVICOVAGINAL ANCILLARY ONLY
Bacterial vaginitis: POSITIVE — AB
Candida vaginitis: NEGATIVE
Chlamydia: NEGATIVE
Neisseria Gonorrhea: NEGATIVE
Trichomonas: NEGATIVE

## 2021-03-18 ENCOUNTER — Inpatient Hospital Stay
Admit: 2021-03-18 | Discharge: 2021-03-22 | Disposition: A | Discharge status: Home / Self Care | Payer: PRIVATE HEALTH INSURANCE | Source: Other Acute Inpatient Hospital | Attending: Psychiatry | Admitting: Psychiatry

## 2021-03-18 ENCOUNTER — Inpatient Hospital Stay
Admit: 2021-03-18 | Discharge: 2021-03-18 | Disposition: A | Discharge status: Other Acute Inpatient Hospital | Payer: PRIVATE HEALTH INSURANCE | Attending: Emergency Medicine

## 2021-03-18 ENCOUNTER — Emergency Department: Admit: 2021-03-18 | Payer: PRIVATE HEALTH INSURANCE

## 2021-03-18 DIAGNOSIS — O26891 Other specified pregnancy related conditions, first trimester: Secondary | ICD-10-CM

## 2021-03-18 DIAGNOSIS — F39 Unspecified mood [affective] disorder: Secondary | ICD-10-CM

## 2021-03-18 LAB — DRUG SCREEN, URINE
AMPHETAMINES: POSITIVE — AB
Amphetamine Screen, Urine: POSITIVE — AB
BARBITURATES: NEGATIVE
BENZODIAZEPINES: NEGATIVE
Barbiturate Screen, Urine: NEGATIVE
Benzodiazepine Screen, Urine: NEGATIVE
COCAINE: POSITIVE — AB
Cocaine Screen Urine: POSITIVE — AB
METHADONE: NEGATIVE
Methadone Screen, Urine: NEGATIVE
OPIATES: NEGATIVE
Opiate Screen, Urine: NEGATIVE
PCP Screen, Urine: NEGATIVE
PCP(PHENCYCLIDINE): NEGATIVE
THC (TH-CANNABINOL): NEGATIVE
THC Screen, Urine: NEGATIVE

## 2021-03-18 LAB — URINALYSIS W/MICROSCOPIC
Blood: NEGATIVE
Glucose: NEGATIVE mg/dL
Ketone: 15 mg/dL — AB
Leukocyte Esterase: NEGATIVE
Nitrites: NEGATIVE
Protein: 30 mg/dL — AB
Specific gravity: >1.03 — ABNORMAL HIGH (ref 1.003–1.030)
Urobilinogen: 1 EU/dL (ref 0.2–1.0)
pH (UA): 6 (ref 5.0–8.0)

## 2021-03-18 LAB — METABOLIC PANEL, COMPREHENSIVE
A-G Ratio: 1.3 (ref 1.1–2.2)
ALT (SGPT): 20 U/L (ref 10–35)
AST (SGOT): 20 U/L (ref 10–35)
Albumin: 3.9 g/dL (ref 3.5–5.2)
Alk. phosphatase: 80 U/L (ref 35–104)
Anion gap: 10 mmol/L (ref 5–15)
BUN/Creatinine ratio: 13 (ref 12–20)
BUN: 8 MG/DL (ref 6–20)
Bilirubin, total: 0.5 MG/DL (ref 0.2–1.0)
CO2: 25 mmol/L (ref 22–29)
Calcium: 9.1 MG/DL (ref 8.6–10.0)
Chloride: 101 mmol/L (ref 98–107)
Creatinine: 0.62 MG/DL (ref 0.50–0.90)
GFR est AA: >60 mL/min/{1.73_m2} (ref >60)
GFR est non-AA: >60 mL/min/{1.73_m2} (ref >60)
Globulin: 3 g/dL (ref 2.0–4.0)
Glucose: 89 mg/dL (ref 65–100)
Potassium: 3.1 mmol/L — ABNORMAL LOW (ref 3.5–5.1)
Protein, total: 6.9 g/dL (ref 6.4–8.3)
Sodium: 136 mmol/L (ref 136–145)

## 2021-03-18 LAB — URINE CULTURE HOLD SAMPLE

## 2021-03-18 LAB — CBC WITH AUTOMATED DIFF
ABS. BASOPHILS: 0.1 10*3/uL (ref 0.0–0.1)
ABS. EOSINOPHILS: 0.2 10*3/uL (ref 0.0–0.4)
ABS. IMM. GRANS.: 0 10*3/uL (ref 0.00–0.04)
ABS. LYMPHOCYTES: 2.5 10*3/uL (ref 0.8–3.5)
ABS. MONOCYTES: 0.4 10*3/uL (ref 0.0–1.0)
ABS. NEUTROPHILS: 3.5 10*3/uL (ref 1.8–8.0)
ABSOLUTE NRBC: 0 10*3/uL (ref 0.00–0.01)
BASOPHILS: 1 % (ref 0–1)
EOSINOPHILS: 3 % (ref 0–7)
HCT: 31.9 % — ABNORMAL LOW (ref 35.0–47.0)
HGB: 11.1 g/dL — ABNORMAL LOW (ref 11.5–16.0)
IMMATURE GRANULOCYTES: 0 % (ref 0.0–0.5)
LYMPHOCYTES: 37 % (ref 12–49)
MCH: 29.6 PG (ref 26.0–34.0)
MCHC: 34.8 g/dL (ref 30.0–36.5)
MCV: 85.1 FL (ref 80.0–99.0)
MONOCYTES: 6 % (ref 5–13)
MPV: 10.1 FL (ref 8.9–12.9)
NEUTROPHILS: 52 % (ref 32–75)
NRBC: 0 PER 100 WBC
PLATELET: 325 10*3/uL (ref 150–400)
RBC: 3.75 M/uL — ABNORMAL LOW (ref 3.80–5.20)
RDW: 13.7 % (ref 11.5–14.5)
WBC: 6.7 10*3/uL (ref 3.6–11.0)

## 2021-03-18 LAB — ETHYL ALCOHOL
ALCOHOL(ETHYL),SERUM: <10 MG/DL (ref 0–10)
Ethyl Alcohol: <10 MG/DL (ref 0–10)

## 2021-03-18 LAB — BETA HCG, QT
Beta HCG, QT: 102762 m[IU]/mL — ABNORMAL HIGH (ref <7)
hCG Quant: 102762 m[IU]/mL — ABNORMAL HIGH (ref <7)

## 2021-03-18 LAB — COVID-19 WITH INFLUENZA A/B
Influenza A By PCR: NOT DETECTED
Influenza A by PCR: NOT DETECTED
Influenza B By PCR: NOT DETECTED
Influenza B by PCR: NOT DETECTED
SARS-CoV-2 by PCR: NOT DETECTED
SARS-CoV-2: NOT DETECTED

## 2021-03-18 LAB — LIPASE
Lipase: 15 U/L (ref 13–60)
Lipase: 15 U/L (ref 13–60)

## 2021-03-18 LAB — POTASSIUM
Potassium: 3.5 mmol/L (ref 3.5–5.1)
Potassium: 3.5 mmol/L (ref 3.5–5.1)

## 2021-03-18 LAB — BILIRUBIN, CONFIRM: Bilirubin UA, confirm: NEGATIVE

## 2021-03-18 LAB — HCG URINE, QL. - POC
HCG, Pregnancy, Urine, POC: POSITIVE — AB
Pregnancy test,urine (POC): POSITIVE — AB

## 2021-03-18 LAB — BILIRUBIN, CONFIRMATORY: Bilirubin Urine: NEGATIVE

## 2021-03-18 LAB — COMPREHENSIVE METABOLIC PANEL
ALT: 20 U/L (ref 10–35)
AST: 20 U/L (ref 10–35)
Albumin/Globulin Ratio: 1.3 (ref 1.1–2.2)
Albumin: 3.9 g/dL (ref 3.5–5.2)
Alkaline Phosphatase: 80 U/L (ref 35–104)
Anion Gap: 10 mmol/L (ref 5–15)
BUN: 8 MG/DL (ref 6–20)
Bun/Cre Ratio: 13 (ref 12–20)
CO2: 25 mmol/L (ref 22–29)
Calcium: 9.1 MG/DL (ref 8.6–10.0)
Chloride: 101 mmol/L (ref 98–107)
Creatinine: 0.62 MG/DL (ref 0.50–0.90)
EGFR IF NonAfrican American: >60 mL/min/{1.73_m2} (ref >60)
GFR African American: >60 mL/min/{1.73_m2} (ref >60)
Globulin: 3 g/dL (ref 2.0–4.0)
Glucose: 89 mg/dL (ref 65–100)
Potassium: 3.1 mmol/L — ABNORMAL LOW (ref 3.5–5.1)
Sodium: 136 mmol/L (ref 136–145)
Total Bilirubin: 0.5 MG/DL (ref 0.2–1.0)
Total Protein: 6.9 g/dL (ref 6.4–8.3)

## 2021-03-18 LAB — CBC WITH AUTO DIFFERENTIAL
Basophils %: 1 % (ref 0–1)
Basophils Absolute: 0.1 10*3/uL (ref 0.0–0.1)
Eosinophils %: 3 % (ref 0–7)
Eosinophils Absolute: 0.2 10*3/uL (ref 0.0–0.4)
Granulocyte Absolute Count: 0 10*3/uL (ref 0.00–0.04)
Hematocrit: 31.9 % — ABNORMAL LOW (ref 35.0–47.0)
Hemoglobin: 11.1 g/dL — ABNORMAL LOW (ref 11.5–16.0)
Immature Granulocytes: 0 % (ref 0.0–0.5)
Lymphocytes %: 37 % (ref 12–49)
Lymphocytes Absolute: 2.5 10*3/uL (ref 0.8–3.5)
MCH: 29.6 PG (ref 26.0–34.0)
MCHC: 34.8 g/dL (ref 30.0–36.5)
MCV: 85.1 FL (ref 80.0–99.0)
MPV: 10.1 FL (ref 8.9–12.9)
Monocytes %: 6 % (ref 5–13)
Monocytes Absolute: 0.4 10*3/uL (ref 0.0–1.0)
NRBC Absolute: 0 10*3/uL (ref 0.00–0.01)
Neutrophils %: 52 % (ref 32–75)
Neutrophils Absolute: 3.5 10*3/uL (ref 1.8–8.0)
Nucleated RBCs: 0 PER 100 WBC
Platelets: 325 10*3/uL (ref 150–400)
RBC: 3.75 M/uL — ABNORMAL LOW (ref 3.80–5.20)
RDW: 13.7 % (ref 11.5–14.5)
WBC: 6.7 10*3/uL (ref 3.6–11.0)

## 2021-03-18 LAB — URINALYSIS WITH MICROSCOPIC
Blood, Urine: NEGATIVE
Glucose, Ur: NEGATIVE mg/dL
Ketones, Urine: 15 mg/dL — AB
Leukocyte Esterase, Urine: NEGATIVE
Nitrite, Urine: NEGATIVE
Protein, UA: 30 mg/dL — AB
Specific Gravity, UA: >1.03 NA — ABNORMAL HIGH (ref 1.003–1.030)
Urobilinogen, UA, POCT: 1 EU/dL (ref 0.2–1.0)
pH, UA: 6 (ref 5.0–8.0)

## 2021-03-18 MED ORDER — POTASSIUM CHLORIDE 10 MEQ/100 ML IV PIGGY BACK
10 mEq/0 mL | Freq: Once | INTRAVENOUS | Status: AC
Start: 2021-03-18 — End: 2021-03-18
  Administered 2021-03-18: 18:00:00 via INTRAVENOUS

## 2021-03-18 MED ORDER — POTASSIUM CHLORIDE SR 10 MEQ TAB
10 mEq | ORAL | Status: AC
Start: 2021-03-18 — End: 2021-03-18
  Administered 2021-03-18: 17:00:00 via ORAL

## 2021-03-18 MED FILL — POTASSIUM CHLORIDE SR 10 MEQ TAB: 10 mEq | ORAL | Qty: 6

## 2021-03-18 MED FILL — POTASSIUM CHLORIDE 10 MEQ/100 ML IV PIGGY BACK: 10 mEq/0 mL | INTRAVENOUS | Qty: 100

## 2021-03-18 NOTE — ED Notes (Signed)
Patient does not appear to be in any acute distress/shows no evidence of clinical instability at this time.     Provider has reviewed discharge instructions Pt going to [facility] by [facility]

## 2021-03-18 NOTE — ED Notes (Signed)
 TRANSFER - OUT REPORT:    Verbal report given to Ulanda Kitty (name) on Oralia Criger  being transferred to [facility] behavioral health(unit) for routine progression of care       Report consisted of patient's Situation, Background, Assessment and   Recommendations(SBAR).     Information from the following report(s) SBAR, ED Summary, [facility] and Recent Results was reviewed with the receiving nurse.    Lines:       Opportunity for questions and clarification was provided.      Patient transported with:   [facility]
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
## 2021-03-18 NOTE — ED Notes (Signed)
PT left with [facility] to [facility]

## 2021-03-18 NOTE — Behavioral Health Treatment Team (Signed)
Admission unit:Acute    Received from:  [facility]    Admission diagnosis: Schizoaffective d/o    Admission status: Voluntary     Mood/ affect/ thought process / behaviors: Patient hostile and non compliant during admission assessment. Eventually patient complied with hospital policy that wig with sharp pins was not allowed on the unit. Patient was internally responding and still expressing HI towards everyone and was placed in psych 2. Patient was assessed by [facility] and agreed to stay voluntarily. Patient's mood became calm and looked forward to receiving help during stay.     Alcohol/drug: ETOH neg, UDS+ cocaine and amphetamine      PT or consults required: wound consult, H+P    Primary Nurse Augustin Coupe, RN and Tommy Rainwater, RN performed a dual skin assessment on this patient Impairment noted- see wound doc flow sheet (Sutures to L wrist)  Braden score is 23

## 2021-03-19 MED ORDER — PRENATAL VIT NO.95-FERROUS FUMARATE 28 MG-FOLIC ACID 800 MCG TABLET
28 mg iron- 800 mcg | Freq: Every day | ORAL | Status: DC
Start: 2021-03-19 — End: 2021-03-22
  Administered 2021-03-19 – 2021-03-22 (×4): via ORAL

## 2021-03-19 MED ORDER — ACETAMINOPHEN 325 MG TABLET
325 mg | ORAL | Status: DC | PRN
Start: 2021-03-19 — End: 2021-03-22
  Administered 2021-03-21: 14:00:00 via ORAL

## 2021-03-19 MED FILL — PRENATAL 28 MG IRON-800 MCG TABLET: 28 mg iron- 800 mcg | ORAL | Qty: 1

## 2021-03-19 NOTE — Progress Notes (Signed)
Penny Cruz [facility]  Master Treatment Plan [name]        Date Treatment Plan Initiated: 03/19/21      Treatment Plan Modalities:    Type of Modality Amount  (x minutes) Frequency (x/week) Duration (x days) Name of Responsible Staff   Community & wrap-up meetings to encourage peer interactions    15    7    1      SHAWN, BHT   Group psychotherapy to assist in building coping skills and internal controls    60    7    1    KATE  LCSW   Therapeutic activity groups to build coping skills    60    7    1    KATEr LCSW   Psychoeducation in group setting to address:   Medication education    15    7    1     Sherene Sires,  RN   Coping skills    20    7    1     SHAWN, BHT   Relaxation techniques        BRITTANY BHT   Symptom management        STAFF   Discharge planning    15    7    1     KATE, Child psychotherapist   Spirituality     60    7    1    Chaplain Bob   NAMI    60    7    1    Volunteer from [employer]   Recovery/AA/NA    60    7    1    Volunteer from [employer]   Physician medication management    15    7    1     Dr. Zenovia Jarred NP   Family meeting/discharge planning                           Problem: Altered Thought Process (Adult/Pediatric)  Goal: *STG: Remains safe in hospital  Outcome: Progressing Towards Goal  Pt denies any suicidal or homicidal thoughts. Contracts for safety. Remains on q 15 min safety checks.     Goal: *STG: Seeks staff when feelings of anxiety and fear arise  Outcome: Not Progressing Towards Goal  Isolative ,poor eye contact and minimal self disclosure  Goal: *STG: Attends activities and groups  Outcome: Not Progressing Towards Goal  Declines attendance  Goal: *STG: Decreased hallucinations  Outcome: Not Progressing Towards Goal  Observed responding to internal stimuli

## 2021-03-19 NOTE — Progress Notes (Signed)
Patient received resting quietly in bed. No signs of distress. Even and unlabored breathing. Staff will continue to monitor safety q15 and provide support.     Problem: Falls - Risk of  Goal: *Absence of Falls  Description: Document Schmid Fall Risk and appropriate interventions in the flowsheet.  Outcome: Progressing Towards Goal  Note: Fall Risk Interventions:    Medication Interventions: Teach patient to arise slowly

## 2021-03-19 NOTE — Progress Notes (Signed)
Problem: Suicide  Goal: *STG:  Verbalizes alternative ways of dealing with maladaptive feelings/behaviors  Outcome: Resolved/Met     Problem: Patient Education: Go to Patient Education Activity  Goal: Patient/Family Education  Outcome: Resolved/Met     Problem: Suicide  Goal: *STG: Remains safe in hospital  Outcome: Resolved/Met  Goal: *STG: Seeks staff when feelings of self harm or harm towards others arise  Outcome: Resolved/Met  Goal: *STG: Attends activities and groups  Outcome: Resolved/Met  Goal: *STG:  Verbalizes alternative ways of dealing with maladaptive feelings/behaviors  Outcome: Resolved/Met  Goal: *STG/LTG: Complies with medication therapy  Outcome: Resolved/Met  Goal: *STG/LTG: No longer expresses self destructive or suicidal thoughts  Outcome: Resolved/Met  Goal: *LTG:  Identifies available community resources  Outcome: Resolved/Met  Goal: *LTG:  Develops proactive suicide prevention plan  Outcome: Resolved/Met  Goal: Interventions  Outcome: Resolved/Met

## 2021-03-19 NOTE — Progress Notes (Signed)
Pt out in milieu for small periods of time. Quiet and isolative to room. Meal compliant.   Problem: Falls - Risk of  Goal: *Absence of Falls  Description: Document Penny Cruz Fall Risk and appropriate interventions in the flowsheet.  Outcome: Progressing Towards Goal  Note: Fall Risk Interventions:       Medication Interventions: Teach patient to arise slowly       Problem: Patient Education: Go to Patient Education Activity  Goal: Patient/Family Education  Outcome: Progressing Towards Goal

## 2021-03-20 MED ORDER — FLUOXETINE 10 MG CAP
10 mg | Freq: Every day | ORAL | Status: DC
Start: 2021-03-20 — End: 2021-03-22
  Administered 2021-03-20: 21:00:00 via ORAL

## 2021-03-20 MED FILL — FLUOXETINE 10 MG CAP: 10 mg | ORAL | Qty: 1

## 2021-03-20 MED FILL — PRENATAL 28 MG IRON-800 MCG TABLET: 28 mg iron- 800 mcg | ORAL | Qty: 1

## 2021-03-20 NOTE — Behavioral Health Treatment Team (Signed)
Wigs examined by this Clinical research associate, as Dr. Lennox Grumbles asked Corrie Dandy, RN to examine wigs to determine if they are safe for patient to wear, as patient had asked Dr. Lennox Grumbles if she could have her wigs. All wigs contain metal bobby pin looking clips. Dr. Lennox Grumbles told Corrie Dandy that patient may not have any of her wigs due to them being unsafe.

## 2021-03-20 NOTE — Progress Notes (Signed)
Problem: Falls - Risk of  Goal: *Absence of Falls  Description: Document Schmid Fall Risk and appropriate interventions in the flowsheet.  Outcome: Progressing Towards Goal  Note: Fall Risk Interventions:            Medication Interventions: Teach patient to arise slowly    Elimination Interventions: Toilet paper/wipes in reach    Resting in bed with eyes closed, no complaints, no distress noted.  Safety measures in place, will continue to monitor.

## 2021-03-20 NOTE — Behavioral Health Treatment Team (Signed)
Chief Complaint:  "I am down."    Length of Stay: 2 Days    Interval History:  Nursing report that patient had no acute events overnight. No PRNs given.  She slept well and has been eating all her meals.    Pt says that she has to get a doctor's order to wear her wig. She says that she doesn't want to hurt anyone or herself with her wig. She says that she just wants to feel like herself. She says that follows up with [facility]. She experienced dystonic reactions with risperdal, haldol, latuda.    Patient says that she struggled with depression.    Past Medical History:  No past medical history on file.        Labs:  Lab Results   Component Value Date/Time    WBC 6.7 03/18/2021 11:17 AM    HGB 11.1 (L) 03/18/2021 11:17 AM    HCT 31.9 (L) 03/18/2021 11:17 AM    PLATELET 325 03/18/2021 11:17 AM    MCV 85.1 03/18/2021 11:17 AM      Lab Results   Component Value Date/Time    Sodium 136 03/18/2021 11:17 AM    Potassium 3.5 03/18/2021 02:58 PM    Chloride 101 03/18/2021 11:17 AM    CO2 25 03/18/2021 11:17 AM    Anion gap 10 03/18/2021 11:17 AM    Glucose 89 03/18/2021 11:17 AM    BUN 8 03/18/2021 11:17 AM    Creatinine 0.62 03/18/2021 11:17 AM    BUN/Creatinine ratio 13 03/18/2021 11:17 AM    GFR est AA >60 03/18/2021 11:17 AM    GFR est non-AA >60 03/18/2021 11:17 AM    Calcium 9.1 03/18/2021 11:17 AM    Bilirubin, total 0.5 03/18/2021 11:17 AM    Alk. phosphatase 80 03/18/2021 11:17 AM    Protein, total 6.9 03/18/2021 11:17 AM    Albumin 3.9 03/18/2021 11:17 AM    Globulin 3.0 03/18/2021 11:17 AM    A-G Ratio 1.3 03/18/2021 11:17 AM    ALT (SGPT) 20 03/18/2021 11:17 AM      Vitals:    03/18/21 1838 03/19/21 0930 03/19/21 1540 03/20/21 1124   BP:   105/62 106/72   Pulse: 79  75 64   Resp: _0 Temp:   98.3 ??F (36.8 ??C) 98.4 ??F (36.9 ??C)   SpO2: 99%  98% 98%         Current Facility-Administered Medications   Medication Dose Route Frequency Provider Last Rate Last Admin    acetaminophen (TYLENOL)  tablet 650 mg  650 mg Oral Q4H PRN Omer Jack, NP        prenatal vitamin tablet 1 Tablet  1 Tablet Oral DAILY Omer Jack, NP   1 Tablet at 03/20/21 0913         Mental Status Exam:  30yo AAF is in fair grooming is engaged in my evaluation.  Eye contact: fair  Grooming: fair  Psychomotor activity:   Speech is spontaneous  Mood is "kinda messed up"  Affect:dysthymic  Perception: denies  Suicidal ideation: denies  Cognition is grossly intact         Physical Exam:  Body habitus: There is no height or weight on file to calculate BMI.  Musculoskeletal system: normal gait  Tremor - neg  Cog wheeling - neg      Assessment and Plan:  Leary Roca meets criteria for a diagnosis of    Unspecified mood  disorder.  Stimulant use disorder, amphetamine type substance.  Rule out malingering.    Provided education about SSRIs during pregnancy and their safety. Patient gave informed consent to start prozac for depression.  Patient may have her wig to wear.    Continue the medication regimen as prescribed  Disposition planning to continue.   A coordinated, multidisplinary treatment team round was conducted with the patient, nurses, pharmcist, Catering manager present. Discussions held with case manager, and/or with family members; Complete current electronic health record for patient was reviewed in full including consultant notes, ancillary staff notes, nurses and tech notes, labs and vitals.  I certify that this patients inpatient psychiatric hospital services furnished since the previous certification were, and continue to be, required for treatment that could reasonably be expected to improve the patient's condition, or for diagnostic study, and that the patient continues to need, on a daily basis, active treatment furnished directly by or requiring the supervision of inpatient psychiatric facility personnel. In addition, the hospital records show that services furnished were intensive treatment services, admission  or related services, or equivalent services.

## 2021-03-20 NOTE — Progress Notes (Signed)
Patient received resting in bed with eyes closed. NAD and no complaints noted. Safety measures in place. Will continue to monitor with q15min rounds.   Problem: Falls - Risk of  Goal: *Absence of Falls  Description: Document Schmid Fall Risk and appropriate interventions in the flowsheet.  Outcome: Progressing Towards Goal  Note: Fall Risk Interventions:            Medication Interventions: Teach patient to arise slowly

## 2021-03-20 NOTE — Progress Notes (Signed)
Problem: Altered Thought Process (Adult/Pediatric)  Goal: *STG: Participates in treatment plan  Outcome: Progressing Towards Goal     Problem: Altered Thought Process (Adult/Pediatric)  Goal: *STG: Remains safe in hospital  Outcome: Progressing Towards Goal     Problem: Altered Thought Process (Adult/Pediatric)  Goal: *STG: Seeks staff when feelings of anxiety and fear arise  Outcome: Progressing Towards Goal

## 2021-03-21 MED FILL — ACETAMINOPHEN 325 MG TABLET: 325 mg | ORAL | Qty: 2

## 2021-03-21 MED FILL — FLUOXETINE 10 MG CAP: 10 mg | ORAL | Qty: 1

## 2021-03-21 MED FILL — PRENATAL 28 MG IRON-800 MCG TABLET: 28 mg iron- 800 mcg | ORAL | Qty: 1

## 2021-03-21 NOTE — Behavioral Health Treatment Team (Signed)
 TRANSFER - OUT REPORT:    Verbal report given to Kristen S. RN(name) on Penny Cruz  being transferred to [facility](unit) for routine progression of care       Report consisted of patient's Situation, Background, Assessment and   Recommendations(SBAR).     Information from the following report(s) SBAR was reviewed with the receiving nurse.    Lines:       Opportunity for questions and clarification was provided.      Patient transported with:   Registered Nurse

## 2021-03-21 NOTE — Behavioral Health Treatment Team (Signed)
PRN Medication Documentation    Specific patient behavior that led to need for PRN medication: Patient requesting tylenol for 9 out of 10 abdominal pain  Staff interventions attempted prior to PRN being given: distraction  PRN medication given: Tylenol PO PRN  Patient response/effectiveness of PRN medication: Will reassess pain

## 2021-03-21 NOTE — Behavioral Health Treatment Team (Signed)
Chief Complaint:    "The only reason I am here is to get back to [facility]"    Length of Stay: 3 Days    Interval History:  Nursing report that patient had no acute events overnight. No PRNs given.  This morning she refused her prescribed prozac. She took it yesterday.    Patient becomes tearful and says that she is safe and that she doesn't want to harm herself or anyone, that she just wanted to return to [facility] to be with her mother.    Past Medical History:  No past medical history on file.        Labs:  Lab Results   Component Value Date/Time    WBC 6.7 03/18/2021 11:17 AM    HGB 11.1 (L) 03/18/2021 11:17 AM    HCT 31.9 (L) 03/18/2021 11:17 AM    PLATELET 325 03/18/2021 11:17 AM    MCV 85.1 03/18/2021 11:17 AM      Lab Results   Component Value Date/Time    Sodium 136 03/18/2021 11:17 AM    Potassium 3.5 03/18/2021 02:58 PM    Chloride 101 03/18/2021 11:17 AM    CO2 25 03/18/2021 11:17 AM    Anion gap 10 03/18/2021 11:17 AM    Glucose 89 03/18/2021 11:17 AM    BUN 8 03/18/2021 11:17 AM    Creatinine 0.62 03/18/2021 11:17 AM    BUN/Creatinine ratio 13 03/18/2021 11:17 AM    GFR est AA >60 03/18/2021 11:17 AM    GFR est non-AA >60 03/18/2021 11:17 AM    Calcium 9.1 03/18/2021 11:17 AM    Bilirubin, total 0.5 03/18/2021 11:17 AM    Alk. phosphatase 80 03/18/2021 11:17 AM    Protein, total 6.9 03/18/2021 11:17 AM    Albumin 3.9 03/18/2021 11:17 AM    Globulin 3.0 03/18/2021 11:17 AM    A-G Ratio 1.3 03/18/2021 11:17 AM    ALT (SGPT) 20 03/18/2021 11:17 AM      Vitals:    03/20/21 1523 03/20/21 1734 03/20/21 2011 03/21/21 0819   BP: 109/65  103/66 113/66   Pulse: (!) 57  78 66   Resp: '14  16 14   ' Temp: 98.5 ??F (36.9 ??C)  98.4 ??F (36.9 ??C) 98.8 ??F (37.1 ??C)   SpO2: 100%  98% 99%   Weight:  81.6 kg (180 lb)     Height:  '5\' 5"'  (1.651 m)           Current Facility-Administered Medications   Medication Dose Route Frequency Provider Last Rate Last Admin    FLUoxetine (PROzac) capsule 10 mg  10 mg Oral DAILY Kaynen Minner R,  MD   10 mg at 03/20/21 1705    acetaminophen (TYLENOL) tablet 650 mg  650 mg Oral Q4H PRN Omer Jack, NP        prenatal vitamin tablet 1 Tablet  1 Tablet Oral DAILY Omer Jack, NP   1 Tablet at 03/21/21 9629         Mental Status Exam:  30yo AAF is in fair grooming is poorly engaged in my evaluation.  Eye contact: fair  Grooming: fair  Psychomotor activity:   Speech is spontaneous  Mood is "ok"  Affect: labile  Perception: denies  Suicidal ideation: denies  Cognition is grossly intact       Physical Exam:  Body habitus: Body mass index is 29.95 kg/m??.  Musculoskeletal system: normal gait  Tremor - neg  Cog wheeling - neg  Assessment and Plan:  Penny Cruz meets criteria for a diagnosis of    Unspecified mood disorder.  Stimulant use disorder, amphetamine type substance.  Rule out malingering.    Patient refused meds today, but says that she isn't suicidal and wants to go home.    03/20/2021  Provided education about SSRIs during pregnancy and their safety. Patient gave informed consent to start prozac for depression.  Patient may have her wig to wear.    Continue the medication regimen as prescribed  Disposition planning to continue.     A coordinated, multidisplinary treatment team round was conducted with the patient, nurses, pharmcist, Catering manager present. Discussions held with case manager, and/or with family members; Complete current electronic health record for patient was reviewed in full including consultant notes, ancillary staff notes, nurses and tech notes, labs and vitals.  I certify that this patients inpatient psychiatric hospital services furnished since the previous certification were, and continue to be, required for treatment that could reasonably be expected to improve the patient's condition, or for diagnostic study, and that the patient continues to need, on a daily basis, active treatment furnished directly by or requiring the supervision of inpatient psychiatric  facility personnel. In addition, the hospital records show that services furnished were intensive treatment services, admission or related services, or equivalent services.

## 2021-03-22 LAB — EKG, 12 LEAD, INITIAL
Atrial Rate: 73 {beats}/min
Calculated P Axis: 57 degrees
Calculated R Axis: 76 degrees
Calculated T Axis: 55 degrees
Diagnosis: NORMAL
P-R Interval: 124 ms
Q-T Interval: 382 ms
QRS Duration: 82 ms
QTC Calculation (Bezet): 420 ms
Ventricular Rate: 73 {beats}/min

## 2021-03-22 LAB — EKG 12-LEAD
Atrial Rate: 73 {beats}/min
Diagnosis: NORMAL
P Axis: 57 degrees
P-R Interval: 124 ms
Q-T Interval: 382 ms
QRS Duration: 82 ms
QTc Calculation (Bazett): 420 ms
R Axis: 76 degrees
T Axis: 55 degrees
Ventricular Rate: 73 {beats}/min

## 2021-03-22 MED ORDER — PRENATAL VIT NO.95-FERROUS FUMARATE 28 MG-FOLIC ACID 800 MCG TABLET
28 mg iron- 800 mcg | ORAL_TABLET | Freq: Every day | ORAL | 0 refills | Status: AC
Start: 2021-03-22 — End: 2021-04-21

## 2021-03-22 MED FILL — PRENATAL 28 MG IRON-800 MCG TABLET: 28 mg iron- 800 mcg | ORAL | Qty: 1

## 2021-03-22 MED FILL — FLUOXETINE 10 MG CAP: 10 mg | ORAL | Qty: 1

## 2021-03-22 MED FILL — M-NATAL PLUS 27-1MG TABS: 27-1 mg | ORAL | 30 days supply | Qty: 30 | Fill #0 | Status: AC
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
## 2021-03-22 NOTE — Progress Notes (Signed)
Patient received resting quietly in bed. No signs of distress. Even and unlabored breathing. Staff will continue to monitor safety q15 and provide support.      Problem: Falls - Risk of  Goal: *Absence of Falls  Description: Document Schmid Fall Risk and appropriate interventions in the flowsheet.  Outcome: Progressing Towards Goal  Note: Fall Risk Interventions:            Medication Interventions: Teach patient to arise slowly    Elimination Interventions: Toilet paper/wipes in reach
